# Patient Record
Sex: Male | Born: 1940 | Race: White | Hispanic: No | Marital: Married | Smoking: Former smoker
Health system: Southern US, Community
[De-identification: ages and names within clinical notes are randomized; demographics above are authoritative.]

## PROBLEM LIST (undated history)

## (undated) ENCOUNTER — Ambulatory Visit: Admission: EM | Disposition: A | Discharge status: Home / Self Care | Payer: Medicare Other

## (undated) DIAGNOSIS — R0789 Other chest pain: Secondary | ICD-10-CM

## (undated) DIAGNOSIS — I2510 Atherosclerotic heart disease of native coronary artery without angina pectoris: Secondary | ICD-10-CM

## (undated) DIAGNOSIS — Z974 Presence of external hearing-aid: Secondary | ICD-10-CM

## (undated) DIAGNOSIS — I10 Essential (primary) hypertension: Secondary | ICD-10-CM

## (undated) DIAGNOSIS — N529 Male erectile dysfunction, unspecified: Secondary | ICD-10-CM

## (undated) DIAGNOSIS — Z860100 Personal history of colon polyps, unspecified: Secondary | ICD-10-CM

## (undated) DIAGNOSIS — Z8371 Family history of colonic polyps: Secondary | ICD-10-CM

## (undated) DIAGNOSIS — Z951 Presence of aortocoronary bypass graft: Secondary | ICD-10-CM

## (undated) DIAGNOSIS — I679 Cerebrovascular disease, unspecified: Secondary | ICD-10-CM

## (undated) DIAGNOSIS — Z803 Family history of malignant neoplasm of breast: Secondary | ICD-10-CM

## (undated) DIAGNOSIS — Z800 Family history of malignant neoplasm of digestive organs: Secondary | ICD-10-CM

## (undated) DIAGNOSIS — I739 Peripheral vascular disease, unspecified: Secondary | ICD-10-CM

## (undated) DIAGNOSIS — I77810 Thoracic aortic ectasia: Secondary | ICD-10-CM

## (undated) DIAGNOSIS — K219 Gastro-esophageal reflux disease without esophagitis: Secondary | ICD-10-CM

## (undated) DIAGNOSIS — Z83719 Family history of colon polyps, unspecified: Secondary | ICD-10-CM

## (undated) DIAGNOSIS — I482 Chronic atrial fibrillation, unspecified: Secondary | ICD-10-CM

## (undated) DIAGNOSIS — E785 Hyperlipidemia, unspecified: Secondary | ICD-10-CM

## (undated) DIAGNOSIS — G4730 Sleep apnea, unspecified: Secondary | ICD-10-CM

## (undated) DIAGNOSIS — I491 Atrial premature depolarization: Secondary | ICD-10-CM

## (undated) DIAGNOSIS — R011 Cardiac murmur, unspecified: Secondary | ICD-10-CM

## (undated) DIAGNOSIS — E7800 Pure hypercholesterolemia, unspecified: Secondary | ICD-10-CM

## (undated) DIAGNOSIS — G629 Polyneuropathy, unspecified: Secondary | ICD-10-CM

## (undated) DIAGNOSIS — I779 Disorder of arteries and arterioles, unspecified: Secondary | ICD-10-CM

## (undated) DIAGNOSIS — Z972 Presence of dental prosthetic device (complete) (partial): Secondary | ICD-10-CM

## (undated) DIAGNOSIS — I6529 Occlusion and stenosis of unspecified carotid artery: Secondary | ICD-10-CM

## (undated) DIAGNOSIS — I509 Heart failure, unspecified: Secondary | ICD-10-CM

## (undated) HISTORY — PX: COLONOSCOPY W/ POLYPECTOMY: SHX1380

## (undated) HISTORY — DX: Presence of aortocoronary bypass graft: Z95.1

## (undated) HISTORY — DX: Chronic atrial fibrillation, unspecified: I48.20

## (undated) HISTORY — PX: ANGIOPLASTY: SHX39

## (undated) HISTORY — DX: Peripheral vascular disease, unspecified: I73.9

## (undated) HISTORY — DX: Personal history of colonic polyps: Z86.010

## (undated) HISTORY — PX: KNEE ARTHROSCOPY W/ PARTIAL MEDIAL MENISCECTOMY: SHX1882

## (undated) HISTORY — PX: MRI: SHX5353

## (undated) HISTORY — PX: HERPES SIMPLEX VIRUS DFA: LAB15028

## (undated) HISTORY — DX: Family history of colonic polyps: Z83.71

## (undated) HISTORY — DX: Cerebrovascular disease, unspecified: I67.9

## (undated) HISTORY — PX: UVULOPALATOPHARYNGOPLASTY: SHX827

## (undated) HISTORY — DX: Disorder of arteries and arterioles, unspecified: I77.9

## (undated) HISTORY — DX: Essential (primary) hypertension: I10

## (undated) HISTORY — DX: Personal history of colon polyps, unspecified: Z86.0100

## (undated) HISTORY — DX: Other chest pain: R07.89

## (undated) HISTORY — PX: OTHER SURGICAL HISTORY: SHX169

## (undated) HISTORY — DX: Hyperlipidemia, unspecified: E78.5

## (undated) HISTORY — DX: Pure hypercholesterolemia, unspecified: E78.00

## (undated) HISTORY — DX: Family history of malignant neoplasm of breast: Z80.3

## (undated) HISTORY — DX: Thoracic aortic ectasia: I77.810

## (undated) HISTORY — DX: Cardiac murmur, unspecified: R01.1

## (undated) HISTORY — DX: Family history of malignant neoplasm of digestive organs: Z80.0

## (undated) HISTORY — DX: Atherosclerotic heart disease of native coronary artery without angina pectoris: I25.10

## (undated) HISTORY — PX: APPENDECTOMY: SHX54

## (undated) HISTORY — DX: Family history of colon polyps, unspecified: Z83.719

## (undated) HISTORY — DX: Sleep apnea, unspecified: G47.30

## (undated) HISTORY — DX: Gastro-esophageal reflux disease without esophagitis: K21.9

## (undated) HISTORY — DX: Atrial premature depolarization: I49.1

## (undated) HISTORY — DX: Occlusion and stenosis of unspecified carotid artery: I65.29

## (undated) HISTORY — DX: Male erectile dysfunction, unspecified: N52.9

---

## 1996-12-02 HISTORY — PX: CORONARY ARTERY BYPASS GRAFT: SHX141

## 2000-12-02 HISTORY — PX: CARDIAC CATHETERIZATION: SHX172

## 2004-04-01 HISTORY — PX: ESOPHAGOGASTRODUODENOSCOPY: SHX1529

## 2004-10-11 ENCOUNTER — Encounter: Payer: Self-pay | Admitting: Internal Medicine

## 2004-12-28 ENCOUNTER — Ambulatory Visit: Payer: Self-pay | Admitting: Internal Medicine

## 2005-01-28 ENCOUNTER — Ambulatory Visit: Payer: Self-pay | Admitting: Internal Medicine

## 2005-03-26 ENCOUNTER — Ambulatory Visit: Payer: Self-pay | Admitting: Internal Medicine

## 2005-03-27 ENCOUNTER — Ambulatory Visit: Payer: Self-pay | Admitting: Internal Medicine

## 2005-06-27 ENCOUNTER — Ambulatory Visit: Payer: Self-pay | Admitting: Internal Medicine

## 2005-07-01 ENCOUNTER — Ambulatory Visit: Payer: Self-pay | Admitting: Internal Medicine

## 2005-07-11 ENCOUNTER — Ambulatory Visit: Payer: Self-pay | Admitting: Internal Medicine

## 2005-10-14 ENCOUNTER — Ambulatory Visit: Payer: Self-pay | Admitting: Family Medicine

## 2005-11-01 ENCOUNTER — Ambulatory Visit: Payer: Self-pay | Admitting: Internal Medicine

## 2005-11-12 ENCOUNTER — Ambulatory Visit: Payer: Self-pay | Admitting: Internal Medicine

## 2006-03-18 ENCOUNTER — Ambulatory Visit: Payer: Self-pay | Admitting: Internal Medicine

## 2006-04-01 ENCOUNTER — Ambulatory Visit: Payer: Self-pay

## 2006-05-01 ENCOUNTER — Ambulatory Visit: Payer: Self-pay | Admitting: Internal Medicine

## 2006-05-09 ENCOUNTER — Ambulatory Visit: Payer: Self-pay | Admitting: Internal Medicine

## 2006-06-24 ENCOUNTER — Ambulatory Visit: Payer: Self-pay | Admitting: Pulmonary Disease

## 2006-06-24 ENCOUNTER — Encounter: Payer: Self-pay | Admitting: Internal Medicine

## 2006-07-01 ENCOUNTER — Encounter: Payer: Self-pay | Admitting: Internal Medicine

## 2006-07-01 ENCOUNTER — Ambulatory Visit: Payer: Self-pay | Admitting: Gastroenterology

## 2006-07-30 ENCOUNTER — Ambulatory Visit: Payer: Self-pay | Admitting: Pulmonary Disease

## 2006-07-30 ENCOUNTER — Ambulatory Visit (HOSPITAL_BASED_OUTPATIENT_CLINIC_OR_DEPARTMENT_OTHER): Admission: RE | Admit: 2006-07-30 | Discharge: 2006-07-30 | Payer: Self-pay | Admitting: Pulmonary Disease

## 2006-08-20 ENCOUNTER — Ambulatory Visit: Payer: Self-pay | Admitting: Pulmonary Disease

## 2006-08-27 ENCOUNTER — Ambulatory Visit: Payer: Self-pay | Admitting: Internal Medicine

## 2006-11-07 ENCOUNTER — Ambulatory Visit: Payer: Self-pay | Admitting: Internal Medicine

## 2006-11-10 ENCOUNTER — Ambulatory Visit: Payer: Self-pay | Admitting: Internal Medicine

## 2006-11-11 ENCOUNTER — Ambulatory Visit: Payer: Self-pay | Admitting: Internal Medicine

## 2007-02-11 ENCOUNTER — Encounter: Payer: Medicare Other | Admitting: Internal Medicine

## 2007-03-03 ENCOUNTER — Encounter: Payer: Medicare Other | Admitting: Internal Medicine

## 2007-04-02 ENCOUNTER — Encounter: Payer: Medicare Other | Admitting: Internal Medicine

## 2007-05-03 ENCOUNTER — Encounter: Payer: Medicare Other | Admitting: Internal Medicine

## 2007-05-11 DIAGNOSIS — I10 Essential (primary) hypertension: Secondary | ICD-10-CM | POA: Insufficient documentation

## 2007-05-11 DIAGNOSIS — I2510 Atherosclerotic heart disease of native coronary artery without angina pectoris: Secondary | ICD-10-CM | POA: Insufficient documentation

## 2007-05-11 DIAGNOSIS — G4730 Sleep apnea, unspecified: Secondary | ICD-10-CM

## 2007-05-11 DIAGNOSIS — Z860100 Personal history of colon polyps, unspecified: Secondary | ICD-10-CM | POA: Insufficient documentation

## 2007-05-11 DIAGNOSIS — F528 Other sexual dysfunction not due to a substance or known physiological condition: Secondary | ICD-10-CM

## 2007-05-11 DIAGNOSIS — K219 Gastro-esophageal reflux disease without esophagitis: Secondary | ICD-10-CM | POA: Insufficient documentation

## 2007-05-11 DIAGNOSIS — E785 Hyperlipidemia, unspecified: Secondary | ICD-10-CM

## 2007-05-11 HISTORY — DX: Atherosclerotic heart disease of native coronary artery without angina pectoris: I25.10

## 2007-05-11 HISTORY — DX: Essential (primary) hypertension: I10

## 2007-05-13 ENCOUNTER — Ambulatory Visit: Payer: Self-pay | Admitting: Internal Medicine

## 2007-05-14 LAB — CONVERTED CEMR LAB
ALT: 21 units/L (ref 0–40)
Albumin: 3.5 g/dL (ref 3.5–5.2)
BUN: 8 mg/dL (ref 6–23)
Calcium: 9.3 mg/dL (ref 8.4–10.5)
GFR calc Af Amer: 109 mL/min
LDL Cholesterol: 72 mg/dL (ref 0–99)
Potassium: 4.6 meq/L (ref 3.5–5.1)
VLDL: 14 mg/dL (ref 0–40)

## 2007-06-02 ENCOUNTER — Encounter: Payer: Medicare Other | Admitting: Internal Medicine

## 2007-07-06 ENCOUNTER — Ambulatory Visit: Payer: Self-pay | Admitting: Internal Medicine

## 2007-08-28 ENCOUNTER — Telehealth (INDEPENDENT_AMBULATORY_CARE_PROVIDER_SITE_OTHER): Payer: Self-pay | Admitting: *Deleted

## 2007-08-31 ENCOUNTER — Telehealth: Payer: Self-pay | Admitting: Internal Medicine

## 2007-11-12 ENCOUNTER — Ambulatory Visit: Payer: Self-pay | Admitting: Internal Medicine

## 2007-11-12 DIAGNOSIS — B009 Herpesviral infection, unspecified: Secondary | ICD-10-CM | POA: Insufficient documentation

## 2007-11-13 ENCOUNTER — Ambulatory Visit: Payer: Self-pay | Admitting: Internal Medicine

## 2007-11-16 LAB — CONVERTED CEMR LAB
ALT: 23 units/L (ref 0–53)
Alkaline Phosphatase: 97 units/L (ref 39–117)
CO2: 31 meq/L (ref 19–32)
Chloride: 106 meq/L (ref 96–112)
Cholesterol: 102 mg/dL (ref 0–200)
GFR calc Af Amer: 96 mL/min
Glucose, Bld: 95 mg/dL (ref 70–99)
Sodium: 143 meq/L (ref 135–145)
Total Bilirubin: 1.9 mg/dL — ABNORMAL HIGH (ref 0.3–1.2)
Total Protein: 6.6 g/dL (ref 6.0–8.3)

## 2008-01-13 ENCOUNTER — Ambulatory Visit: Payer: Self-pay | Admitting: Internal Medicine

## 2008-01-22 ENCOUNTER — Ambulatory Visit: Payer: Self-pay

## 2008-01-22 ENCOUNTER — Encounter: Payer: Self-pay | Admitting: Internal Medicine

## 2008-02-03 ENCOUNTER — Telehealth: Payer: Self-pay | Admitting: Internal Medicine

## 2008-07-08 ENCOUNTER — Ambulatory Visit: Payer: Self-pay | Admitting: Internal Medicine

## 2008-07-27 ENCOUNTER — Ambulatory Visit: Payer: Self-pay

## 2008-07-27 ENCOUNTER — Encounter: Payer: Self-pay | Admitting: Internal Medicine

## 2008-07-27 ENCOUNTER — Ambulatory Visit: Payer: Self-pay | Admitting: Internal Medicine

## 2008-07-27 LAB — CONVERTED CEMR LAB
Albumin: 4.2 g/dL (ref 3.5–5.2)
Alkaline Phosphatase: 110 units/L (ref 39–117)
BUN: 13 mg/dL (ref 6–23)
CO2: 28 meq/L (ref 19–32)
Calcium: 9.4 mg/dL (ref 8.4–10.5)
Cholesterol: 115 mg/dL (ref 0–200)
Glucose, Bld: 95 mg/dL (ref 70–99)
HDL: 44 mg/dL (ref >39)
LDL Cholesterol: 48 mg/dL (ref 0–99)
Potassium: 4.5 meq/L (ref 3.5–5.3)
Triglycerides: 113 mg/dL (ref <150)

## 2008-11-08 DIAGNOSIS — E7800 Pure hypercholesterolemia, unspecified: Secondary | ICD-10-CM

## 2008-11-08 DIAGNOSIS — I2510 Atherosclerotic heart disease of native coronary artery without angina pectoris: Secondary | ICD-10-CM | POA: Insufficient documentation

## 2008-11-08 HISTORY — DX: Pure hypercholesterolemia, unspecified: E78.00

## 2008-11-08 HISTORY — DX: Atherosclerotic heart disease of native coronary artery without angina pectoris: I25.10

## 2008-11-17 DIAGNOSIS — I679 Cerebrovascular disease, unspecified: Secondary | ICD-10-CM

## 2008-11-17 HISTORY — DX: Cerebrovascular disease, unspecified: I67.9

## 2008-11-23 ENCOUNTER — Ambulatory Visit: Payer: Medicare Other | Admitting: Family Medicine

## 2008-12-05 ENCOUNTER — Ambulatory Visit: Payer: Medicare Other | Admitting: Family Medicine

## 2008-12-22 ENCOUNTER — Ambulatory Visit: Payer: Medicare Other | Admitting: Gastroenterology

## 2009-01-05 ENCOUNTER — Encounter: Admission: RE | Admit: 2009-01-05 | Discharge: 2009-01-05 | Payer: Self-pay | Admitting: Neurology

## 2009-02-08 ENCOUNTER — Encounter: Admission: RE | Admit: 2009-02-08 | Discharge: 2009-02-08 | Payer: Self-pay | Admitting: Neurology

## 2009-03-23 ENCOUNTER — Encounter: Payer: Self-pay | Admitting: Internal Medicine

## 2009-03-24 ENCOUNTER — Ambulatory Visit: Payer: Self-pay | Admitting: Internal Medicine

## 2009-03-24 DIAGNOSIS — M549 Dorsalgia, unspecified: Secondary | ICD-10-CM | POA: Insufficient documentation

## 2009-03-24 DIAGNOSIS — M545 Low back pain: Secondary | ICD-10-CM

## 2009-04-26 ENCOUNTER — Encounter (INDEPENDENT_AMBULATORY_CARE_PROVIDER_SITE_OTHER): Payer: Self-pay | Admitting: *Deleted

## 2009-07-24 ENCOUNTER — Telehealth: Payer: Self-pay | Admitting: Internal Medicine

## 2009-08-22 ENCOUNTER — Ambulatory Visit: Payer: Self-pay

## 2009-08-22 ENCOUNTER — Encounter: Payer: Self-pay | Admitting: Internal Medicine

## 2009-08-22 DIAGNOSIS — I6529 Occlusion and stenosis of unspecified carotid artery: Secondary | ICD-10-CM

## 2009-08-22 HISTORY — DX: Occlusion and stenosis of unspecified carotid artery: I65.29

## 2009-08-31 ENCOUNTER — Encounter: Payer: Self-pay | Admitting: Internal Medicine

## 2009-12-21 ENCOUNTER — Ambulatory Visit: Payer: Self-pay | Admitting: Internal Medicine

## 2009-12-21 DIAGNOSIS — R209 Unspecified disturbances of skin sensation: Secondary | ICD-10-CM | POA: Insufficient documentation

## 2009-12-21 DIAGNOSIS — N529 Male erectile dysfunction, unspecified: Secondary | ICD-10-CM | POA: Insufficient documentation

## 2009-12-25 ENCOUNTER — Ambulatory Visit: Payer: Self-pay

## 2009-12-25 ENCOUNTER — Encounter: Payer: Self-pay | Admitting: Internal Medicine

## 2010-01-02 DIAGNOSIS — Z951 Presence of aortocoronary bypass graft: Secondary | ICD-10-CM

## 2010-01-02 HISTORY — DX: Presence of aortocoronary bypass graft: Z95.1

## 2010-01-12 ENCOUNTER — Telehealth: Payer: Self-pay | Admitting: Gastroenterology

## 2010-05-06 ENCOUNTER — Emergency Department: Payer: Medicare Other | Admitting: Emergency Medicine

## 2010-08-22 ENCOUNTER — Encounter: Payer: Self-pay | Admitting: Internal Medicine

## 2010-08-23 ENCOUNTER — Ambulatory Visit: Payer: Self-pay

## 2010-08-23 ENCOUNTER — Encounter: Payer: Self-pay | Admitting: Internal Medicine

## 2010-08-31 ENCOUNTER — Encounter: Payer: Self-pay | Admitting: Internal Medicine

## 2010-09-19 ENCOUNTER — Ambulatory Visit: Payer: Self-pay | Admitting: Internal Medicine

## 2010-10-16 ENCOUNTER — Ambulatory Visit: Payer: Self-pay | Admitting: Internal Medicine

## 2010-10-24 ENCOUNTER — Telehealth: Payer: Self-pay | Admitting: Internal Medicine

## 2010-10-29 LAB — CONVERTED CEMR LAB
Albumin: 4.2 g/dL (ref 3.5–5.2)
Bilirubin, Direct: 0.4 mg/dL — ABNORMAL HIGH (ref 0.0–0.3)
HDL: 38 mg/dL — ABNORMAL LOW (ref >39)
LDL Cholesterol: 47 mg/dL (ref 0–99)
Total Bilirubin: 1.4 mg/dL — ABNORMAL HIGH (ref 0.3–1.2)
Total CHOL/HDL Ratio: 2.6
VLDL: 15 mg/dL (ref 0–40)

## 2011-01-01 NOTE — Miscellaneous (Signed)
Summary: Orders Update  Clinical Lists Changes  Orders: Added new Test order of Carotid Duplex (Carotid Duplex) - Signed 

## 2011-01-01 NOTE — Letter (Signed)
Summary: [place] [facility], agricultural at [address]   [place], [place] [address]   Phone: [phone]  Fax: [phone]      August 31, 2010 MRN: [id number]   [facility] [address] [place], [place]  [address]   Dear Mr. SILGUERO,  Your test ordered by Selena Batten has been reviewed by your physician (or physician assistant) and was found to be normal or stable. Your physician (or physician assistant) felt no changes were needed at this time.  ____ Echocardiogram  ____ Cardiac Stress Test  ____ Lab Work  __x__ Peripheral vascular study of arms, legs or neck  ____ CT scan or X-ray  ____ Lung or Breathing test  ____ Other:  You will need a carotid ultrasound in 1 year to follow up at letter will be sent to remind you.   Thank you.   Benedict Needy, RN    Arvilla Meres, MD, F.A.C.C

## 2011-01-01 NOTE — Progress Notes (Signed)
Summary: Change Practices.  Phone Note Outgoing Call Call back at [facility] Phone [phone]   Call placed by: Harlow Mares CMA Duncan Dull),  January 12, 2010 3:34 PM Call placed to: Patient Summary of Call: called patient to remind him about his colonoscopy he states that he has already had a colonoscopy in [place] about a year ago. I had Yesi but a note in [facility].  Initial call taken by: Harlow Mares CMA Duncan Dull),  January 12, 2010 3:34 PM

## 2011-01-01 NOTE — Assessment & Plan Note (Signed)
Summary: rov   Visit Type:  Follow-up Primary Provider:  Julieanne Manson, MD  CC:  no complaints.  History of Present Illness: Micheal Wright is a very pleasant 70 year old male with a history of coronary artery disease status post coronary artery bypass grafting in 1998.  Last catheterization was in January 2002 due to an abnormal stress test with 4 mm of ST elevation during exercise.  Cardiac catheterization showed patent grafts.  He has a history of hypertension, hyperlipidemia, and sleep apnea.  Returns for routine f/u.  In December 2009 slipped and fell off trailer as he was loading it. Stiill having problems with hands and feet tingling so not exercising much. No clear claudication Occasional mild CP and SOB. No real change.   Had lipids checked recently and "looked good." having problems with erectile dysfunction.    Current Medications (verified): 1)  Toprol Xl 25 Mg Tb24 (Metoprolol Succinate) .... Take 1 Tablet By Mouth Once A Day 2)  Diovan 80 Mg Tabs (Valsartan) .Marland Kitchen.. 1 Tablet By Mouth Once A Day 3)  Lipitor 80 Mg Tabs (Atorvastatin Calcium) .... Take One By Mouth Daily 4)  Fiber Laxative .... Daily 5)  Niaspan 1000 Mg Cr-Tabs (Niacin (Antihyperlipidemic)) .... 2 Tabs By Mouth At Bedtime 6)  Muse 1000 Mcg  Pllt (Alprostadil (Vasodilator)) .... As Needed 7)  Aciphex 20 Mg Tbec (Rabeprazole Sodium) .... Take 1 Tablet By Mouth Once Daily 8)  Nitroglycerin 0.4 Mg Subl (Nitroglycerin) .... One Tablet Under Tongue Every 5 Minutes As Needed For Chest Pain---May Repeat Times Three  Allergies (verified): No Known Drug Allergies  Past History:  Past Medical History: Colonic polyps, hx of Coronary artery disease s/p CABG 1998     --last cath 2002 in setting of marked ST elevation during stress testing. patent grafts     --ECHO 8/09 EF 55%. No significant valvualr disease Carotid artery disease    --u/s 9/10: R 40-59% L 0-39% GERD Hyperlipidemia Hypertension Erectile dysfunction Sleep  apnea  [facility] Dr Jones Broom  Review of Systems       As per HPI and past medical history; otherwise all systems negative.   Vital Signs:  Patient profile:   70 year old male Height:      70 inches Weight:      214 pounds BMI:     30.82 Pulse rate:   58 / minute Pulse rhythm:   regular BP sitting:   122 / 70  (left arm) Cuff size:   regular  Vitals Entered By: Mercer Pod (December 21, 2009 10:27 AM)  Physical Exam  General:  Gen: well appearing. no resp difficulty HEENT: normal Neck: supple. no JVD. Carotids 2+ bilat; no bruits.  Cor: PMI nondisplaced. Regular rate & rhythm. No rubs, gallops 2/6 systolic murmur RSB. s2 well preserved Lungs: clear Abdomen: soft, nontender, nondistended. Peri Jefferson bowel sounds. Extremities: no cyanosis, clubbing, rash, edema Neuro: alert & orientedx3, cranial nerves grossly DPs 1+ bilat PTs 2+ bilat moves all 4 extremities w/o difficulty. affect pleasant     Impression & Recommendations:  Problem # 1:  CORONARY ARTERY DISEASE (ICD-414.00) Stable. No evidence of ischemia. Continue current regimen.  Problem # 2:  HYPERTENSION (ICD-401.9) Blood pressure well controlled. Continue current regimen.  Problem # 3:  HYPERLIPIDEMIA (ICD-272.4) Followed by Dr. Sullivan Lone. Goal LDL < 70. Continue current regimen.  Problem # 4:  DISTURBANCE OF SKIN SENSATION (ICD-782.0) Suspect this is neurally mediated by peripheral pulses are dimished. will check exercise ABIs.  Problem # 5:  ERECTILE DYSFUNCTION, ORGANIC (ICD-607.84) Start viagra. Told to absolutely avoid NTG with viagra.  Other Orders: Arterial Duplex Lower Extremity (Arterial Duplex Low)  Patient Instructions: 1)  Your physician recommends that you schedule a follow-up appointment in: 6 months 2)  Your physician has recommended you make the following change in your medication: viagra 100 mg 1/2 tab as needed 3)  Your physician has requested that you have an ankle brachial index  (ABI). During this test an ultrasound and blood pressure cuff are used to evaluate the arteries that supply the arms and legs with blood. Allow thirty minutes for this exam. There are no restrictions or special instructions. Prescriptions: VIAGRA 100 MG TABS (SILDENAFIL CITRATE) take 1/2 tab by mouth as needed  #10 x 6   Entered by:   Charlena Cross, RN, BSN   Authorized by:   Dolores Patty, MD, [facility]   Signed by:   Charlena Cross, RN, BSN on 12/21/2009   Method used:   Electronically to        [facility]* (retail)       [address]       [place], [place]  [address]       Ph: [phone]       Fax: [phone]   RxID:   [id number]

## 2011-01-01 NOTE — Progress Notes (Signed)
Summary: Labs  Phone Note Call from Patient Call back at Home Phone [phone]   Caller: SELF Call For: BENSIMHON Summary of Call: Pt would like lab results from last week. Initial call taken by: Harlon Flor,  October 24, 2010 9:44 AM  Follow-up for Phone Call        Dr Gala Romney has not reviewed labs yet.  Once reviewed will call pt with results. Attempted TCB pt.  LMOM TCB. Cloyde Reams RN  October 24, 2010 10:25 AM   Spoke with pt's wife advised we will call pt with results after Dr Gala Romney has reviewed. Follow-up by: Cloyde Reams RN,  October 24, 2010 12:09 PM

## 2011-01-01 NOTE — Assessment & Plan Note (Signed)
Summary: 6 month   Visit Type:  Follow-up Primary Provider:  Julieanne Manson, MD  CC:  Denies chest pain or shortness of breath..  History of Present Illness: Micheal Wright is a very pleasant 70 year old male with a history of coronary artery disease status post coronary artery bypass grafting in 05-10-1997.  Last catheterization was in January 2002 due to an abnormal stress test with 4 mm of ST elevation during exercise.  Cardiac catheterization showed patent grafts.  He has a history of hypertension, hyperlipidemia, and sleep apnea. Myoview 2/09 EF 61% No sichemia.   Returns for routine f/u. Remains active cutting lawns and other chores. No CP or SOB. No neuro symptoms.    Had lipids checked earlier this year and "looked good."  Carotid u/s 9/11  R 40-59% L 0-39% (stable) ABI 1/11  R 1.0  L 1.1      Current Medications (verified): 1)  Toprol Xl 25 Mg Tb24 (Metoprolol Succinate) .... Take 1 Tablet By Mouth Once A Day 2)  Diovan 80 Mg Tabs (Valsartan) .Marland Kitchen.. 1 Tablet By Mouth Once A Day 3)  Lipitor 80 Mg Tabs (Atorvastatin Calcium) .... Take One By Mouth Daily 4)  Fiber Laxative .... Daily 5)  Niaspan 1000 Mg Cr-Tabs (Niacin (Antihyperlipidemic)) .... 2 Tabs By Mouth At Bedtime 6)  Muse 1000 Mcg  Pllt (Alprostadil (Vasodilator)) .... As Needed 7)  Aciphex 20 Mg Tbec (Rabeprazole Sodium) .... Take 1 Tablet By Mouth Once Daily 8)  Nitroglycerin 0.4 Mg Subl (Nitroglycerin) .... One Tablet Under Tongue Every 5 Minutes As Needed For Chest Pain---May Repeat Times Three 9)  Viagra 100 Mg Tabs (Sildenafil Citrate) .... Take 1/2 Tab By Mouth As Needed  Allergies (verified): No Known Drug Allergies  Past History:  Past Medical History: Last updated: 12/21/2009 Colonic polyps, hx of Coronary artery disease s/p CABG 05/10/97     --last cath May 10, 2001 in setting of marked ST elevation during stress testing. patent grafts     --ECHO 8/09 EF 55%. No significant valvualr disease Carotid artery disease    --u/s  9/10: R 40-59% L 0-39% GERD Hyperlipidemia Hypertension Erectile dysfunction Sleep apnea  [facility] Dr Jones Broom  Past Surgical History: Last updated: 2007/06/08 Appendectomy 1960's Coronary artery bypass graft 05/10/97 Cath 10-May-2001 Left knee- partial meniscectomy 1990's EGD-- inflam. at Z line (bx. negative) 05/05  Family History: Last updated: 06/08/2007 Father: Died at age 60, MI Mother:Died at age 91, CVA  Siblings: One brother died at age 39, rheumatic heart disease, one alive               One sister, deceased, heart surgery in May 11, 2023, breast cancer No HTN, DM No prostate or colon cancer  Social History: Last updated: June 08, 2007 Marital Status: Married Children: 3 Occupation: Retired, Airline pilot- [employer] Never Smoked Alcohol use-no  Risk Factors: Smoking Status: never (2007-06-08)  Review of Systems       As per HPI and past medical history; otherwise all systems negative.   Vital Signs:  Patient profile:   70 year old male Height:      70 inches Weight:      213 pounds BMI:     30.67 Pulse rate:   66 / minute BP sitting:   130 / 72  (left arm) Cuff size:   regular  Vitals Entered By: Bishop Dublin, CMA (September 19, 2010 10:52 AM)  Physical Exam  General:  Well appearing. no resp difficulty HEENT: normal Neck: supple. no JVD. Carotids 2+ bilat; no bruits.  Cor: PMI nondisplaced. Regular rate & rhythm. No rubs, gallops 2/6 systolic murmur RSB. s2 well preserved Lungs: clear Abdomen: soft, nontender, nondistended. Peri Jefferson bowel sounds. Extremities: no cyanosis, clubbing, rash, edema Neuro: alert & orientedx3, cranial nerves grossly DPs 1+ bilat PTs 2+ bilat moves all 4 extremities w/o difficulty. affect pleasant    Impression & Recommendations:  Problem # 1:  CORONARY ARTERY DISEASE (ICD-414.00) Stable. No evidence of ischemia. Continue current regimen.  Problem # 2:  HYPERTENSION (ICD-401.9) Blood pressure well controlled. Continue current  regimen.  Problem # 3:  CAROTID ARTERY DISEASE (ICD-433.10) Stable. Asymptomatic. Continue routine schedule surveillance.  Problem # 4:  HYPERLIPIDEMIA (ICD-272.4) Due for screening. Goal LDL < 70. Continue current regimen check labs tomorrow (fasting). will forward to Dr. Sullivan Lone as well.   Patient Instructions: 1)  Your physician recommends that you continue on your current medications as directed. Please refer to the Current Medication list given to you today. 2)  Your physician wants you to follow-up in:  6 months  You will receive a reminder letter in the mail two months in advance. If you don't receive a letter, please call our office to schedule the follow-up appointment.

## 2011-03-09 ENCOUNTER — Other Ambulatory Visit: Payer: Self-pay | Admitting: *Deleted

## 2011-03-09 MED ORDER — METOPROLOL SUCCINATE ER 25 MG PO TB24: 25.0000 mg | ORAL_TABLET | Freq: Every day | ORAL | Status: DC

## 2011-03-20 ENCOUNTER — Other Ambulatory Visit: Payer: Self-pay | Admitting: *Deleted

## 2011-03-20 MED ORDER — ATORVASTATIN CALCIUM 80 MG PO TABS: 80.0000 mg | ORAL_TABLET | Freq: Every day | ORAL | Status: DC

## 2011-04-16 NOTE — Assessment & Plan Note (Signed)
[facility] OFFICE NOTE   TREGAN, READ                          MRN:          [id number]  DATE:01/13/2008                            DOB:          06-Oct-1941    PRIMARY CARE PHYSICIAN:  Arta Silence, MD.   INTERVAL HISTORY:  Mr. Stillinger is a very pleasant 70 year old male with a  history of coronary artery disease status post coronary bypass grafting  in 1998.  Last cardiac catheterization was in January 2002, due to an  abnormal stress test with 4 mm of ST elevation during exercise  catheterization, showed a patent grafts.  He also has a history of  hypertension, hyperlipidemia and sleep apnea.  He returns today for  routine follow-up.   He is doing well.  Over the winter he was less active and now is getting  back to his walking program.  However, he has started very briskly and  has actually been trying to do some jogging on the treadmill.  He has  not had any chest pain with it but does note that he gets some dyspnea  with this and also walking up some stairs.  He is planning to train for  the [employer] in May.   Remainder of his medical history is notable for hypertension,  hyperlipidemia with a low HDL and sleep apnea but he is intolerant of  CPAP.   CURRENT MEDICATIONS:  1. Aciphex 20 mg a day.  2. Aspirin 81.  3. FiberCon.  4. Metoprolol 12.5 b.i.d.  5. Lipitor 80 a day.  6. Diovan 80 a day.  7. Niaspan 2000 mg a day.   PHYSICAL EXAMINATION:  GENERAL APPEARANCE:  He is well-appearing.  No  acute distress.  He ambulates around the clinic without any respiratory  difficulty.  VITAL SIGNS:  Blood pressure is initially 104/64, manual recheck 134/70,  heart rate 60, weight is 217 which is up 11 pounds.  HEENT:  Normal.  NECK:  Supple.  No JVD.  Carotid 2+ bilaterally without any bruits.  There is no lymphadenopathy or thyromegaly.  CARDIAC:  PMI is nondisplaced.  He has regular rate and  rhythm with a  soft systolic ejection murmur at the right sternal border and an S4.  LUNGS:  Clear.  ABDOMEN:  Soft, nontender, nondistended, no hepatosplenomegaly, no  bruits, no masses.  Good bowel sounds.  EXTREMITIES:  Warm with no cyanosis, clubbing or edema.  No rash.  NEUROLOGIC:  Alert and oriented x3.  Cranial nerves II-XII are intact.  Moves all four extremities without difficulty.  Affect is pleasant.   EKG shows normal sinus rhythm with occasional PVC.  No ST-T wave  changes.  Heart rate is 60.   ASSESSMENT/PLAN:  1. Coronary artery disease is stable without any evidence of ischemia.  2. Hypertension, well controlled.  3. Hyperlipidemia, HDL continues to be just a little bit low but is      getting better.  We discussed the possibility of fish oil but he      has been intolerant  of this in the past.  His LDL is under 70.  He      will continue to follow with Dr. Hetty Ely.  4. Dyspnea.  I suspect this is just deconditioning but given that fact      that his cardiac catheterization has been over 7 years ago and he      is planning to very much increase his exercise regimen, I have      suggested we get a treadmill Myoview to further evaluate him.   DISPOSITION:  Will see him back in several months.     Bevelyn Buckles. Bensimhon, MD  Electronically Signed    DRB/MedQ  DD: 01/13/2008  DT: 01/14/2008  Job #: [id number]   cc:   Arta Silence, MD

## 2011-04-16 NOTE — Assessment & Plan Note (Signed)
[facility] OFFICE NOTE   Micheal Wright, Micheal Wright                          MRN:          [id number]  DATE:07/08/2008                            DOB:          July 28, 1941    PRIMARY CARE PHYSICIAN:  Karie Schwalbe, MD, but he is now switching  to Dr. Julieanne Manson.   INTERVAL HISTORY:  Micheal Wright is a very pleasant 70 year old male with a  history of coronary artery disease status post coronary artery bypass  grafting in 1998.  Last catheterization was in January 2002 due to an  abnormal stress test with 4 mm of ST elevation during exercise.  Cardiac  catheterization showed patent grafts.  He has a history of hypertension,  hyperlipidemia, and sleep apnea.  He returns today for routine followup.   He is doing great.  He has been very active, he was training for the  [employer], but had some injuries.  He has now gone from walking every  day [facility]  He has no  symptoms with this.  He does note that he has a hard time getting his  heart rate over 100, but he has really pushed to do this.  He had his  exercise Myoview in February.  He had significant ST depression  inferolaterally, but the perfusion images are totally normal with an EF  of 61% consistent with false-positive EKG.   CURRENT MEDICATIONS:  1. Aciphex 20 a day.  2. Aspirin 81 a day.  3. FiberCon.  4. Lipitor 80 a day.  5. Diovan 80 a day.  6. Niaspan 2 g a day.  7. Metoprolol 25 a day.   PHYSICAL EXAMINATION:  GENERAL:  Well appearing, in no acute distress,  ambulatory around the clinic, without any respiratory difficulty.  VITAL SIGNS:  Blood pressure is 114/62, heart rate is 57, weight is 203,  which is down 14 pounds.  HEENT:  Normal.  NECK:  Supple.  No JVD.  Carotids are 2+ bilaterally.  Question of a  faint bruit on the right versus radiated off the aortic valve.  There is  no lymphadenopathy or  thyromegaly.  CARDIAC:  Regular rate and rhythm with an S4 and a soft 2/6 systolic  ejection murmur at the right sternal border.  S2 is well preserved.  PMI  is nondisplaced.  LUNGS:  Clear.  ABDOMEN:  Soft, nontender, and nondistended.  No hepatosplenomegaly.  No  bruits.  No masses.  Good bowel sounds.  EXTREMITIES:  Warm with no cyanosis, clubbing, or edema.  No rash.  NEURO:  Alert and oriented x3.  Cranial nerves II-XII are intact.  Moves  all 4 extremities without difficulty.  Affect is pleasant.   ASSESSMENT AND PLAN:  1. Coronary artery disease.  He is status post previous bypass.  This      is stable.  No evidence of ischemia.  Stress test is reassuring.      Continue current therapy.  2. Hyperlipidemia.  He is due for  repeat lipids.  We will check these.      Continue current therapy.  3. Aortic valve murmur.  I suspect, he has some mild aortic sclerosis.      We will check an echocardiogram from baseline.  4. Possible right carotid bruit.  Also, I will check carotid      ultrasound.   DISPOSITION:  I will see him back in the clinic in 4-6 months for a  routine followup.     Bevelyn Buckles. Bensimhon, MD  Electronically Signed    DRB/MedQ  DD: 07/08/2008  DT: 07/09/2008  Job #: [id number]

## 2011-04-16 NOTE — Assessment & Plan Note (Signed)
Micheal Wright, Micheal Wright                          MRN:          [id number]  DATE:07/06/2007                            DOB:          04/21/1941    INTERVAL HISTORY:  Micheal Wright is a delightful 70 year old male with a  history of coronary artery disease status post coronary artery bypass  grafting in 1998. Last catheterization in January of 2002 due to an  abnormal stress test with 4 mm of ST elevation during exercise.  Catheterization showed patent grafts. Also history of hypertension,  hyperlipidemia and sleep apnea. He returns today for routine followup.  He is doing well. He remains very active. He is mowing the grass and  doing yard work three times a week. He does some swimming in his  backyard. He previously was going to cardiac rehabilitation but stopped  this several months ago. He has not had any chest pain or shortness of  breath. He had lost about 10 pounds but put about five of those back. He  is not in interested in using CPAP mask at this time.   CURRENT MEDICATIONS:  Include:  1. Aciphex 20 a day.  2. Aspirin 81 a day.  3. Fibercon.  4. Toprol-XL 25 a day.  5. Lipitor 80 a day.  6. Diovan 80 a day.  7. Niaspan 1500 a day.   PHYSICAL EXAMINATION:  He is well appearing. He is vigorous and  ambulates around the clinic without any respiratory difficulty. Blood  pressure is 116/60, heart rate 54, weight 206.  HEENT:  Is normal.  NECK:  Is supple. There is no JVD. Carotids are 2+ bilaterally without  any bruits. There is no lymphadenopathy or thyromegaly.  CARDIAC:  PMI is nondisplaced. He is regular and bradycardic with a S4,  soft systolic ejection murmur at the right sternal border.  LUNGS:  Are clear.  ABDOMEN:  Soft, nontender, nondistended. No hepatosplenomegaly. No  bruits, no masses appreciated. Good bowel sounds.  EXTREMITIES:  Warm with no clubbing, cyanosis, or edema.  NEUROLOGICAL:  Alert and oriented x3. Cranial nerves II-XII are intact.  Moves all four extremities without difficulty. Affect is pleasant.   EKG shows sinus bradycardia at a rate of 54. A RSR prime. No significant  ST-T wave abnormalities.   Most recent lipids from June show a total cholesterol of 119, HDL of 33  which is up from 31, LDL of 72.   ASSESSMENT AND PLAN:  1. Coronary artery disease. This is stable without any evidence of      ischemia.  2. Hypertension. Well controlled.  3. Hyperlipidemia. Continue Lipitor. Increase Niaspan to 2000. We are      hopeful to get his HDL up to near 40 and keep his LDL under 70.  4. Risk factor modification. Continue to remind him of the need to      participate in a regular walking program of 40 to 50 minutes a day.  5. Disposition. Return to clinic in 6 months for routine followup.  Bevelyn Buckles. Bensimhon, MD  Electronically Signed    DRB/MedQ  DD: 07/06/2007  DT: 07/06/2007  Job #: [id number]

## 2011-04-19 NOTE — Assessment & Plan Note (Signed)
[facility]                               PULMONARY OFFICE NOTE   REAKWON, BARREN                          MRN:          [id number]  DATE:08/20/2006                            DOB:          [birth year]    I saw Mr. Domke in followup today after he had undergone his overnight  polysomnogram. This was actually a split-night study. During the diagnostic  portion of the study, he was found to have an apnea hypopnea index of 15  consistent with moderate obstructive sleep apnea and an oxygen saturation  nadir of 79%. During the therapeutic portion of the study, he was titrated  to a CPAP pressure setting of 90 cmH2O with a reduction in his apnea  hypopnea index to 1.7. I reviewed the results of the sleep study with him in  detail. I had discussed other treatment options besides CPAP therapy,  __________ compliance and surgical intervention. I had discussed with him  that given the severity of his sleep apnea as well as the severity of his  oxygen desaturation in light of his history of heart disease and  hypertension that CPAP therapy would be his best initial option for trying  to treat his sleep apnea. I discussed various techniques in regards to  acclimatizing him to the use of CPAP therapy. He is agreeable to undergo  CPAP therapy at this time. What I will do is initiate him on CPAP at 9 cmH2O  with heated humidification and then I plan on following up with him in  approximately 8 weeks to assess his tolerance and compliance with CPAP  therapy.                                   Coralyn Helling, MD   VS/MedQ  DD:  08/20/2006  DT:  08/22/2006  Job #:  [id number]   cc:   Karie Schwalbe, MD

## 2011-04-19 NOTE — Procedures (Signed)
NAME:  Micheal Wright, Micheal Wright                   ACCOUNT NO.:  [id number]   MEDICAL RECORD NO.:  [id number]          PATIENT TYPE:  OUT   LOCATION:  SLEEP CENTER                 FACILITY:  [facility]   PHYSICIAN:  Coralyn Helling, MD        DATE OF BIRTH:  July 11, 1941   DATE OF STUDY:  07/30/2006                              NOCTURNAL POLYSOMNOGRAM   REFERRING PHYSICIAN:  Dr. Coralyn Helling.   INDICATION FOR STUDY:  This is an individual who has daytime fatigue,  snoring and excessive daytime sleepiness as well as a history of  hypertension.  He has a history of hypertension and coronary artery disease.  He is referred to the sleep lab for evaluation of obstructive sleep apnea.   MEDICATIONS:  AcipHex, aspirin, Lipitor, Fibercon, Diovan, Niaspan and  Toprol XL.   EPWORTH SLEEPINESS SCORE:  17.   SLEEP ARCHITECTURE:  Total recording time was 400 minutes.  Total sleep time  was 329.5 minutes.  Sleep efficiency was 82%.  Sleep latency was 27.5  minutes which is prolonged.  REM latency is 101.5 minutes which is normal.  The patient was not observed in slow wave sleep but was observed in all  other stages of sleep.  He slept in both supine and unsupine position.   RESPIRATORY DATA:  The average respiratory rate was 12.  The patient  underwent a split night protocol.  During the diagnostic portion of the  study, the apnea/hypopnea index was 15.  The events were exclusively  obstructive in nature and moderate snoring was noted by the technician.  During the therapeutic portion of the study, the patient was titrated from a  CPAP pressure setting of 5 to 16 cm of water.  At a CPAP pressure setting of  9 cm of water, the apnea/hypopnea index was reduced to 1.7.  At this  pressure setting, the patient was observed in both supine sleep and REM  sleep.  At this pressure setting, oxygenation has stabilized and his sleep  architecture had stabilized.   OXYGEN DATA:  The baseline oxygenation was 98%.  The oxygen  saturation nadir  was 79%.  At CPAP pressure setting of 9 cm of water, the mean oxygenation  during non-REM sleep was 96.6% and during REM sleep was 97.5%.   CARDIAC DATA:  The rhythm strip showed normal sinus rhythm with an average  heart rate of 63 and episodes of sinus bradycardia.   MOVEMENT-PARASOMNIA:  The periodic limb movement index was 17.1.  The  patient had one bathroom trip.   IMPRESSION:  This is a split night study protocol.  During the diagnostic  portion of the study, it was found that the patient had moderate obstructive  sleep apnea as demonstrated by an apnea/hypopnea index of 15 and an oxygen  saturation nadir of 79%.  During the therapeutic portion of the study at a  CPAP pressure setting of 9 cm of water, the apnea/hypopnea index was reduced  to 1.7.  At this pressure setting, the patient was observed in both REM  sleep and supine sleep.  At this pressure setting, oxygenation stabilized  and sleep  architecture improved as well.  The patient was fitted with a  medium ResMed ultra Mirage full-face mask and used heated humidification  during this study as well.      Coralyn Helling, MD  Diplomat, American Board of Sleep Medicine  Electronically Signed     VS/MEDQ  D:  08/09/2006 10:14:00  T:  08/09/2006 22:23:30  Job:  [id number]   cc:   Karie Schwalbe, MD  [address] [place], [place] [address]

## 2011-04-19 NOTE — Assessment & Plan Note (Signed)
[facility]                            CARDIOLOGY OFFICE NOTE   Micheal Wright, CINQUEMANI                          MRN:          [id number]  DATE:11/10/2006                            DOB:          [birth year]    PATIENT IDENTIFICATION:  Mr. Micheal Wright is a 70 year old male who returns for  routine follow up.   PROBLEMS:  1. Coronary artery disease.      a.     Status post bypass grafting in 1988.      b.     Last cardiac catheterization January 2002 with over 4 mm of       ST elevation during exercise.  Showed patent grafts.  There as a       30-40% stenosis in the radial artery graft.  2. Hyperlipidemia.  LVL goal with low HDL.  3. High stress.  4. Gastroesophageal reflux disease.  5. Hypertension, controlled.  6. Moderate sleep apnea by recent sleep study.   CURRENT MEDICATIONS:  1. Aciphex 20 mg daily.  2. Aspirin 81 mg.  3. Toprol XL 25 mg.  4. Niaspan 1000 mg.  5. Diovan 80 mg daily.  6. Lipitor 80 mg daily.   INTERVAL HISTORY:  Mr. Micheal Wright returns to clinic for routine follow up.  He  denies any chest pain or shortness of breath.  His functional capacity  is well preserved.  He is very worried about his diagnosis of sleep  apnea but is unable to tolerate the mass.  We had a long discussion  about the pluses and minuses of treating sleep apnea.  He has otherwise  been very compliant with his medications.   PHYSICAL EXAMINATION:  GENERAL:  He is well-appearing, no acute  distress, ambulates around the clinic without any respiratory  difficulty.  VITAL SIGNS:  Blood pressure is 124/70, heart rate 63, weight 211.  HEENT:  Sclerae anicteric.  EOMI.  There is no xanthelasma.  Mucous  membranes moist.  NECK:  Supple.  There is no JVD.  Carotids are 2+ bilaterally without  bruits.  There is no lymphadenopathy or thyromegaly.  CARDIAC:  Regular rate and rhythm.  A soft, systolic ejection murmur at  the right sternal border as well as an S4.  No rub.  LUNGS:  Clear.  ABDOMEN:  Obese, nontender, nondistended.  There is no  hepatosplenomegaly, no bruits, no masses, good bowel sounds.  EXTREMITIES:  Warm with no cyanosis, clubbing or edema.  NEUROLOGICAL:  Alert and oriented x3.  Cranial nerves II-XII intact.  Moves all four extremities without difficulty.  Affect is appropriate.   STUDIES:  EKG shows sinus rhythm at a rate of 63.  No ST-T wave changes.   ASSESSMENT/PLAN:  1. Coronary artery disease.  This is quite stable without any evidence      of ischemia.  He is on a good medical regimen.  2. Hypertension, well controlled.  3. Hyperlipidemia.  His HDL remains low.  We will titrate his Niaspan      to 1500 mg a day with a goal of 2000.  4.  Obstructive sleep apnea.  I discussed the treatment options, none      of which he is terribly excited about.  I told him that if he goes      ahead and looses about 20 pounds he may see a significant      improvement in his sleep quality.  We have agreed to try this      approach, and if he does loose the weight, we can consider      repeating his sleep study in six months.   DISPOSITION:  Will recheck his lipids in three months.  Will see him  back in the clinic in six months.     Bevelyn Buckles. Bensimhon, MD  Electronically Signed    DRB/MedQ  DD: 11/10/2006  DT: 11/11/2006  Job #: [id number]

## 2011-04-19 NOTE — Assessment & Plan Note (Signed)
[facility]                               PULMONARY OFFICE NOTE   KATIE, MOCH                          MRN:          [id number]  DATE:06/24/2006                            DOB:          1941/08/22    REFERRING PHYSICIAN:  Karie Schwalbe, MD.   I had the pleasure of meeting Micheal Wright today for evaluation of his sleep  difficulties.  He says he has this difficulty for several years.  His wife  has told him that he snores and he says that if he lays flat on his back he  will often times wake up feeling like he is choking.  He also tends to  breathe more through his mouth at night.  His current sleep pattern is that  he will go to bed between 11 p.m. and midnight at night, although he will  often times fall asleep prior to this.  He has no difficulty falling asleep.  He says he wakes up 2-3 times during the course of the night, sometimes to  use the bathroom, but then has no difficulty falling back to sleep, and he  wakes up between 8 a.m. and 9 a.m. in the morning, although sometimes he  will sleep in later than that.  He says he is a fairly restless sleeper.  There is no history of sleep walking, sleep talking, nightmares, night  terrors, bruxism, restless leg syndrome, sleep hallucination, sleep  paralysis or cataplexy.  He is not currently using anything to help him fall  asleep at night.  He says he drinks 2L of iced tea throughout the day.  He  denies having any difficulty as far as falling sleep while driving.  He does  take frequent naps during the day.   PAST MEDICAL HISTORY:  Significant for:  1.  Coronary artery disease status post coronary artery bypass grafting and      he has had an appendectomy.  2.  Reflux.  3.  Elevated cholesterol.   MEDICATIONS CURRENTLY:  1.  Aciphex 20 mg daily.  2.  Aspirin 81 mg daily.  3.  FiberCon one b.i.d.  4.  Toprol-XL 25 mg daily.  5.  Niaspan 500 mg q.h.s.  6.  Lipitor 80 mg daily.  7.   Diovan 80 mg daily.  8.  Sublingual nitroglycerin 0.4 mg p.r.n.   HE HAS NO KNOWN DRUG ALLERGIES.   FAMILY HISTORY:  Significant for heart disease.  He is married.  There is no  significant history of alcohol or tobacco abuse.   REVIEW OF SYSTEMS:  Is essentially negative except for what is stated above.   PHYSICAL EXAM:  He is 5 feet 10 inches tall.  Neck size is 17-1/2 inches, he  is 210 pounds.  Temperature is 97.9, blood pressure is 110/60, heart rate is  54, oxygen saturation 97% on room air.  HEENT:  Pupils reactive.  He has a septal deviation to the left.  There is  no sinus tenderness.  He has a Mallampati 3 airway with an elongated and  boggy uvula.  There is no lymphadenopathies, no thyromegaly.  HEART:  S1, S2 regular rhythm.  CHEST:  Clear to auscultation.  ABDOMEN:  Soft.  EXTREMITIES:  No edema.  NEUROLOGIC EXAM:  He is alert and oriented x3 and 5/5 strength.   IMPRESSION:  He certainly has symptoms as well as physical findings which  will be suggestive of sleep disordered breathing.  Given the fact that he  does have underlying coronary artery disease, I felt that it would be  warranted for him to have further testing of this with an overnight  polysomnogram.  Therefore, we will make arrangements for him to be scheduled  for a split night study and then if in fact he does have sleep apnea we  would initiate him on appropriate therapy after review of his sleep study.  In the meantime, I discussed with him the importance of diet, exercise,  weight reduction as well as avoidance of alcohol and sedatives.  Intraoperative precautions were discussed with him as well.                                   Coralyn Helling, MD   VS/MedQ  DD:  06/24/2006  DT:  06/24/2006  Job #:  [id number]   cc:   Karie Schwalbe, MD

## 2011-04-19 NOTE — Assessment & Plan Note (Signed)
[facility]                           GASTROENTEROLOGY OFFICE NOTE   KITT, LEDET                          MRN:          [id number]  DATE:07/01/2006                            DOB:          [birth year]    REFERRING PHYSICIAN:  Karie Schwalbe, MD   NEW GASTROINTESTINAL CONSULTATION:   REASON FOR REFERRAL:  Dr. Alphonsus Sias asked me to evaluate Mr. Mayol in  consultation regarding history of colon polyps and GERD.   HISTORY OF PRESENT ILLNESS:  Mr. Hymas is a very pleasant 70 year old man  without any specific bowel symptoms, who, 3 years ago, had a colonoscopy and  upper endoscopy in [place], [place] by Dr. Alena Bills.  He was told he had 1  small polyp and 1 large polyp in his colon, and that he needed a 1-year  followup.  He had that 1-year followup approximately 2 years ago, and  definitely he was told that he had no repeat polyps, but was told that he  would probably need a colonoscopy every 2-3 years indefinitely.  He did not  remember being told anything about Barrett's esophagus or any strictures.  He was put on AcipHex after his endoscopy 3 years ago, and since this his  GERD symptoms have been completely relieved.  He has no dysphagia.   REVIEW OF SYSTEMS:  Notable for no GI bleeding and stable weight and is  otherwise essentially normal and is available on his nursing intake sheet.   PAST MEDICAL HISTORY:  1.  Acute MI in 1998.  2.  Status post CABG in 1998.  3.  Status post stenting in 1980.  4.  Status post appendectomy in 1958.  5.  Colon polyps as above.   CURRENT MEDICINES:  1.  AcipHex 20 mg once in the morning.  2.  Aspirin.  3.  FiberCon.  4.  Toprol.  5.  Niaspan.  6.  Lipitor.  7.  Diovan.   ALLERGIES:  No known drug allergies.   SOCIAL HISTORY:  Married with 3 sons.  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  Nonsmoker,  nondrinker.   FAMILY HISTORY:  Dad and brothers with heart disease.  Dad with alcoholism.  Sister with breast  cancer.  No colon cancer in family.   PHYSICAL EXAMINATION:  VITAL SIGNS:  Height 5 feet, 10 inches, weight 213  pounds.  Blood pressure 126/70, pulse 64.  CONSTITUTIONAL:  Generally well appearing.  NEUROLOGIC:  Alert and oriented x3.  HEENT:  Eyes - extraocular movements intact.  Mouth - oropharynx moist.  No  lesions.  NECK:  Supple.  No lymphadenopathy.  CARDIOVASCULAR:  Heart regular rate and rhythm.  LUNGS:  Clear to auscultation bilaterally.  ABDOMEN:  Soft, nontender, nondistended.  Normal bowel sounds.  EXTREMITIES:  No lower extremity edema.  SKIN:  No rash or lesions on visible extremities.   ASSESSMENT AND PLAN:  A 70 year old man with well-controlled  gastroesophageal reflux disease symptoms, history of colon polyps.   Current colorectal cancer screening guidelines for people who have a history  of colon polyps are that he would require  colonoscopy at least every 5  years.  It sounds like he did have a colonoscopy 2 years ago and this was  normal, so his next colonoscopy will be scheduled for 5 years from that  date, which would put it not until about 2010.  He may be unclear on his  dates, and also I wonder if there were some unusual findings in his polyps,  so we will contact his gastroenterologist in [place], Dr. Alena Bills and get  records sent from there for our review.  I will follow colorectal cancer  screening guidelines for my recommendation.  His GERD symptoms are very well  controlled.  He has no dysphagia.  He does not know if he every had any  Barrett's changes, so I will review his EGD report when they return, as  well.  If he did not have Barrett's, I see no reason to repeat EGD at this  point.  We will, therefore, await reports from his [place]  gastroenterologist and plan from there.                                   Rachael Fee, MD   DPJ/MedQ  DD:  07/01/2006  DT:  07/01/2006  Job #:  [id number]   cc:   Karie Schwalbe, MD

## 2011-05-21 ENCOUNTER — Encounter: Payer: Self-pay | Admitting: Cardiovascular Disease

## 2011-05-23 ENCOUNTER — Other Ambulatory Visit: Payer: Self-pay | Admitting: Emergency Medicine

## 2011-05-23 MED ORDER — SILDENAFIL CITRATE 100 MG PO TABS: 50.0000 mg | ORAL_TABLET | ORAL | Status: DC | PRN

## 2011-08-26 ENCOUNTER — Other Ambulatory Visit: Payer: Self-pay | Admitting: Cardiology

## 2011-08-26 DIAGNOSIS — I6529 Occlusion and stenosis of unspecified carotid artery: Secondary | ICD-10-CM

## 2011-08-27 ENCOUNTER — Encounter (INDEPENDENT_AMBULATORY_CARE_PROVIDER_SITE_OTHER): Payer: Medicare Other | Admitting: *Deleted

## 2011-08-27 DIAGNOSIS — I6529 Occlusion and stenosis of unspecified carotid artery: Secondary | ICD-10-CM

## 2011-08-30 ENCOUNTER — Other Ambulatory Visit: Payer: Self-pay | Admitting: *Deleted

## 2011-08-30 MED ORDER — NIACIN ER (ANTIHYPERLIPIDEMIC) 1000 MG PO TBCR: 2000.0000 mg | EXTENDED_RELEASE_TABLET | Freq: Every day | ORAL | Status: DC

## 2011-09-03 ENCOUNTER — Ambulatory Visit (INDEPENDENT_AMBULATORY_CARE_PROVIDER_SITE_OTHER): Payer: Medicare Other | Admitting: Internal Medicine

## 2011-09-03 ENCOUNTER — Encounter: Payer: Self-pay | Admitting: Internal Medicine

## 2011-09-03 VITALS — BP 122/64 | HR 55 | Ht 70.0 in | Wt 216.4 lb

## 2011-09-03 DIAGNOSIS — I6529 Occlusion and stenosis of unspecified carotid artery: Secondary | ICD-10-CM

## 2011-09-03 DIAGNOSIS — R011 Cardiac murmur, unspecified: Secondary | ICD-10-CM | POA: Insufficient documentation

## 2011-09-03 DIAGNOSIS — E785 Hyperlipidemia, unspecified: Secondary | ICD-10-CM

## 2011-09-03 DIAGNOSIS — I2510 Atherosclerotic heart disease of native coronary artery without angina pectoris: Secondary | ICD-10-CM

## 2011-09-03 HISTORY — DX: Cardiac murmur, unspecified: R01.1

## 2011-09-03 NOTE — Progress Notes (Signed)
HPI:  Micheal Wright is a very pleasant 70 year old male with a history of coronary artery disease status post coronary artery bypass grafting in 1998.  Last catheterization was in January 2002 due to an abnormal stress test with 4 mm of ST elevation during exercise.  Cardiac catheterization showed patent grafts.  He has a history of hypertension, hyperlipidemia, and sleep apnea. Myoview 2/09 EF 61% No sichemia.   Returns for routine f/u. Remains very active cutting lawns and other chores. No CP or SOB. No neuro symptoms. Can still do backflips off the edge of the pool.    Had lipids checked earlier this year.  TC 97 TG 85 HDL 39 LDL 41  Carotid u/s 9/11  R 40-59% L 0-39% (stable). Had carotid u/s last week stable disease with interval R vertebral occlusion. ABI 1/11  R 1.0  L 1.1   ROS: All systems negative except as listed in HPI, PMH and Problem List.  Past Medical History  Diagnosis Date  . History of colonic polyps   . CAD (coronary artery disease)     s/p CABG 1998; Last cath 2002 in setting of marked ST elevation during stress testing. patent grafts; Echo 8/09 EF 55%. No significant valvular disease.  . Carotid artery disease     u/s 9/10: R 40-59% L 0-39%  . GERD (gastroesophageal reflux disease)   . Hyperlipidemia   . Hypertension   . Erectile dysfunction   . Sleep apnea     Current Outpatient Prescriptions  Medication Sig Dispense Refill  . atorvastatin (LIPITOR) 80 MG tablet Take 1 tablet (80 mg total) by mouth daily.  30 tablet  6  . metoprolol succinate (TOPROL XL) 25 MG 24 hr tablet Take 1 tablet (25 mg total) by mouth daily.  30 tablet  11  . niacin (NIASPAN) 1000 MG CR tablet Take 2 tablets (2,000 mg total) by mouth at bedtime.  60 tablet  3  . nitroGLYCERIN (NITROSTAT) 0.4 MG SL tablet Place 0.4 mg under the tongue every 5 (five) minutes as needed. May repeat for up to 3 doses.       . RABEprazole (ACIPHEX) 20 MG tablet Take 20 mg by mouth daily.        . sildenafil (VIAGRA)  100 MG tablet Take 0.5 tablets (50 mg total) by mouth as needed.  10 tablet  6  . valsartan (DIOVAN) 80 MG tablet Take 80 mg by mouth daily.           PHYSICAL EXAM: Filed Vitals:   09/03/11 0910  BP: 122/64  Pulse: 55   General:  Well appearing. No resp difficulty HEENT: normal Neck: supple. JVP flat. Carotids 2+ bilaterally; no bruits. No lymphadenopathy or thryomegaly appreciated. Cor: PMI normal. Regular rate & rhythm. No rubs, gallops. 2/6 murmur at RSB. Crisp s2. Lungs: clear Abdomen: soft, nontender, nondistended. No hepatosplenomegaly. No bruits or masses. Good bowel sounds. Extremities: no cyanosis, clubbing, rash, edema Neuro: alert & orientedx3, cranial nerves grossly intact. Moves all 4 extremities w/o difficulty. Affect pleasant.    ECG: Sinus brady 55 with PVCs No ST-T wave abnormalities.     ASSESSMENT & PLAN:

## 2011-09-03 NOTE — Assessment & Plan Note (Signed)
Stable. Asx. Continue statin and ASA.

## 2011-09-03 NOTE — Assessment & Plan Note (Signed)
No evidence of ischemia. Continue current regimen.   

## 2011-09-03 NOTE — Assessment & Plan Note (Signed)
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 Will check echo.

## 2011-09-03 NOTE — Patient Instructions (Signed)

## 2011-09-03 NOTE — Assessment & Plan Note (Signed)
Lipids look great. Continue current therapy.

## 2011-09-04 ENCOUNTER — Encounter: Payer: Self-pay | Admitting: *Deleted

## 2011-09-12 ENCOUNTER — Ambulatory Visit (HOSPITAL_COMMUNITY): Payer: Medicare Other | Attending: Internal Medicine | Admitting: Radiology

## 2011-09-12 DIAGNOSIS — I10 Essential (primary) hypertension: Secondary | ICD-10-CM | POA: Insufficient documentation

## 2011-09-12 DIAGNOSIS — I2510 Atherosclerotic heart disease of native coronary artery without angina pectoris: Secondary | ICD-10-CM | POA: Insufficient documentation

## 2011-09-12 DIAGNOSIS — E785 Hyperlipidemia, unspecified: Secondary | ICD-10-CM | POA: Insufficient documentation

## 2011-09-12 DIAGNOSIS — R011 Cardiac murmur, unspecified: Secondary | ICD-10-CM

## 2011-09-12 DIAGNOSIS — I651 Occlusion and stenosis of basilar artery: Secondary | ICD-10-CM | POA: Insufficient documentation

## 2011-09-20 ENCOUNTER — Telehealth: Payer: Self-pay | Admitting: Internal Medicine

## 2011-09-20 NOTE — Telephone Encounter (Signed)
Pt calling wanting to know test results. Please return pt call to discuss further.

## 2011-09-20 NOTE — Telephone Encounter (Signed)
Pt was notified of echo results. 

## 2011-10-14 ENCOUNTER — Other Ambulatory Visit: Payer: Self-pay

## 2011-10-14 MED ORDER — ATORVASTATIN CALCIUM 80 MG PO TABS: 80.0000 mg | ORAL_TABLET | Freq: Every day | ORAL | Status: DC

## 2011-12-25 DIAGNOSIS — H2510 Age-related nuclear cataract, unspecified eye: Secondary | ICD-10-CM | POA: Diagnosis not present

## 2011-12-26 ENCOUNTER — Telehealth: Payer: Self-pay | Admitting: Internal Medicine

## 2011-12-26 NOTE — Telephone Encounter (Signed)
Pt came by [facility] office and wanted to make an appt with Bensimhon. He does not have any openings until June in the office. Can you call pt and let him know when and or if he can be seen.

## 2011-12-27 ENCOUNTER — Other Ambulatory Visit: Payer: Self-pay

## 2011-12-27 MED ORDER — NIACIN ER (ANTIHYPERLIPIDEMIC) 1000 MG PO TBCR: 2000.0000 mg | EXTENDED_RELEASE_TABLET | Freq: Every day | ORAL | Status: DC

## 2011-12-31 DIAGNOSIS — H903 Sensorineural hearing loss, bilateral: Secondary | ICD-10-CM | POA: Diagnosis not present

## 2011-12-31 DIAGNOSIS — H6120 Impacted cerumen, unspecified ear: Secondary | ICD-10-CM | POA: Diagnosis not present

## 2011-12-31 NOTE — Telephone Encounter (Signed)
Marlowe Kays he will due in April if Dan doesn't have anything for then he can see someone else, thanks

## 2012-01-03 NOTE — Telephone Encounter (Signed)
New problem:  Call patient from request of nurse Meredith Staggers, to set up an appointment with another provider. patient stated he will contact Dr. Gala Romney personally not set up an appt.

## 2012-01-09 NOTE — Telephone Encounter (Signed)
Per Dr Gala Romney pt needs appt in CHF clinic in March

## 2012-01-29 DIAGNOSIS — Z86010 Personal history of colonic polyps: Secondary | ICD-10-CM | POA: Diagnosis not present

## 2012-01-29 DIAGNOSIS — K219 Gastro-esophageal reflux disease without esophagitis: Secondary | ICD-10-CM | POA: Diagnosis not present

## 2012-01-31 ENCOUNTER — Telehealth: Payer: Self-pay | Admitting: Internal Medicine

## 2012-01-31 NOTE — Telephone Encounter (Signed)
LOV,Echo,12 lead faxed to [facility] @ [phone] 01/31/12/KM

## 2012-02-11 ENCOUNTER — Ambulatory Visit (HOSPITAL_COMMUNITY)
Admission: RE | Admit: 2012-02-11 | Discharge: 2012-02-11 | Disposition: A | Discharge status: Home / Self Care | Payer: Medicare Other | Source: Ambulatory Visit | Attending: Internal Medicine | Admitting: Internal Medicine

## 2012-02-11 VITALS — BP 110/70 | HR 71 | Ht 70.0 in | Wt 212.5 lb

## 2012-02-11 DIAGNOSIS — Z860100 Personal history of colon polyps, unspecified: Secondary | ICD-10-CM | POA: Insufficient documentation

## 2012-02-11 DIAGNOSIS — I2510 Atherosclerotic heart disease of native coronary artery without angina pectoris: Secondary | ICD-10-CM | POA: Insufficient documentation

## 2012-02-11 DIAGNOSIS — I10 Essential (primary) hypertension: Secondary | ICD-10-CM | POA: Diagnosis not present

## 2012-02-11 DIAGNOSIS — K219 Gastro-esophageal reflux disease without esophagitis: Secondary | ICD-10-CM | POA: Diagnosis not present

## 2012-02-11 DIAGNOSIS — Z7982 Long term (current) use of aspirin: Secondary | ICD-10-CM | POA: Insufficient documentation

## 2012-02-11 DIAGNOSIS — I6529 Occlusion and stenosis of unspecified carotid artery: Secondary | ICD-10-CM | POA: Diagnosis not present

## 2012-02-11 DIAGNOSIS — Z951 Presence of aortocoronary bypass graft: Secondary | ICD-10-CM | POA: Insufficient documentation

## 2012-02-11 DIAGNOSIS — E785 Hyperlipidemia, unspecified: Secondary | ICD-10-CM | POA: Insufficient documentation

## 2012-02-11 DIAGNOSIS — G4730 Sleep apnea, unspecified: Secondary | ICD-10-CM | POA: Diagnosis not present

## 2012-02-11 DIAGNOSIS — I779 Disorder of arteries and arterioles, unspecified: Secondary | ICD-10-CM | POA: Diagnosis not present

## 2012-02-11 DIAGNOSIS — N529 Male erectile dysfunction, unspecified: Secondary | ICD-10-CM | POA: Diagnosis not present

## 2012-02-11 NOTE — Assessment & Plan Note (Signed)
Very stable. Mild to moderate disease. Will switch scans to every other year.

## 2012-02-11 NOTE — Progress Notes (Signed)
HPI:  Micheal Wright is a very pleasant 71 year old male with a history of coronary artery disease status post coronary artery bypass grafting in 1998.  Last catheterization was in January 2002 due to an abnormal stress test with 4 mm of ST elevation during exercise.  Cardiac catheterization showed patent grafts.  He has a history of hypertension, hyperlipidemia, and sleep apnea. Myoview 2/09 EF 61% No sichemia.   Returns for routine f/u. Remains very active cutting lawns and other chores. No CP or SOB. BP well controlled. No focal neuro symptoms.    Had lipids checked last year:  TC 97 TG 85 HDL 39 LDL 41  Carotid u/s 9/12  R 40-59% L 0-39% (stable). interval R vertebral occlusion. ABI 1/11  R 1.0  L 1.1    ROS: All systems negative except as listed in HPI, PMH and Problem List.  Past Medical History  Diagnosis Date  . History of colonic polyps   . CAD (coronary artery disease)     s/p CABG 1998; Last cath 2002 in setting of marked ST elevation during stress testing. patent grafts; Echo 8/09 EF 55%. No significant valvular disease.  . Carotid artery disease     u/s 9/10: R 40-59% L 0-39%  . GERD (gastroesophageal reflux disease)   . Hyperlipidemia   . Hypertension   . Erectile dysfunction   . Sleep apnea     Current Outpatient Prescriptions  Medication Sig Dispense Refill  . aspirin 81 MG tablet Take 81 mg by mouth daily.      Marland Kitchen atorvastatin (LIPITOR) 80 MG tablet Take 1 tablet (80 mg total) by mouth daily.  30 tablet  4  . metoprolol succinate (TOPROL XL) 25 MG 24 hr tablet Take 1 tablet (25 mg total) by mouth daily.  30 tablet  11  . niacin (NIASPAN) 1000 MG CR tablet Take 2 tablets (2,000 mg total) by mouth at bedtime.  60 tablet  3  . nitroGLYCERIN (NITROSTAT) 0.4 MG SL tablet Place 0.4 mg under the tongue every 5 (five) minutes as needed. May repeat for up to 3 doses.       . RABEprazole (ACIPHEX) 20 MG tablet Take 20 mg by mouth daily.        . sildenafil (VIAGRA) 100 MG tablet Take  0.5 tablets (50 mg total) by mouth as needed.  10 tablet  6  . valsartan (DIOVAN) 80 MG tablet Take 80 mg by mouth daily.           PHYSICAL EXAM: Filed Vitals:   02/11/12 1233  BP: 110/70  Pulse: 71   General:  Well appearing. No resp difficulty HEENT: normal Neck: supple. JVP flat. Carotids 2+ bilaterally; no bruits. No lymphadenopathy or thryomegaly appreciated. Cor: PMI normal. Regular rate & rhythm. No rubs, gallops. 2/6 murmur at RSB. Crisp s2. Lungs: clear Abdomen: soft, nontender, nondistended. No hepatosplenomegaly. No bruits or masses. Good bowel sounds. Extremities: no cyanosis, clubbing, rash, edema Neuro: alert & orientedx3, cranial nerves grossly intact. Moves all 4 extremities w/o difficulty. Affect pleasant.   ASSESSMENT & PLAN:

## 2012-02-11 NOTE — Assessment & Plan Note (Signed)
Blood pressure well controlled. Continue current regimen.  

## 2012-02-11 NOTE — Assessment & Plan Note (Signed)
Lipids look good. Due for recheck. We discussed recent Niacin trials and lack of benefit from the therapy. We will stop. Check lipids now and in 6 months.

## 2012-02-11 NOTE — Assessment & Plan Note (Signed)
No evidence of ischemia. Continue current regimen.   

## 2012-02-12 ENCOUNTER — Ambulatory Visit (INDEPENDENT_AMBULATORY_CARE_PROVIDER_SITE_OTHER): Payer: Medicare Other

## 2012-02-12 DIAGNOSIS — I10 Essential (primary) hypertension: Secondary | ICD-10-CM

## 2012-02-12 DIAGNOSIS — E7800 Pure hypercholesterolemia, unspecified: Secondary | ICD-10-CM

## 2012-02-12 DIAGNOSIS — Z79899 Other long term (current) drug therapy: Secondary | ICD-10-CM

## 2012-02-12 DIAGNOSIS — I2510 Atherosclerotic heart disease of native coronary artery without angina pectoris: Secondary | ICD-10-CM | POA: Diagnosis not present

## 2012-02-13 ENCOUNTER — Encounter (HOSPITAL_COMMUNITY): Payer: Medicare Other

## 2012-02-13 LAB — CBC WITH DIFFERENTIAL/PLATELET
Basos: 0 % (ref 0–3)
Eos: 2 % (ref 0–7)
Eosinophils Absolute: 0.1 10*3/uL (ref 0.0–0.4)
Immature Grans (Abs): 0 10*3/uL (ref 0.0–0.1)
Immature Granulocytes: 0 % (ref 0–2)
Lymphs: 42 % (ref 14–46)
MCH: 32.5 pg (ref 26.6–33.0)
MCV: 92 fL (ref 79–97)
Monocytes Absolute: 0.6 10*3/uL (ref 0.1–1.0)
Neutrophils Relative %: 45 % (ref 40–74)
RBC: 4.21 x10E6/uL (ref 4.14–5.80)
RDW: 13.1 % (ref 12.3–15.4)
WBC: 5.7 10*3/uL (ref 4.0–10.5)

## 2012-02-13 LAB — LIPID PANEL
HDL: 39 mg/dL — ABNORMAL LOW (ref >39)
LDL Calculated: 37 mg/dL (ref 0–99)
VLDL Cholesterol Cal: 17 mg/dL (ref 5–40)

## 2012-02-13 LAB — COMPREHENSIVE METABOLIC PANEL
ALT: 19 IU/L (ref 0–44)
Albumin/Globulin Ratio: 1.4 (ref 1.1–2.5)
Albumin: 4 g/dL (ref 3.5–4.8)
BUN: 10 mg/dL (ref 8–27)
Calcium: 8.9 mg/dL (ref 8.6–10.2)
Creatinine, Ser: 0.86 mg/dL (ref 0.76–1.27)
GFR calc Af Amer: 102 mL/min/{1.73_m2} (ref >59)
GFR calc non Af Amer: 88 mL/min/{1.73_m2} (ref >59)
Glucose: 96 mg/dL (ref 65–99)
Total Bilirubin: 1.4 mg/dL — ABNORMAL HIGH (ref 0.0–1.2)
Total Protein: 6.9 g/dL (ref 6.0–8.5)

## 2012-02-13 LAB — HEMOGLOBIN A1C: Hgb A1c MFr Bld: 4.2 % — ABNORMAL LOW (ref 4.8–5.6)

## 2012-02-17 DIAGNOSIS — Z1331 Encounter for screening for depression: Secondary | ICD-10-CM | POA: Diagnosis not present

## 2012-02-17 DIAGNOSIS — M5380 Other specified dorsopathies, site unspecified: Secondary | ICD-10-CM | POA: Diagnosis not present

## 2012-02-17 DIAGNOSIS — IMO0001 Reserved for inherently not codable concepts without codable children: Secondary | ICD-10-CM | POA: Diagnosis not present

## 2012-02-17 DIAGNOSIS — Z1339 Encounter for screening examination for other mental health and behavioral disorders: Secondary | ICD-10-CM | POA: Diagnosis not present

## 2012-02-17 DIAGNOSIS — Z0000 Encounter for general adult medical examination without abnormal findings: Secondary | ICD-10-CM | POA: Diagnosis not present

## 2012-02-17 DIAGNOSIS — Z23 Encounter for immunization: Secondary | ICD-10-CM | POA: Diagnosis not present

## 2012-02-18 ENCOUNTER — Telehealth: Payer: Self-pay | Admitting: Cardiovascular Disease

## 2012-02-18 NOTE — Telephone Encounter (Signed)
Pt calling for lab results.

## 2012-02-18 NOTE — Telephone Encounter (Signed)
Pt notified of lab results

## 2012-03-02 ENCOUNTER — Other Ambulatory Visit: Payer: Self-pay

## 2012-03-02 MED ORDER — METOPROLOL SUCCINATE ER 25 MG PO TB24: 25.0000 mg | ORAL_TABLET | Freq: Every day | ORAL | Status: DC

## 2012-03-02 NOTE — Telephone Encounter (Signed)
..   Requested Prescriptions   Signed Prescriptions Disp Refills  . metoprolol succinate (TOPROL XL) 25 MG 24 hr tablet 30 tablet 11    Sig: Take 1 tablet (25 mg total) by mouth daily.    Authorizing Provider: Dolores Patty    Ordering User: Lacie Scotts

## 2012-03-04 ENCOUNTER — Encounter (HOSPITAL_COMMUNITY): Payer: Self-pay | Admitting: *Deleted

## 2012-03-09 ENCOUNTER — Ambulatory Visit: Payer: Self-pay | Admitting: Unknown Physician Specialty

## 2012-03-09 DIAGNOSIS — I252 Old myocardial infarction: Secondary | ICD-10-CM | POA: Diagnosis not present

## 2012-03-09 DIAGNOSIS — K319 Disease of stomach and duodenum, unspecified: Secondary | ICD-10-CM | POA: Diagnosis not present

## 2012-03-09 DIAGNOSIS — K279 Peptic ulcer, site unspecified, unspecified as acute or chronic, without hemorrhage or perforation: Secondary | ICD-10-CM | POA: Diagnosis not present

## 2012-03-09 DIAGNOSIS — Z951 Presence of aortocoronary bypass graft: Secondary | ICD-10-CM | POA: Diagnosis not present

## 2012-03-09 DIAGNOSIS — K2990 Gastroduodenitis, unspecified, without bleeding: Secondary | ICD-10-CM | POA: Diagnosis not present

## 2012-03-09 DIAGNOSIS — R1310 Dysphagia, unspecified: Secondary | ICD-10-CM | POA: Diagnosis not present

## 2012-03-09 DIAGNOSIS — Z7982 Long term (current) use of aspirin: Secondary | ICD-10-CM | POA: Diagnosis not present

## 2012-03-09 DIAGNOSIS — Z803 Family history of malignant neoplasm of breast: Secondary | ICD-10-CM | POA: Diagnosis not present

## 2012-03-09 DIAGNOSIS — K5900 Constipation, unspecified: Secondary | ICD-10-CM | POA: Diagnosis not present

## 2012-03-09 DIAGNOSIS — Z09 Encounter for follow-up examination after completed treatment for conditions other than malignant neoplasm: Secondary | ICD-10-CM | POA: Diagnosis not present

## 2012-03-09 DIAGNOSIS — K219 Gastro-esophageal reflux disease without esophagitis: Secondary | ICD-10-CM | POA: Diagnosis not present

## 2012-03-09 DIAGNOSIS — K648 Other hemorrhoids: Secondary | ICD-10-CM | POA: Diagnosis not present

## 2012-03-09 DIAGNOSIS — G4730 Sleep apnea, unspecified: Secondary | ICD-10-CM | POA: Diagnosis not present

## 2012-03-09 DIAGNOSIS — D126 Benign neoplasm of colon, unspecified: Secondary | ICD-10-CM | POA: Diagnosis not present

## 2012-03-09 DIAGNOSIS — Z86010 Personal history of colonic polyps: Secondary | ICD-10-CM | POA: Diagnosis not present

## 2012-03-09 DIAGNOSIS — Z79899 Other long term (current) drug therapy: Secondary | ICD-10-CM | POA: Diagnosis not present

## 2012-03-09 DIAGNOSIS — Z87891 Personal history of nicotine dependence: Secondary | ICD-10-CM | POA: Diagnosis not present

## 2012-03-09 DIAGNOSIS — K2970 Gastritis, unspecified, without bleeding: Secondary | ICD-10-CM | POA: Diagnosis not present

## 2012-03-10 ENCOUNTER — Other Ambulatory Visit: Payer: Self-pay | Admitting: *Deleted

## 2012-03-10 MED ORDER — ATORVASTATIN CALCIUM 80 MG PO TABS: 80.0000 mg | ORAL_TABLET | Freq: Every day | ORAL | Status: DC

## 2012-03-11 LAB — PATHOLOGY REPORT

## 2012-04-24 ENCOUNTER — Encounter: Payer: Self-pay | Admitting: Internal Medicine

## 2012-04-24 ENCOUNTER — Ambulatory Visit (INDEPENDENT_AMBULATORY_CARE_PROVIDER_SITE_OTHER): Payer: Medicare Other

## 2012-04-24 VITALS — BP 112/60 | HR 57 | Ht 70.0 in | Wt 215.0 lb

## 2012-04-24 DIAGNOSIS — R011 Cardiac murmur, unspecified: Secondary | ICD-10-CM | POA: Diagnosis not present

## 2012-04-28 ENCOUNTER — Telehealth: Payer: Self-pay

## 2012-04-28 NOTE — Progress Notes (Signed)
Pt came in per Dr. Kathi Ludwig request for EKG.  [place] palpitations [place] fire station and was told to come in.    EKG performed. Showed NSR.  We let him go and told him we would make Dr. Clarise Cruz aware.  Pt was asymptomatic.

## 2012-04-28 NOTE — Telephone Encounter (Signed)
Message copied by Marcelle Overlie on Tue Apr 28, 2012  9:37 AM ------      Message from: Dolores Patty      Created: Fri Apr 24, 2012  7:45 PM      Regarding: RE: EKG       Thanks!            ----- Message -----         From: Marcelle Overlie, RN         Sent: 04/24/2012   9:57 AM           To: Dolores Patty, MD      Subject: EKG                                                      Pt came in for EKG.      Today he was in NSR      Only compliant was fatigue      VS stable      Thanks!      EKG scanned in

## 2012-06-03 ENCOUNTER — Other Ambulatory Visit: Payer: Self-pay | Admitting: *Deleted

## 2012-06-03 MED ORDER — SILDENAFIL CITRATE 100 MG PO TABS: 50.0000 mg | ORAL_TABLET | ORAL | Status: DC | PRN

## 2012-06-18 DIAGNOSIS — D485 Neoplasm of uncertain behavior of skin: Secondary | ICD-10-CM | POA: Diagnosis not present

## 2012-06-18 DIAGNOSIS — D1801 Hemangioma of skin and subcutaneous tissue: Secondary | ICD-10-CM | POA: Diagnosis not present

## 2012-06-18 DIAGNOSIS — C44320 Squamous cell carcinoma of skin of unspecified parts of face: Secondary | ICD-10-CM | POA: Diagnosis not present

## 2012-06-18 DIAGNOSIS — D234 Other benign neoplasm of skin of scalp and neck: Secondary | ICD-10-CM | POA: Diagnosis not present

## 2012-06-18 DIAGNOSIS — L570 Actinic keratosis: Secondary | ICD-10-CM | POA: Diagnosis not present

## 2012-07-22 ENCOUNTER — Other Ambulatory Visit: Payer: Self-pay | Admitting: *Deleted

## 2012-07-22 MED ORDER — NITROGLYCERIN 0.4 MG SL SUBL: 0.4000 mg | SUBLINGUAL_TABLET | SUBLINGUAL | Status: DC | PRN

## 2012-08-07 ENCOUNTER — Other Ambulatory Visit: Payer: Self-pay

## 2012-08-07 MED ORDER — METOPROLOL SUCCINATE ER 25 MG PO TB24: 25.0000 mg | ORAL_TABLET | Freq: Every day | ORAL | Status: DC

## 2012-08-12 ENCOUNTER — Other Ambulatory Visit (HOSPITAL_COMMUNITY): Payer: Self-pay | Admitting: Cardiology

## 2012-08-12 DIAGNOSIS — C44320 Squamous cell carcinoma of skin of unspecified parts of face: Secondary | ICD-10-CM | POA: Diagnosis not present

## 2012-08-12 DIAGNOSIS — E785 Hyperlipidemia, unspecified: Secondary | ICD-10-CM

## 2012-08-12 DIAGNOSIS — C4441 Basal cell carcinoma of skin of scalp and neck: Secondary | ICD-10-CM | POA: Diagnosis not present

## 2012-08-12 MED ORDER — ATORVASTATIN CALCIUM 80 MG PO TABS: 80.0000 mg | ORAL_TABLET | Freq: Every day | ORAL | Status: DC

## 2012-08-12 NOTE — Telephone Encounter (Signed)
Pt called to request a refill o fmeds. done

## 2012-08-17 DIAGNOSIS — M549 Dorsalgia, unspecified: Secondary | ICD-10-CM | POA: Diagnosis not present

## 2012-08-17 DIAGNOSIS — Z23 Encounter for immunization: Secondary | ICD-10-CM | POA: Diagnosis not present

## 2012-08-17 DIAGNOSIS — Z79899 Other long term (current) drug therapy: Secondary | ICD-10-CM | POA: Diagnosis not present

## 2012-08-17 DIAGNOSIS — E7800 Pure hypercholesterolemia, unspecified: Secondary | ICD-10-CM | POA: Diagnosis not present

## 2012-08-17 DIAGNOSIS — I10 Essential (primary) hypertension: Secondary | ICD-10-CM | POA: Diagnosis not present

## 2012-08-18 DIAGNOSIS — I10 Essential (primary) hypertension: Secondary | ICD-10-CM | POA: Diagnosis not present

## 2012-08-18 DIAGNOSIS — E7800 Pure hypercholesterolemia, unspecified: Secondary | ICD-10-CM | POA: Diagnosis not present

## 2012-08-25 ENCOUNTER — Other Ambulatory Visit (HOSPITAL_COMMUNITY): Payer: Self-pay | Admitting: *Deleted

## 2012-08-25 MED ORDER — LOSARTAN POTASSIUM 100 MG PO TABS: 100.0000 mg | ORAL_TABLET | Freq: Every day | ORAL | Status: DC

## 2012-08-25 NOTE — Telephone Encounter (Signed)
Pt has been on losartan for a while not diovan, med changed in system and refill sent

## 2012-08-26 DIAGNOSIS — C44320 Squamous cell carcinoma of skin of unspecified parts of face: Secondary | ICD-10-CM | POA: Diagnosis not present

## 2012-08-26 DIAGNOSIS — D0439 Carcinoma in situ of skin of other parts of face: Secondary | ICD-10-CM | POA: Diagnosis not present

## 2012-08-31 DIAGNOSIS — R361 Hematospermia: Secondary | ICD-10-CM | POA: Insufficient documentation

## 2012-08-31 DIAGNOSIS — N529 Male erectile dysfunction, unspecified: Secondary | ICD-10-CM | POA: Diagnosis not present

## 2012-09-14 ENCOUNTER — Ambulatory Visit (HOSPITAL_COMMUNITY)
Admission: RE | Admit: 2012-09-14 | Discharge: 2012-09-14 | Disposition: A | Discharge status: Home / Self Care | Payer: Medicare Other | Source: Ambulatory Visit | Attending: Internal Medicine | Admitting: Internal Medicine

## 2012-09-14 VITALS — BP 140/80 | HR 67 | Wt 211.8 lb

## 2012-09-14 DIAGNOSIS — I779 Disorder of arteries and arterioles, unspecified: Secondary | ICD-10-CM | POA: Diagnosis not present

## 2012-09-14 DIAGNOSIS — I2510 Atherosclerotic heart disease of native coronary artery without angina pectoris: Secondary | ICD-10-CM

## 2012-09-14 DIAGNOSIS — I6529 Occlusion and stenosis of unspecified carotid artery: Secondary | ICD-10-CM | POA: Diagnosis not present

## 2012-09-14 DIAGNOSIS — E785 Hyperlipidemia, unspecified: Secondary | ICD-10-CM | POA: Diagnosis not present

## 2012-09-14 DIAGNOSIS — I10 Essential (primary) hypertension: Secondary | ICD-10-CM

## 2012-09-14 NOTE — Patient Instructions (Addendum)
Your physician recommends that you schedule a follow-up appointment in: ONE YEAR (OCTOBER 2014)  Your physician has requested that you have a carotid duplex. This test is an ultrasound of the carotid arteries in your neck. It looks at blood flow through these arteries that supply the brain with blood. Allow one hour for this exam. There are no restrictions or special instructions. (09/29/12 @ 10:00aM)

## 2012-09-14 NOTE — Addendum Note (Signed)
Encounter addended by: Theresia Bough, CMA on: 09/14/2012  4:39 PM<BR>     Documentation filed: Patient Instructions Section, Orders

## 2012-09-14 NOTE — Assessment & Plan Note (Signed)
Asymptomatic. Due for repeat u/s.  

## 2012-09-14 NOTE — Assessment & Plan Note (Signed)
BP up slightly here but when he checks [place] [employer] usually 120/65. Will continue current regimen.

## 2012-09-14 NOTE — Progress Notes (Signed)
Patient ID: Micheal Wright, male   DOB: May 28, 1941, 71 y.o.   MRN: [id number] PCP: Dr Sullivan Lone HPI:  Micheal Wright is a very pleasant 71 year old male with a history of coronary artery disease status post coronary artery bypass grafting in 1998.  Last catheterization was in January 2002 due to an abnormal stress test with 4 mm of ST elevation during exercise.  Cardiac catheterization showed patent grafts.  He has a history of hypertension, hyperlipidemia, and sleep apnea. Myoview 2/09 EF 61% No sichemia.   Carotid u/s 9/12  R 40-59% L 0-39% (stable). Interval R vertebral occlusion. ABI 1/11  R 1.0  L 1.1  He returns for follow up. Remains very active - mowing grass and doing yardwork. Denies SOB/PND/Orthopnea/CP. Compliant with medications.   Had lipids checked recently with Dr. Sullivan Lone - looked great. Continues with some muscle soreness he relates to statin. Stopped Niacin several months ago.     ROS: All systems negative except as listed in HPI, PMH and Problem List.  Past Medical History  Diagnosis Date  . History of colonic polyps   . CAD (coronary artery disease)     s/p CABG 1998; Last cath 2002 in setting of marked ST elevation during stress testing. patent grafts; Echo 8/09 EF 55%. No significant valvular disease.  . Carotid artery disease     u/s 9/10: R 40-59% L 0-39%  . GERD (gastroesophageal reflux disease)   . Hyperlipidemia   . Hypertension   . Erectile dysfunction   . Sleep apnea     Current Outpatient Prescriptions  Medication Sig Dispense Refill  . aspirin 81 MG tablet Take 81 mg by mouth daily.      Marland Kitchen atorvastatin (LIPITOR) 80 MG tablet Take 1 tablet (80 mg total) by mouth daily.  30 tablet  4  . losartan (COZAAR) 100 MG tablet Take 1 tablet (100 mg total) by mouth daily.  30 tablet  12  . metoprolol succinate (TOPROL XL) 25 MG 24 hr tablet Take 1 tablet (25 mg total) by mouth daily.  30 tablet  5  . niacin (NIASPAN) 1000 MG CR tablet Take 2 tablets (2,000 mg total) by mouth  at bedtime.  60 tablet  3  . RABEprazole (ACIPHEX) 20 MG tablet Take 20 mg by mouth daily.        . nitroGLYCERIN (NITROSTAT) 0.4 MG SL tablet Place 1 tablet (0.4 mg total) under the tongue every 5 (five) minutes as needed. May repeat for up to 3 doses.  25 tablet  3  . sildenafil (VIAGRA) 100 MG tablet Take 0.5 tablets (50 mg total) by mouth as needed.  10 tablet  6     PHYSICAL EXAM: Filed Vitals:   09/14/12 1522  BP: 140/80  Pulse: 67   General:  Well appearing. No resp difficulty HEENT: normal Neck: supple. JVP flat. Carotids 2+ bilaterally; no bruits. No lymphadenopathy or thryomegaly appreciated. Cor: PMI normal. Regular rate & rhythm. No rubs, gallops. 2/6 murmur at RSB. Crisp s2. Lungs: clear Abdomen: soft, nontender, nondistended. No hepatosplenomegaly. No bruits or masses. Good bowel sounds. Extremities: no cyanosis, clubbing, rash, edema Neuro: alert & orientedx3, cranial nerves grossly intact. Moves all 4 extremities w/o difficulty. Affect pleasant.  ECG: Sbrady 57 No ST-T wave abnormalities.    ASSESSMENT & PLAN:

## 2012-09-14 NOTE — Assessment & Plan Note (Signed)
No evidence of ischemia. Continue current regimen.   

## 2012-09-14 NOTE — Assessment & Plan Note (Signed)
Lipids look good. Continue current regimen.  

## 2012-09-23 ENCOUNTER — Ambulatory Visit: Payer: Self-pay | Admitting: Unknown Physician Specialty

## 2012-09-23 DIAGNOSIS — I10 Essential (primary) hypertension: Secondary | ICD-10-CM | POA: Diagnosis not present

## 2012-09-23 DIAGNOSIS — Z79899 Other long term (current) drug therapy: Secondary | ICD-10-CM | POA: Diagnosis not present

## 2012-09-23 DIAGNOSIS — Z860100 Personal history of colon polyps, unspecified: Secondary | ICD-10-CM | POA: Diagnosis not present

## 2012-09-23 DIAGNOSIS — Z9089 Acquired absence of other organs: Secondary | ICD-10-CM | POA: Diagnosis not present

## 2012-09-23 DIAGNOSIS — G4730 Sleep apnea, unspecified: Secondary | ICD-10-CM | POA: Diagnosis not present

## 2012-09-23 DIAGNOSIS — Z09 Encounter for follow-up examination after completed treatment for conditions other than malignant neoplasm: Secondary | ICD-10-CM | POA: Diagnosis not present

## 2012-09-23 DIAGNOSIS — I252 Old myocardial infarction: Secondary | ICD-10-CM | POA: Diagnosis not present

## 2012-09-23 DIAGNOSIS — Z87891 Personal history of nicotine dependence: Secondary | ICD-10-CM | POA: Diagnosis not present

## 2012-09-23 DIAGNOSIS — Z951 Presence of aortocoronary bypass graft: Secondary | ICD-10-CM | POA: Diagnosis not present

## 2012-09-23 DIAGNOSIS — H9190 Unspecified hearing loss, unspecified ear: Secondary | ICD-10-CM | POA: Diagnosis not present

## 2012-09-23 DIAGNOSIS — Z7982 Long term (current) use of aspirin: Secondary | ICD-10-CM | POA: Diagnosis not present

## 2012-09-23 DIAGNOSIS — I2510 Atherosclerotic heart disease of native coronary artery without angina pectoris: Secondary | ICD-10-CM | POA: Diagnosis not present

## 2012-09-23 DIAGNOSIS — D371 Neoplasm of uncertain behavior of stomach: Secondary | ICD-10-CM | POA: Diagnosis not present

## 2012-09-23 DIAGNOSIS — D126 Benign neoplasm of colon, unspecified: Secondary | ICD-10-CM | POA: Diagnosis not present

## 2012-09-29 ENCOUNTER — Ambulatory Visit (HOSPITAL_COMMUNITY)
Admission: RE | Admit: 2012-09-29 | Discharge: 2012-09-29 | Disposition: A | Discharge status: Home / Self Care | Payer: Medicare Other | Source: Ambulatory Visit | Attending: Internal Medicine | Admitting: Internal Medicine

## 2012-09-29 DIAGNOSIS — I10 Essential (primary) hypertension: Secondary | ICD-10-CM | POA: Insufficient documentation

## 2012-09-29 DIAGNOSIS — R0989 Other specified symptoms and signs involving the circulatory and respiratory systems: Secondary | ICD-10-CM

## 2012-09-29 DIAGNOSIS — I6529 Occlusion and stenosis of unspecified carotid artery: Secondary | ICD-10-CM

## 2012-09-29 NOTE — Progress Notes (Signed)
VASCULAR LAB PRELIMINARY  PRELIMINARY  PRELIMINARY  PRELIMINARY  Carotid duplex completed.    Preliminary report:  Right - No evidence of significant ICA stenosis. No color flow or Doppler signal noted in the right vertebral artery consistent with an occlusion.  Left - There is a 40% to 59% ICA stenosis noted in the bulb lowest end of range. Vertebral artery flow is antegrade.  Micheal Wright, RVS 09/29/2012, 1:04 PM

## 2012-10-23 DIAGNOSIS — D2330 Other benign neoplasm of skin of unspecified part of face: Secondary | ICD-10-CM | POA: Diagnosis not present

## 2012-10-23 DIAGNOSIS — C44320 Squamous cell carcinoma of skin of unspecified parts of face: Secondary | ICD-10-CM | POA: Diagnosis not present

## 2012-10-23 DIAGNOSIS — D485 Neoplasm of uncertain behavior of skin: Secondary | ICD-10-CM | POA: Diagnosis not present

## 2012-11-12 DIAGNOSIS — D0470 Carcinoma in situ of skin of unspecified lower limb, including hip: Secondary | ICD-10-CM | POA: Diagnosis not present

## 2012-12-23 DIAGNOSIS — L570 Actinic keratosis: Secondary | ICD-10-CM | POA: Diagnosis not present

## 2012-12-23 DIAGNOSIS — L97909 Non-pressure chronic ulcer of unspecified part of unspecified lower leg with unspecified severity: Secondary | ICD-10-CM | POA: Diagnosis not present

## 2012-12-24 DIAGNOSIS — H2510 Age-related nuclear cataract, unspecified eye: Secondary | ICD-10-CM | POA: Diagnosis not present

## 2012-12-29 DIAGNOSIS — L570 Actinic keratosis: Secondary | ICD-10-CM | POA: Diagnosis not present

## 2012-12-29 DIAGNOSIS — D0439 Carcinoma in situ of skin of other parts of face: Secondary | ICD-10-CM | POA: Diagnosis not present

## 2013-01-11 ENCOUNTER — Other Ambulatory Visit (HOSPITAL_COMMUNITY): Payer: Self-pay | Admitting: *Deleted

## 2013-01-11 DIAGNOSIS — E785 Hyperlipidemia, unspecified: Secondary | ICD-10-CM

## 2013-01-11 MED ORDER — ATORVASTATIN CALCIUM 80 MG PO TABS: 80.0000 mg | ORAL_TABLET | Freq: Every day | ORAL | Status: DC

## 2013-02-18 DIAGNOSIS — Z1339 Encounter for screening examination for other mental health and behavioral disorders: Secondary | ICD-10-CM | POA: Diagnosis not present

## 2013-02-18 DIAGNOSIS — Z0000 Encounter for general adult medical examination without abnormal findings: Secondary | ICD-10-CM | POA: Diagnosis not present

## 2013-06-09 ENCOUNTER — Other Ambulatory Visit (HOSPITAL_COMMUNITY): Payer: Self-pay | Admitting: *Deleted

## 2013-06-09 MED ORDER — SILDENAFIL CITRATE 100 MG PO TABS: 50.0000 mg | ORAL_TABLET | ORAL | Status: DC | PRN

## 2013-08-07 DIAGNOSIS — Z23 Encounter for immunization: Secondary | ICD-10-CM | POA: Diagnosis not present

## 2013-08-07 DIAGNOSIS — Z1339 Encounter for screening examination for other mental health and behavioral disorders: Secondary | ICD-10-CM | POA: Diagnosis not present

## 2013-08-07 DIAGNOSIS — Z125 Encounter for screening for malignant neoplasm of prostate: Secondary | ICD-10-CM | POA: Diagnosis not present

## 2013-08-07 DIAGNOSIS — Z1331 Encounter for screening for depression: Secondary | ICD-10-CM | POA: Diagnosis not present

## 2013-08-10 ENCOUNTER — Other Ambulatory Visit: Payer: Self-pay

## 2013-08-10 DIAGNOSIS — E785 Hyperlipidemia, unspecified: Secondary | ICD-10-CM

## 2013-08-10 DIAGNOSIS — H10219 Acute toxic conjunctivitis, unspecified eye: Secondary | ICD-10-CM | POA: Diagnosis not present

## 2013-08-10 MED ORDER — ATORVASTATIN CALCIUM 80 MG PO TABS: 80.0000 mg | ORAL_TABLET | Freq: Every day | ORAL | Status: DC

## 2013-08-13 DIAGNOSIS — Z86010 Personal history of colonic polyps: Secondary | ICD-10-CM | POA: Diagnosis not present

## 2013-08-17 ENCOUNTER — Telehealth: Payer: Self-pay | Admitting: *Deleted

## 2013-08-18 DIAGNOSIS — Z79899 Other long term (current) drug therapy: Secondary | ICD-10-CM | POA: Diagnosis not present

## 2013-08-18 DIAGNOSIS — R5381 Other malaise: Secondary | ICD-10-CM | POA: Diagnosis not present

## 2013-08-18 DIAGNOSIS — E7800 Pure hypercholesterolemia, unspecified: Secondary | ICD-10-CM | POA: Diagnosis not present

## 2013-08-18 DIAGNOSIS — I10 Essential (primary) hypertension: Secondary | ICD-10-CM | POA: Diagnosis not present

## 2013-08-18 MED ORDER — METOPROLOL SUCCINATE ER 25 MG PO TB24: 25.0000 mg | ORAL_TABLET | Freq: Every day | ORAL | Status: DC

## 2013-08-18 NOTE — Telephone Encounter (Signed)
Pt seen 09/13/13 by Dr Gala Romney and due for yearly f/u in Oct medication refilled

## 2013-08-19 DIAGNOSIS — I10 Essential (primary) hypertension: Secondary | ICD-10-CM | POA: Diagnosis not present

## 2013-08-19 DIAGNOSIS — R5381 Other malaise: Secondary | ICD-10-CM | POA: Diagnosis not present

## 2013-08-19 DIAGNOSIS — E7800 Pure hypercholesterolemia, unspecified: Secondary | ICD-10-CM | POA: Diagnosis not present

## 2013-09-13 ENCOUNTER — Encounter (HOSPITAL_COMMUNITY): Payer: Medicare Other

## 2013-09-21 ENCOUNTER — Ambulatory Visit (HOSPITAL_COMMUNITY)
Admission: RE | Admit: 2013-09-21 | Discharge: 2013-09-21 | Disposition: A | Discharge status: Home / Self Care | Payer: Medicare Other | Source: Ambulatory Visit | Attending: Internal Medicine | Admitting: Internal Medicine

## 2013-09-21 VITALS — BP 150/62 | HR 47 | Wt 200.5 lb

## 2013-09-21 DIAGNOSIS — K219 Gastro-esophageal reflux disease without esophagitis: Secondary | ICD-10-CM | POA: Diagnosis not present

## 2013-09-21 DIAGNOSIS — G4730 Sleep apnea, unspecified: Secondary | ICD-10-CM | POA: Insufficient documentation

## 2013-09-21 DIAGNOSIS — I6529 Occlusion and stenosis of unspecified carotid artery: Secondary | ICD-10-CM | POA: Diagnosis not present

## 2013-09-21 DIAGNOSIS — Z7982 Long term (current) use of aspirin: Secondary | ICD-10-CM | POA: Insufficient documentation

## 2013-09-21 DIAGNOSIS — I2510 Atherosclerotic heart disease of native coronary artery without angina pectoris: Secondary | ICD-10-CM | POA: Diagnosis not present

## 2013-09-21 DIAGNOSIS — E785 Hyperlipidemia, unspecified: Secondary | ICD-10-CM | POA: Diagnosis not present

## 2013-09-21 DIAGNOSIS — I498 Other specified cardiac arrhythmias: Secondary | ICD-10-CM | POA: Insufficient documentation

## 2013-09-21 DIAGNOSIS — Z951 Presence of aortocoronary bypass graft: Secondary | ICD-10-CM | POA: Insufficient documentation

## 2013-09-21 DIAGNOSIS — R5381 Other malaise: Secondary | ICD-10-CM | POA: Diagnosis not present

## 2013-09-21 DIAGNOSIS — I10 Essential (primary) hypertension: Secondary | ICD-10-CM | POA: Diagnosis not present

## 2013-09-21 MED ORDER — SILDENAFIL CITRATE 100 MG PO TABS: 100.0000 mg | ORAL_TABLET | ORAL | Status: DC | PRN

## 2013-09-21 NOTE — Progress Notes (Signed)
Patient ID: Micheal Wright, male   DOB: [birth year], 72 y.o.   MRN: [id number] PCP: Dr Sullivan Lone HPI:  Micheal Wright is a very pleasant 72 year old male with a history of coronary artery disease status post coronary artery bypass grafting in 1998.  Last catheterization was in January 2002 due to an abnormal stress test with 4 mm of ST elevation during exercise.  Cardiac catheterization showed patent grafts.  He has a history of hypertension, hyperlipidemia, and sleep apnea. Myoview 2/09 EF 61% No sichemia.   Carotid u/s 12/13  R okL 40-59% (stable).  ABI 1/11  R 1.0  L 1.1  Follow up: Doing well. Feels fatigued and sluggish, however reports if he gets up and goes then he is fine. Still active in yard. Not exercising. Has lost about 20 pounds but cutting out cookies and ice cream. Checks BP at [employer] SBP 120-140 range. HR usually 62-66.  Denies SOB/PND/Orthopnea/CP. Occasional blurry vision. No dizziness.  Compliant with medications.   Lipids checked with Dr. Sullivan Lone  08/2013 Total Cholesterol: 121 TG 82 HDL 44 LDL 61   ROS: All systems negative except as listed in HPI, PMH and Problem List.  Past Medical History  Diagnosis Date  . History of colonic polyps   . CAD (coronary artery disease)     s/p CABG 1998; Last cath 2002 in setting of marked ST elevation during stress testing. patent grafts; Echo 8/09 EF 55%. No significant valvular disease.  . Carotid artery disease     u/s 9/10: R 40-59% L 0-39%  . GERD (gastroesophageal reflux disease)   . Hyperlipidemia   . Hypertension   . Erectile dysfunction   . Sleep apnea     Current Outpatient Prescriptions  Medication Sig Dispense Refill  . aspirin 81 MG tablet Take 81 mg by mouth daily.      Marland Kitchen atorvastatin (LIPITOR) 80 MG tablet Take 1 tablet (80 mg total) by mouth daily.  30 tablet  6  . losartan (COZAAR) 100 MG tablet Take 50 mg by mouth daily.      . metoprolol succinate (TOPROL XL) 25 MG 24 hr tablet Take 1 tablet (25 mg total) by mouth  daily.  30 tablet  5  . nitroGLYCERIN (NITROSTAT) 0.4 MG SL tablet Place 1 tablet (0.4 mg total) under the tongue every 5 (five) minutes as needed. May repeat for up to 3 doses.  25 tablet  3  . RABEprazole (ACIPHEX) 20 MG tablet Take 20 mg by mouth daily.        . sildenafil (VIAGRA) 100 MG tablet Take 0.5 tablets (50 mg total) by mouth as needed.  10 tablet  6   No current facility-administered medications for this encounter.   Filed Vitals:   09/21/13 0859  BP: 102/58  Pulse: 47  Weight: 200 lb 8 oz (90.946 kg)  SpO2: 96%    PHYSICAL EXAM: General:  Well appearing. No resp difficulty HEENT: normal Neck: supple. JVP flat. Carotids 2+ bilaterally; no bruits. No lymphadenopathy or thryomegaly appreciated. Cor: PMI normal. Irregular rate & rhythm. No rubs, gallops. 2/6 murmur at RSB. Crisp s2. Lungs: clear Abdomen: soft, nontender, nondistended. No hepatosplenomegaly. No bruits or masses. Good bowel sounds. Extremities: no cyanosis, clubbing, rash, edema Neuro: alert & orientedx3, cranial nerves grossly intact. Moves all 4 extremities w/o difficulty. Affect pleasant.  ECG:  SB 47 BPM No ST-T wave abnormalities. QRS 102 ms   ASSESSMENT & PLAN:  1) CAD, s/p CABG 1998 - no s/s of  ischemia. Continue statin and ASA.  2) HTN - controlled on Toprol and Cozaar. Continue current regimen - SBP on the low side today but overall well controlled.  3) HLD - managed by Dr. Sullivan Lone. HDL improved. 4) Bradycardia - Will stop Toprol. May need PPM down the road.  5) Carotid stenosis, mild -F/u every 2 years. Due Dec 2015.  Zen Felling,MD 9:41 AM

## 2013-09-21 NOTE — Patient Instructions (Signed)
Stop Metoprolol  We will contact you in 1 year to schedule your next appointment.  

## 2013-09-21 NOTE — Addendum Note (Signed)
Encounter addended by: Noralee Space, RN on: 09/21/2013  9:51 AM<BR>     Documentation filed: Medications, Patient Instructions Section, Orders

## 2013-09-22 NOTE — Addendum Note (Signed)
Encounter addended by: Simon Rhein, CCT on: 09/22/2013  2:02 PM<BR>     Documentation filed: Charges VN

## 2013-10-08 ENCOUNTER — Ambulatory Visit: Payer: Self-pay | Admitting: Unknown Physician Specialty

## 2013-10-08 DIAGNOSIS — Z7982 Long term (current) use of aspirin: Secondary | ICD-10-CM | POA: Diagnosis not present

## 2013-10-08 DIAGNOSIS — Z860100 Personal history of colon polyps, unspecified: Secondary | ICD-10-CM | POA: Diagnosis not present

## 2013-10-08 DIAGNOSIS — D126 Benign neoplasm of colon, unspecified: Secondary | ICD-10-CM | POA: Diagnosis not present

## 2013-10-08 DIAGNOSIS — K648 Other hemorrhoids: Secondary | ICD-10-CM | POA: Diagnosis not present

## 2013-10-08 DIAGNOSIS — E669 Obesity, unspecified: Secondary | ICD-10-CM | POA: Diagnosis not present

## 2013-10-08 DIAGNOSIS — Z87891 Personal history of nicotine dependence: Secondary | ICD-10-CM | POA: Diagnosis not present

## 2013-10-08 DIAGNOSIS — Z6828 Body mass index (BMI) 28.0-28.9, adult: Secondary | ICD-10-CM | POA: Diagnosis not present

## 2013-10-08 DIAGNOSIS — Z09 Encounter for follow-up examination after completed treatment for conditions other than malignant neoplasm: Secondary | ICD-10-CM | POA: Diagnosis not present

## 2013-10-08 DIAGNOSIS — I2510 Atherosclerotic heart disease of native coronary artery without angina pectoris: Secondary | ICD-10-CM | POA: Diagnosis not present

## 2013-10-08 DIAGNOSIS — K5900 Constipation, unspecified: Secondary | ICD-10-CM | POA: Diagnosis not present

## 2013-10-08 DIAGNOSIS — I252 Old myocardial infarction: Secondary | ICD-10-CM | POA: Diagnosis not present

## 2013-10-08 DIAGNOSIS — Z79899 Other long term (current) drug therapy: Secondary | ICD-10-CM | POA: Diagnosis not present

## 2013-10-08 DIAGNOSIS — K219 Gastro-esophageal reflux disease without esophagitis: Secondary | ICD-10-CM | POA: Diagnosis not present

## 2013-10-08 DIAGNOSIS — Z951 Presence of aortocoronary bypass graft: Secondary | ICD-10-CM | POA: Diagnosis not present

## 2013-10-08 DIAGNOSIS — Z803 Family history of malignant neoplasm of breast: Secondary | ICD-10-CM | POA: Diagnosis not present

## 2013-10-12 LAB — PATHOLOGY REPORT

## 2013-10-14 ENCOUNTER — Other Ambulatory Visit (HOSPITAL_COMMUNITY): Payer: Self-pay | Admitting: *Deleted

## 2013-10-14 MED ORDER — LOSARTAN POTASSIUM 100 MG PO TABS: 50.0000 mg | ORAL_TABLET | Freq: Every day | ORAL | Status: DC

## 2013-12-15 DIAGNOSIS — L570 Actinic keratosis: Secondary | ICD-10-CM | POA: Diagnosis not present

## 2013-12-15 DIAGNOSIS — D1801 Hemangioma of skin and subcutaneous tissue: Secondary | ICD-10-CM | POA: Diagnosis not present

## 2013-12-15 DIAGNOSIS — D485 Neoplasm of uncertain behavior of skin: Secondary | ICD-10-CM | POA: Diagnosis not present

## 2013-12-22 DIAGNOSIS — C4442 Squamous cell carcinoma of skin of scalp and neck: Secondary | ICD-10-CM | POA: Diagnosis not present

## 2014-01-17 DIAGNOSIS — L570 Actinic keratosis: Secondary | ICD-10-CM | POA: Diagnosis not present

## 2014-02-11 DIAGNOSIS — H9209 Otalgia, unspecified ear: Secondary | ICD-10-CM | POA: Diagnosis not present

## 2014-02-11 DIAGNOSIS — H6120 Impacted cerumen, unspecified ear: Secondary | ICD-10-CM | POA: Diagnosis not present

## 2014-02-11 DIAGNOSIS — H903 Sensorineural hearing loss, bilateral: Secondary | ICD-10-CM | POA: Diagnosis not present

## 2014-02-11 DIAGNOSIS — H60509 Unspecified acute noninfective otitis externa, unspecified ear: Secondary | ICD-10-CM | POA: Diagnosis not present

## 2014-02-14 DIAGNOSIS — L570 Actinic keratosis: Secondary | ICD-10-CM | POA: Diagnosis not present

## 2014-02-18 DIAGNOSIS — R209 Unspecified disturbances of skin sensation: Secondary | ICD-10-CM | POA: Diagnosis not present

## 2014-02-24 ENCOUNTER — Ambulatory Visit: Payer: Self-pay | Admitting: Otolaryngology

## 2014-02-24 DIAGNOSIS — J3489 Other specified disorders of nose and nasal sinuses: Secondary | ICD-10-CM | POA: Diagnosis not present

## 2014-02-24 DIAGNOSIS — Z85828 Personal history of other malignant neoplasm of skin: Secondary | ICD-10-CM | POA: Diagnosis not present

## 2014-02-24 DIAGNOSIS — R209 Unspecified disturbances of skin sensation: Secondary | ICD-10-CM | POA: Diagnosis not present

## 2014-02-24 DIAGNOSIS — Z135 Encounter for screening for eye and ear disorders: Secondary | ICD-10-CM | POA: Diagnosis not present

## 2014-02-25 DIAGNOSIS — R209 Unspecified disturbances of skin sensation: Secondary | ICD-10-CM | POA: Diagnosis not present

## 2014-02-25 DIAGNOSIS — H903 Sensorineural hearing loss, bilateral: Secondary | ICD-10-CM | POA: Diagnosis not present

## 2014-03-10 ENCOUNTER — Other Ambulatory Visit: Payer: Self-pay

## 2014-03-10 DIAGNOSIS — E785 Hyperlipidemia, unspecified: Secondary | ICD-10-CM

## 2014-03-10 MED ORDER — ATORVASTATIN CALCIUM 80 MG PO TABS: 80.0000 mg | ORAL_TABLET | Freq: Every day | ORAL | Status: DC

## 2014-03-16 DIAGNOSIS — I2510 Atherosclerotic heart disease of native coronary artery without angina pectoris: Secondary | ICD-10-CM | POA: Diagnosis not present

## 2014-03-16 DIAGNOSIS — I10 Essential (primary) hypertension: Secondary | ICD-10-CM | POA: Diagnosis not present

## 2014-03-16 DIAGNOSIS — Z0000 Encounter for general adult medical examination without abnormal findings: Secondary | ICD-10-CM | POA: Diagnosis not present

## 2014-03-16 DIAGNOSIS — E7800 Pure hypercholesterolemia, unspecified: Secondary | ICD-10-CM | POA: Diagnosis not present

## 2014-03-23 DIAGNOSIS — L570 Actinic keratosis: Secondary | ICD-10-CM | POA: Diagnosis not present

## 2014-03-23 DIAGNOSIS — Z85828 Personal history of other malignant neoplasm of skin: Secondary | ICD-10-CM | POA: Diagnosis not present

## 2014-03-23 DIAGNOSIS — L259 Unspecified contact dermatitis, unspecified cause: Secondary | ICD-10-CM | POA: Diagnosis not present

## 2014-04-14 DIAGNOSIS — R5381 Other malaise: Secondary | ICD-10-CM | POA: Diagnosis not present

## 2014-04-14 DIAGNOSIS — G589 Mononeuropathy, unspecified: Secondary | ICD-10-CM | POA: Diagnosis not present

## 2014-04-14 DIAGNOSIS — E7800 Pure hypercholesterolemia, unspecified: Secondary | ICD-10-CM | POA: Diagnosis not present

## 2014-04-14 DIAGNOSIS — Z125 Encounter for screening for malignant neoplasm of prostate: Secondary | ICD-10-CM | POA: Diagnosis not present

## 2014-04-25 DIAGNOSIS — R404 Transient alteration of awareness: Secondary | ICD-10-CM | POA: Diagnosis not present

## 2014-04-25 DIAGNOSIS — R5381 Other malaise: Secondary | ICD-10-CM | POA: Diagnosis not present

## 2014-04-25 DIAGNOSIS — R5383 Other fatigue: Secondary | ICD-10-CM | POA: Diagnosis not present

## 2014-04-28 DIAGNOSIS — C44319 Basal cell carcinoma of skin of other parts of face: Secondary | ICD-10-CM | POA: Diagnosis not present

## 2014-04-28 DIAGNOSIS — D485 Neoplasm of uncertain behavior of skin: Secondary | ICD-10-CM | POA: Diagnosis not present

## 2014-05-04 ENCOUNTER — Ambulatory Visit (HOSPITAL_COMMUNITY)
Admission: RE | Admit: 2014-05-04 | Discharge: 2014-05-04 | Disposition: A | Discharge status: Home / Self Care | Payer: Medicare Other | Source: Ambulatory Visit | Attending: Internal Medicine | Admitting: Internal Medicine

## 2014-05-04 ENCOUNTER — Encounter (HOSPITAL_COMMUNITY): Payer: Self-pay

## 2014-05-04 VITALS — BP 124/56 | HR 62 | Wt 199.8 lb

## 2014-05-04 DIAGNOSIS — I491 Atrial premature depolarization: Secondary | ICD-10-CM | POA: Insufficient documentation

## 2014-05-04 DIAGNOSIS — E785 Hyperlipidemia, unspecified: Secondary | ICD-10-CM | POA: Insufficient documentation

## 2014-05-04 DIAGNOSIS — I779 Disorder of arteries and arterioles, unspecified: Secondary | ICD-10-CM | POA: Diagnosis not present

## 2014-05-04 DIAGNOSIS — I2510 Atherosclerotic heart disease of native coronary artery without angina pectoris: Secondary | ICD-10-CM | POA: Diagnosis not present

## 2014-05-04 DIAGNOSIS — I10 Essential (primary) hypertension: Secondary | ICD-10-CM | POA: Diagnosis not present

## 2014-05-04 DIAGNOSIS — Z951 Presence of aortocoronary bypass graft: Secondary | ICD-10-CM | POA: Diagnosis not present

## 2014-05-04 DIAGNOSIS — I498 Other specified cardiac arrhythmias: Secondary | ICD-10-CM | POA: Diagnosis not present

## 2014-05-04 HISTORY — DX: Atrial premature depolarization: I49.1

## 2014-05-04 NOTE — Patient Instructions (Signed)
We will contact you in 6 months to schedule your next appointment.  

## 2014-05-04 NOTE — Addendum Note (Signed)
Encounter addended by: Scarlette Calico, RN on: 05/04/2014 12:25 PM<BR>     Documentation filed: Patient Instructions Section

## 2014-05-04 NOTE — Progress Notes (Signed)
Patient ID: Micheal Wright, male   DOB: 10-Apr-1941, 73 y.o.   MRN: [id number] PCP: Dr Micheal Wright HPI:  Micheal Wright is a very pleasant 73 year old male with a history of coronary artery disease status post coronary artery bypass grafting in 1998.  Last catheterization was in January 2002 due to an abnormal stress test with 4 mm of ST elevation during exercise.  Cardiac catheterization showed patent grafts.  He has a history of hypertension, hyperlipidemia, and sleep apnea. Myoview 2/09 EF 61% No sichemia.   He returns for follow up. 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 Had EKG on Monday at the fire station. Showed HR 94 with frequent PACs and PVCs. Was concerned and came in today for visit. Feels great. No CP.   Carotid u/s 12/13  R okL 40-59% (stable).  ABI 1/11  R 1.0  L 1.1  Lipids checked with Dr. [name]  08/2013 Total Cholesterol: 121 TG 82 HDL 44 LDL 61   ROS: All systems negative except as listed in HPI, PMH and Problem List.  Past Medical History  Diagnosis Date  . History of colonic polyps   . CAD (coronary artery disease)     s/p CABG 1998; Last cath 2002 in setting of marked ST elevation during stress testing. patent grafts; Echo 8/09 EF 55%. No significant valvular disease.  . Carotid artery disease     u/s 9/10: R 40-59% L 0-39%  . GERD (gastroesophageal reflux disease)   . Hyperlipidemia   . Hypertension   . Erectile dysfunction   . Sleep apnea     Current Outpatient Prescriptions  Medication Sig Dispense Refill  . aspirin 81 MG tablet Take 81 mg by mouth daily.      Marland Kitchen atorvastatin (LIPITOR) 80 MG tablet Take 1 tablet (80 mg total) by mouth daily.  30 tablet  6  . losartan (COZAAR) 100 MG tablet Take 0.5 tablets (50 mg total) by mouth daily.  15 tablet  12  . nitroGLYCERIN (NITROSTAT) 0.4 MG SL tablet Place 1 tablet (0.4 mg total)  under the tongue every 5 (five) minutes as needed. May repeat for up to 3 doses.  25 tablet  3  . omeprazole (PRILOSEC) 40 MG capsule Take 40 mg by mouth daily.      . sildenafil (VIAGRA) 100 MG tablet Take 1 tablet (100 mg total) by mouth as needed.  10 tablet  12   No current facility-administered medications for this encounter.   Filed Vitals:   05/04/14 1139  BP: 124/56  Pulse: 62  Weight: 199 lb 12.8 oz (90.629 kg)  SpO2: 98%    PHYSICAL EXAM: General:  Well appearing. No resp difficulty HEENT: normal Neck: supple. JVP flat. Carotids 2+ bilaterally; no bruits. No lymphadenopathy or thryomegaly appreciated. Cor: PMI normal. Irregular rate & rhythm. No rubs, gallops. 2/6 murmur at RSB. Crisp s2. Lungs: clear Abdomen: soft, nontender, nondistended. No hepatosplenomegaly. No bruits or masses. Good bowel sounds. Extremities: no cyanosis, clubbing, rash, edema Neuro: alert & orientedx3, cranial nerves grossly intact. Moves all 4 extremities w/o difficulty. Affect pleasant.  ECG:  NSR 62 No ST-T wave abnormalities.    ASSESSMENT & PLAN:  1) CAD, s/p CABG 1998 - no s/s of ischemia. Continue statin and ASA.  2) HTN - controlled on Toprol and  Cozaar. Continue current regimen - SBP on the low side today but overall well controlled.  3) HLD - managed by Dr. [name]. HDL improved. 4) Bradycardia - Will stop Toprol. May need PPM down the road.  5) Carotid stenosis, mild -F/u every 2 years. Due Dec 2015.  Micheal D Clegg,NP-C  11:44 AM  Patient seen and examined with Micheal Grinder, NP. We discussed all aspects of the encounter. I agree with the assessment and plan as stated above. ECGs look fine. No signs/sx of ischemia. Continue current therapy.   Micheal Pascal Radhika Dershem,MD 12:20 PM

## 2014-05-05 NOTE — Addendum Note (Signed)
Encounter addended by: Asencion Gowda, CCT on: 05/05/2014  8:49 AM<BR>     Documentation filed: Charges VN

## 2014-05-20 DIAGNOSIS — C44319 Basal cell carcinoma of skin of other parts of face: Secondary | ICD-10-CM | POA: Diagnosis not present

## 2014-07-22 ENCOUNTER — Other Ambulatory Visit (HOSPITAL_COMMUNITY): Payer: Self-pay

## 2014-07-22 MED ORDER — SILDENAFIL CITRATE 100 MG PO TABS: 100.0000 mg | ORAL_TABLET | ORAL | Status: DC | PRN

## 2014-07-27 ENCOUNTER — Telehealth (HOSPITAL_COMMUNITY): Payer: Self-pay | Admitting: Cardiology

## 2014-07-27 DIAGNOSIS — N529 Male erectile dysfunction, unspecified: Secondary | ICD-10-CM

## 2014-07-27 MED ORDER — SILDENAFIL CITRATE 50 MG PO TABS: 100.0000 mg | ORAL_TABLET | ORAL | Status: DC | PRN

## 2014-07-27 NOTE — Telephone Encounter (Signed)
Pharmacy has a program/ offer for sildenafil 20 mg #50 for $50 Pharmacy would like to know if this is ok to fill for pt vs sildenafil 100 mg #10

## 2014-07-27 NOTE — Addendum Note (Signed)
Addended by: Kerry Dory on: 07/27/2014 04:05 PM   Modules accepted: Orders

## 2014-07-27 NOTE — Telephone Encounter (Signed)
ok 

## 2014-07-27 NOTE — Telephone Encounter (Signed)
New rx sent to pharmacy

## 2014-08-04 ENCOUNTER — Other Ambulatory Visit (HOSPITAL_COMMUNITY): Payer: Self-pay | Admitting: *Deleted

## 2014-08-04 DIAGNOSIS — N529 Male erectile dysfunction, unspecified: Secondary | ICD-10-CM

## 2014-08-04 MED ORDER — SILDENAFIL CITRATE 20 MG PO TABS: 40.0000 mg | ORAL_TABLET | ORAL | Status: DC | PRN

## 2014-08-15 ENCOUNTER — Other Ambulatory Visit (HOSPITAL_COMMUNITY): Payer: Self-pay | Admitting: Cardiology

## 2014-08-15 DIAGNOSIS — I2510 Atherosclerotic heart disease of native coronary artery without angina pectoris: Secondary | ICD-10-CM

## 2014-08-15 MED ORDER — NITROGLYCERIN 0.4 MG SL SUBL: 0.4000 mg | SUBLINGUAL_TABLET | SUBLINGUAL | Status: DC | PRN

## 2014-08-19 DIAGNOSIS — T1500XA Foreign body in cornea, unspecified eye, initial encounter: Secondary | ICD-10-CM | POA: Diagnosis not present

## 2014-08-19 DIAGNOSIS — H5710 Ocular pain, unspecified eye: Secondary | ICD-10-CM | POA: Diagnosis not present

## 2014-09-21 DIAGNOSIS — C44519 Basal cell carcinoma of skin of other part of trunk: Secondary | ICD-10-CM | POA: Diagnosis not present

## 2014-09-21 DIAGNOSIS — L570 Actinic keratosis: Secondary | ICD-10-CM | POA: Diagnosis not present

## 2014-09-21 DIAGNOSIS — Z85828 Personal history of other malignant neoplasm of skin: Secondary | ICD-10-CM | POA: Diagnosis not present

## 2014-09-21 DIAGNOSIS — X32XXXA Exposure to sunlight, initial encounter: Secondary | ICD-10-CM | POA: Diagnosis not present

## 2014-09-21 DIAGNOSIS — L821 Other seborrheic keratosis: Secondary | ICD-10-CM | POA: Diagnosis not present

## 2014-09-21 DIAGNOSIS — D485 Neoplasm of uncertain behavior of skin: Secondary | ICD-10-CM | POA: Diagnosis not present

## 2014-09-27 DIAGNOSIS — Z23 Encounter for immunization: Secondary | ICD-10-CM | POA: Diagnosis not present

## 2014-09-30 DIAGNOSIS — C4441 Basal cell carcinoma of skin of scalp and neck: Secondary | ICD-10-CM | POA: Diagnosis not present

## 2014-09-30 DIAGNOSIS — C44519 Basal cell carcinoma of skin of other part of trunk: Secondary | ICD-10-CM | POA: Diagnosis not present

## 2014-10-17 ENCOUNTER — Other Ambulatory Visit (HOSPITAL_COMMUNITY): Payer: Self-pay | Admitting: Cardiology

## 2014-10-17 ENCOUNTER — Other Ambulatory Visit (HOSPITAL_COMMUNITY): Payer: Self-pay | Admitting: *Deleted

## 2014-10-17 DIAGNOSIS — I2510 Atherosclerotic heart disease of native coronary artery without angina pectoris: Secondary | ICD-10-CM

## 2014-10-17 MED ORDER — ATORVASTATIN CALCIUM 80 MG PO TABS: 80.0000 mg | ORAL_TABLET | Freq: Every day | ORAL | Status: DC

## 2014-11-04 ENCOUNTER — Other Ambulatory Visit (HOSPITAL_COMMUNITY): Payer: Self-pay | Admitting: Cardiology

## 2014-11-04 DIAGNOSIS — I5022 Chronic systolic (congestive) heart failure: Secondary | ICD-10-CM

## 2014-11-04 MED ORDER — LOSARTAN POTASSIUM 100 MG PO TABS: 50.0000 mg | ORAL_TABLET | Freq: Every day | ORAL | Status: DC

## 2014-12-05 DIAGNOSIS — M65342 Trigger finger, left ring finger: Secondary | ICD-10-CM | POA: Diagnosis not present

## 2015-01-10 DIAGNOSIS — N477 Other inflammatory diseases of prepuce: Secondary | ICD-10-CM | POA: Diagnosis not present

## 2015-01-20 ENCOUNTER — Ambulatory Visit (HOSPITAL_COMMUNITY)
Admission: RE | Admit: 2015-01-20 | Discharge: 2015-01-20 | Disposition: A | Discharge status: Home / Self Care | Payer: Medicare Other | Source: Ambulatory Visit | Attending: Internal Medicine | Admitting: Internal Medicine

## 2015-01-20 ENCOUNTER — Encounter (HOSPITAL_COMMUNITY): Payer: Self-pay

## 2015-01-20 VITALS — BP 120/66 | HR 64 | Wt 207.4 lb

## 2015-01-20 DIAGNOSIS — I2510 Atherosclerotic heart disease of native coronary artery without angina pectoris: Secondary | ICD-10-CM | POA: Diagnosis not present

## 2015-01-20 DIAGNOSIS — R011 Cardiac murmur, unspecified: Secondary | ICD-10-CM | POA: Diagnosis not present

## 2015-01-20 DIAGNOSIS — R0789 Other chest pain: Secondary | ICD-10-CM

## 2015-01-20 DIAGNOSIS — I2583 Coronary atherosclerosis due to lipid rich plaque: Secondary | ICD-10-CM

## 2015-01-20 DIAGNOSIS — I6523 Occlusion and stenosis of bilateral carotid arteries: Secondary | ICD-10-CM

## 2015-01-20 HISTORY — DX: Other chest pain: R07.89

## 2015-01-20 NOTE — Progress Notes (Signed)
Patient ID: Micheal Wright, male   DOB: 1941-08-02, 74 y.o.   MRN: [id number] PCP: Dr Rosanna Randy HPI:  Micheal Wright is a very pleasant 74 year old male with a history of coronary artery disease status post coronary artery bypass grafting in 1998.  Last catheterization was in January 2002 due to an abnormal stress test with 4 mm of ST elevation during exercise.  Cardiac catheterization showed patent grafts.  He has a history of hypertension, hyperlipidemia, and sleep apnea. Myoview 2/09 EF 61% No ischemia.   He returns for follow up. 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  Occasionally notices a little exertional chest tightness. No focal neuro sx. No dyspnea, edema, PND.    Carotid u/s 12/13  R okL 40-59% (stable).  ABI 1/11  R 1.0  L 1.1  Lipids checked with Dr. Rosanna Randy  08/2013 Total Cholesterol: 121 TG 82 HDL 44 LDL 61   ROS: All systems negative except as listed in HPI, PMH and Problem List.  Past Medical History  Diagnosis Date  . History of colonic polyps   . CAD (coronary artery disease)     s/p CABG 1998; Last cath 2002 in setting of marked ST elevation during stress testing. patent grafts; Echo 8/09 EF 55%. No significant valvular disease.  . Carotid artery disease     u/s 9/10: R 40-59% L 0-39%  . GERD (gastroesophageal reflux disease)   . Hyperlipidemia   . Hypertension   . Erectile dysfunction   . Sleep apnea     Current Outpatient Prescriptions  Medication Sig Dispense Refill  . aspirin 81 MG tablet Take 81 mg by mouth daily.    Marland Kitchen atorvastatin (LIPITOR) 80 MG tablet Take 1 tablet (80 mg total) by mouth daily. 30 tablet 0  . losartan (COZAAR) 100 MG tablet Take 0.5 tablets (50 mg total) by mouth daily. 15 tablet 12  . nitroGLYCERIN (NITROSTAT) 0.4 MG SL tablet Place 1 tablet (0.4 mg total) under the tongue every 5 (five) minutes as needed. May repeat for up to 3 doses. 25 tablet 3  . omeprazole (PRILOSEC) 40 MG capsule Take 40 mg by mouth  daily.    . sildenafil (REVATIO) 20 MG tablet Take 2-5 tablets (40-100 mg total) by mouth as needed. 50 tablet 3   No current facility-administered medications for this encounter.   Filed Vitals:   01/20/15 0836  BP: 120/66  Pulse: 64  Weight: 207 lb 6.4 oz (94.076 kg)  SpO2: 99%    PHYSICAL EXAM: General:  Well appearing. No resp difficulty HEENT: normal Neck: supple. JVP flat. Carotids 2+ bilaterally; no bruits. No lymphadenopathy or thryomegaly appreciated. Cor: PMI normal. Irregular rate & rhythm. No rubs, gallops. 2/6 murmur at RSB. Crisp s2. Lungs: clear Abdomen: soft, nontender, nondistended. No hepatosplenomegaly. No bruits or masses. Good bowel sounds. Extremities: no cyanosis, clubbing, rash, edema Neuro: alert & orientedx3, cranial nerves grossly intact. Moves all 4 extremities w/o difficulty. Affect pleasant.  ECG:  NSR 62 No ST-T wave abnormalities.    ASSESSMENT & PLAN:  1) CAD, s/p CABG 1998 - Occasional exertional chest tightness. He is 18 years out from CABG. Unable to walk fast due to neuropathy. Will get Myoview.  Continue statin and ASA.  2) HTN - Well controlled. Continue current regimen 3) HLD - managed by Dr. Rosanna Randy. HDL improved. 4) Bradycardia - Stopped Toprol. Doing well. 5) Carotid stenosis, mild -F/u every 2 years. Due now 6) Aortic valve murmur -  Check echo  [name]  8:46 AM

## 2015-01-20 NOTE — Addendum Note (Signed)
Encounter addended by: Kerry Dory, CMA on: 01/20/2015  9:07 AM<BR>     Documentation filed: Dx Association, Patient Instructions Section, Orders

## 2015-01-20 NOTE — Patient Instructions (Signed)
Your physician has requested that you have a carotid duplex. This test is an ultrasound of the carotid arteries in your neck. It looks at blood flow through these arteries that supply the brain with blood. Allow one hour for this exam. There are no restrictions or special instructions.  Your physician has requested that you have an echocardiogram. Echocardiography is a painless test that uses sound waves to create images of your heart. It provides your doctor with information about the size and shape of your heart and how well your heart's chambers and valves are working. This procedure takes approximately one hour. There are no restrictions for this procedure.  Your physician has requested that you have a lexiscan myoview. For further information please visit [email or web address]. Please follow instruction sheet, as given.  Your physician recommends that you schedule a follow-up appointment in: 1 year

## 2015-01-22 NOTE — Addendum Note (Signed)
Encounter addended by: Yehuda Mao on: 01/22/2015 12:18 PM<BR>     Documentation filed: Charges VN

## 2015-01-31 ENCOUNTER — Ambulatory Visit (HOSPITAL_COMMUNITY): Payer: Medicare Other | Attending: Cardiology | Admitting: Radiology

## 2015-01-31 DIAGNOSIS — I2510 Atherosclerotic heart disease of native coronary artery without angina pectoris: Secondary | ICD-10-CM | POA: Diagnosis not present

## 2015-01-31 DIAGNOSIS — R0789 Other chest pain: Secondary | ICD-10-CM | POA: Insufficient documentation

## 2015-01-31 MED ORDER — TECHNETIUM TC 99M SESTAMIBI GENERIC - CARDIOLITE
11.0000 | Freq: Once | INTRAVENOUS | Status: AC | PRN
  Administered 2015-01-31: 11 via INTRAVENOUS

## 2015-01-31 MED ORDER — TECHNETIUM TC 99M SESTAMIBI GENERIC - CARDIOLITE
33.0000 | Freq: Once | INTRAVENOUS | Status: AC | PRN
  Administered 2015-01-31: 33 via INTRAVENOUS

## 2015-01-31 MED ORDER — REGADENOSON 0.4 MG/5ML IV SOLN
0.4000 mg | Freq: Once | INTRAVENOUS | Status: AC
  Administered 2015-01-31: 0.4 mg via INTRAVENOUS

## 2015-01-31 NOTE — Progress Notes (Signed)
[facility] SITE 3 NUCLEAR MED [place],  [address] [phone]    Cardiology Nuclear Med Study  Micheal Wright is a 74 y.o. male     MRN : [id number]     DOB: [birth year]  Procedure Date: 01/31/2015  Nuclear Med Background Indication for Stress Test:  Evaluation for Ischemia and Graft Patency History:  CAD-CABG, 01/2008 MPI: EF: 61% NL  Cardiac Risk Factors: Carotid Disease, Hypertension and Lipids  Symptoms:  Chest Tightness and SOB   Nuclear Pre-Procedure Caffeine/Decaff Intake:  None> 12 hrs NPO After: 7:00pm   Lungs:  clear O2 Sat: 96% on room air. IV 0.9% NS with Angio Cath:  22g  IV Site: R Wrist x 1, tolerated well IV Started by:  Irven [place], RN  Chest Size (in):  46 Cup Size: n/a  Height: 5\' 10"  (1.778 m)  Weight:  204 lb (92.534 kg)  BMI:  Body mass index is 29.27 kg/(m^2). Tech Comments:  Patient took Losartan this am. Irven [place], RN.    Nuclear Med Study 1 or 2 day study: 1 day  Stress Test Type:  Lexiscan  Reading MD: N/A  Order Authorizing Provider:  [name], MD  Resting Radionuclide: Technetium 19m Sestamibi  Resting Radionuclide Dose: 11.0 mCi   Stress Radionuclide:  Technetium 79m Sestamibi  Stress Radionuclide Dose: 33.0 mCi           Stress Protocol Rest HR: 58 Stress HR: 85  Rest BP: 121/59 Stress BP: 137/59  Exercise Time (min): n/a METS: n/a   Predicted Max HR: 147 bpm % Max HR: 57.82 bpm Rate Pressure Product: 56387   Dose of Adenosine (mg):  n/a Dose of Lexiscan: 0.4 mg  Dose of Atropine (mg): n/a Dose of Dobutamine: n/a mcg/kg/min (at max HR)  Stress Test Technologist: Perrin Maltese, EMT-P  Nuclear Technologist:  Corneilus Many, CNMT     Rest Procedure:  Myocardial perfusion imaging was performed at rest 45 minutes following the intravenous administration of Technetium 68m Sestamibi. Rest ECG: Sinus bradycardia with PAC's  Stress Procedure:  The patient received IV Lexiscan 0.4 mg over  15-seconds.  Technetium 22m Sestamibi injected at 30-seconds. This patient had sob, abdominal tightness, chest discomfort, and was lt. Headed with the Lexiscan injection. Quantitative spect images were obtained after a 45 minute delay. Stress ECG: No significant change from baseline ECG  QPS Raw Data Images:  Normal; no motion artifact; normal heart/lung ratio. Stress Images:  There is decreased uptake in the lateral wall. Rest Images:  There is decreased uptake in the lateral wall. Subtraction (SDS):  There is a fixed defect that is most consistent with a previous infarction. Transient Ischemic Dilatation (Normal <1.22):  1.04 Lung/Heart Ratio (Normal <0.45):  0.24  Quantitative Gated Spect Images QGS EDV:  104 ml QGS ESV:  47 ml  Impression Exercise Capacity:  Lexiscan with no exercise. BP Response:  Normal blood pressure response. Clinical Symptoms:  dyspnea, abdominal tightness, chest heaviness ECG Impression:  No significant ECG changes with Lexiscan. Comparison with Prior Nuclear Study: No previous nuclear study performed  Overall Impression:  Low risk stress nuclear study with moderate-sized and intensity, fixed basal to mid inferolateral perfusion defect which likely represents prior scar.  LV Ejection Fraction: 55%.  LV Wall Motion:  Hyperdynamic left ventricle with inferolateral hypokinesis to akinesis   Pixie Casino, MD, [facility] Board Certified in Nuclear Cardiology Attending Cardiologist [place]

## 2015-02-06 ENCOUNTER — Other Ambulatory Visit (HOSPITAL_COMMUNITY): Payer: Self-pay | Admitting: Internal Medicine

## 2015-02-06 ENCOUNTER — Ambulatory Visit (HOSPITAL_COMMUNITY)
Admission: RE | Admit: 2015-02-06 | Discharge: 2015-02-06 | Disposition: A | Discharge status: Home / Self Care | Payer: Medicare Other | Source: Ambulatory Visit | Attending: Internal Medicine | Admitting: Internal Medicine

## 2015-02-06 DIAGNOSIS — R011 Cardiac murmur, unspecified: Secondary | ICD-10-CM

## 2015-02-06 DIAGNOSIS — I081 Rheumatic disorders of both mitral and tricuspid valves: Secondary | ICD-10-CM | POA: Diagnosis not present

## 2015-02-06 DIAGNOSIS — I2510 Atherosclerotic heart disease of native coronary artery without angina pectoris: Secondary | ICD-10-CM | POA: Diagnosis not present

## 2015-02-06 DIAGNOSIS — E785 Hyperlipidemia, unspecified: Secondary | ICD-10-CM | POA: Diagnosis not present

## 2015-02-06 DIAGNOSIS — I10 Essential (primary) hypertension: Secondary | ICD-10-CM | POA: Insufficient documentation

## 2015-02-06 DIAGNOSIS — I2583 Coronary atherosclerosis due to lipid rich plaque: Secondary | ICD-10-CM

## 2015-02-06 DIAGNOSIS — I6523 Occlusion and stenosis of bilateral carotid arteries: Secondary | ICD-10-CM | POA: Diagnosis not present

## 2015-02-06 NOTE — Progress Notes (Signed)
VASCULAR LAB PRELIMINARY  PRELIMINARY  PRELIMINARY  PRELIMINARY  Carotid Dopplers completed.    Preliminary report:  1-39% ICA stenosis.  Vertebral artery flow is antegrade.   Coralee Edberg, RVT 02/06/2015, 9:44 AM

## 2015-02-06 NOTE — Progress Notes (Signed)
  Echocardiogram 2D Echocardiogram has been performed.  Micheal Wright, Micheal Wright 02/06/2015, 10:25 AM

## 2015-02-15 ENCOUNTER — Telehealth (HOSPITAL_COMMUNITY): Payer: Self-pay | Admitting: Vascular Surgery

## 2015-02-15 NOTE — Telephone Encounter (Signed)
PT called for results Echo / carotid doppler.. Please advise

## 2015-02-16 NOTE — Telephone Encounter (Signed)
Reviewed echo and carotid results with pt

## 2015-03-21 DIAGNOSIS — Z0000 Encounter for general adult medical examination without abnormal findings: Secondary | ICD-10-CM | POA: Diagnosis not present

## 2015-03-21 DIAGNOSIS — Z125 Encounter for screening for malignant neoplasm of prostate: Secondary | ICD-10-CM | POA: Diagnosis not present

## 2015-03-21 DIAGNOSIS — Z1389 Encounter for screening for other disorder: Secondary | ICD-10-CM | POA: Diagnosis not present

## 2015-03-22 DIAGNOSIS — D485 Neoplasm of uncertain behavior of skin: Secondary | ICD-10-CM | POA: Diagnosis not present

## 2015-03-22 DIAGNOSIS — S30860A Insect bite (nonvenomous) of lower back and pelvis, initial encounter: Secondary | ICD-10-CM | POA: Diagnosis not present

## 2015-03-22 DIAGNOSIS — L821 Other seborrheic keratosis: Secondary | ICD-10-CM | POA: Diagnosis not present

## 2015-03-22 DIAGNOSIS — C44519 Basal cell carcinoma of skin of other part of trunk: Secondary | ICD-10-CM | POA: Diagnosis not present

## 2015-03-22 DIAGNOSIS — L570 Actinic keratosis: Secondary | ICD-10-CM | POA: Diagnosis not present

## 2015-03-22 DIAGNOSIS — W891XXA Exposure to tanning bed, initial encounter: Secondary | ICD-10-CM | POA: Diagnosis not present

## 2015-03-22 DIAGNOSIS — Z85828 Personal history of other malignant neoplasm of skin: Secondary | ICD-10-CM | POA: Diagnosis not present

## 2015-04-06 DIAGNOSIS — D485 Neoplasm of uncertain behavior of skin: Secondary | ICD-10-CM | POA: Diagnosis not present

## 2015-04-06 DIAGNOSIS — C44519 Basal cell carcinoma of skin of other part of trunk: Secondary | ICD-10-CM | POA: Diagnosis not present

## 2015-04-07 DIAGNOSIS — L989 Disorder of the skin and subcutaneous tissue, unspecified: Secondary | ICD-10-CM | POA: Diagnosis not present

## 2015-04-12 DIAGNOSIS — M65342 Trigger finger, left ring finger: Secondary | ICD-10-CM | POA: Diagnosis not present

## 2015-04-17 DIAGNOSIS — Z23 Encounter for immunization: Secondary | ICD-10-CM | POA: Diagnosis not present

## 2015-04-17 DIAGNOSIS — I10 Essential (primary) hypertension: Secondary | ICD-10-CM | POA: Diagnosis not present

## 2015-04-17 DIAGNOSIS — K219 Gastro-esophageal reflux disease without esophagitis: Secondary | ICD-10-CM | POA: Diagnosis not present

## 2015-04-17 DIAGNOSIS — I2510 Atherosclerotic heart disease of native coronary artery without angina pectoris: Secondary | ICD-10-CM | POA: Diagnosis not present

## 2015-04-17 DIAGNOSIS — E780 Pure hypercholesterolemia: Secondary | ICD-10-CM | POA: Diagnosis not present

## 2015-04-17 LAB — TSH: TSH: 1.36 u[IU]/mL (ref 0.41–5.90)

## 2015-04-17 LAB — BASIC METABOLIC PANEL
Creatinine: 1 mg/dL (ref 0.6–1.3)
Glucose: 107 mg/dL

## 2015-04-17 LAB — LIPID PANEL
CHOLESTEROL: 135 mg/dL (ref 0–200)
HDL: 44 mg/dL (ref 35–70)
LDL Cholesterol: 67 mg/dL
LDl/HDL Ratio: 1.5

## 2015-04-17 LAB — HEPATIC FUNCTION PANEL: BILIRUBIN, TOTAL: 1 mg/dL

## 2015-06-07 ENCOUNTER — Other Ambulatory Visit (HOSPITAL_COMMUNITY): Payer: Self-pay | Admitting: *Deleted

## 2015-06-07 DIAGNOSIS — I2510 Atherosclerotic heart disease of native coronary artery without angina pectoris: Secondary | ICD-10-CM

## 2015-06-07 MED ORDER — ATORVASTATIN CALCIUM 80 MG PO TABS: 80.0000 mg | ORAL_TABLET | Freq: Every day | ORAL | Status: DC

## 2015-07-10 ENCOUNTER — Other Ambulatory Visit (HOSPITAL_COMMUNITY): Payer: Self-pay | Admitting: *Deleted

## 2015-07-10 DIAGNOSIS — I2510 Atherosclerotic heart disease of native coronary artery without angina pectoris: Secondary | ICD-10-CM

## 2015-07-10 MED ORDER — ATORVASTATIN CALCIUM 80 MG PO TABS: 80.0000 mg | ORAL_TABLET | Freq: Every day | ORAL | Status: DC

## 2015-08-14 ENCOUNTER — Other Ambulatory Visit (HOSPITAL_COMMUNITY): Payer: Self-pay | Admitting: *Deleted

## 2015-08-14 DIAGNOSIS — I2510 Atherosclerotic heart disease of native coronary artery without angina pectoris: Secondary | ICD-10-CM

## 2015-08-14 MED ORDER — ATORVASTATIN CALCIUM 80 MG PO TABS: 80.0000 mg | ORAL_TABLET | Freq: Every day | ORAL | Status: DC

## 2015-08-15 ENCOUNTER — Other Ambulatory Visit: Payer: Self-pay

## 2015-08-16 ENCOUNTER — Other Ambulatory Visit (HOSPITAL_COMMUNITY): Payer: Self-pay | Admitting: *Deleted

## 2015-08-16 DIAGNOSIS — I2510 Atherosclerotic heart disease of native coronary artery without angina pectoris: Secondary | ICD-10-CM

## 2015-08-16 MED ORDER — ATORVASTATIN CALCIUM 80 MG PO TABS: 80.0000 mg | ORAL_TABLET | Freq: Every day | ORAL | Status: DC

## 2015-09-04 DIAGNOSIS — S60922A Unspecified superficial injury of left hand, initial encounter: Secondary | ICD-10-CM | POA: Diagnosis not present

## 2015-09-04 DIAGNOSIS — L239 Allergic contact dermatitis, unspecified cause: Secondary | ICD-10-CM | POA: Diagnosis not present

## 2015-09-13 ENCOUNTER — Ambulatory Visit (INDEPENDENT_AMBULATORY_CARE_PROVIDER_SITE_OTHER): Payer: Medicare Other

## 2015-09-13 DIAGNOSIS — Z23 Encounter for immunization: Secondary | ICD-10-CM

## 2015-09-20 DIAGNOSIS — X32XXXA Exposure to sunlight, initial encounter: Secondary | ICD-10-CM | POA: Diagnosis not present

## 2015-09-20 DIAGNOSIS — D0439 Carcinoma in situ of skin of other parts of face: Secondary | ICD-10-CM | POA: Diagnosis not present

## 2015-09-20 DIAGNOSIS — L570 Actinic keratosis: Secondary | ICD-10-CM | POA: Diagnosis not present

## 2015-09-20 DIAGNOSIS — Z85828 Personal history of other malignant neoplasm of skin: Secondary | ICD-10-CM | POA: Diagnosis not present

## 2015-09-20 DIAGNOSIS — D485 Neoplasm of uncertain behavior of skin: Secondary | ICD-10-CM | POA: Diagnosis not present

## 2015-09-20 DIAGNOSIS — L821 Other seborrheic keratosis: Secondary | ICD-10-CM | POA: Diagnosis not present

## 2015-09-29 DIAGNOSIS — L905 Scar conditions and fibrosis of skin: Secondary | ICD-10-CM | POA: Diagnosis not present

## 2015-09-29 DIAGNOSIS — D0439 Carcinoma in situ of skin of other parts of face: Secondary | ICD-10-CM | POA: Diagnosis not present

## 2015-10-02 DIAGNOSIS — H2513 Age-related nuclear cataract, bilateral: Secondary | ICD-10-CM | POA: Diagnosis not present

## 2015-10-06 DIAGNOSIS — Z86010 Personal history of colonic polyps: Secondary | ICD-10-CM | POA: Diagnosis not present

## 2015-10-16 ENCOUNTER — Encounter: Payer: Self-pay | Admitting: Family Medicine

## 2015-10-16 ENCOUNTER — Ambulatory Visit (INDEPENDENT_AMBULATORY_CARE_PROVIDER_SITE_OTHER): Payer: Medicare Other | Admitting: Family Medicine

## 2015-10-16 VITALS — BP 144/84 | HR 72 | Temp 97.7°F | Resp 16 | Wt 207.0 lb

## 2015-10-16 DIAGNOSIS — K219 Gastro-esophageal reflux disease without esophagitis: Secondary | ICD-10-CM

## 2015-10-16 DIAGNOSIS — G629 Polyneuropathy, unspecified: Secondary | ICD-10-CM | POA: Insufficient documentation

## 2015-10-16 DIAGNOSIS — N138 Other obstructive and reflux uropathy: Secondary | ICD-10-CM | POA: Insufficient documentation

## 2015-10-16 DIAGNOSIS — N401 Enlarged prostate with lower urinary tract symptoms: Secondary | ICD-10-CM

## 2015-10-16 DIAGNOSIS — I10 Essential (primary) hypertension: Secondary | ICD-10-CM | POA: Diagnosis not present

## 2015-10-16 DIAGNOSIS — E7800 Pure hypercholesterolemia, unspecified: Secondary | ICD-10-CM

## 2015-10-16 DIAGNOSIS — C4491 Basal cell carcinoma of skin, unspecified: Secondary | ICD-10-CM | POA: Insufficient documentation

## 2015-10-16 DIAGNOSIS — I6523 Occlusion and stenosis of bilateral carotid arteries: Secondary | ICD-10-CM | POA: Diagnosis not present

## 2015-10-16 DIAGNOSIS — M545 Low back pain: Secondary | ICD-10-CM

## 2015-10-16 NOTE — Progress Notes (Signed)
Patient ID: Micheal Wright, male   DOB: [birth year], 74 y.o.   MRN: [id number]    Subjective:  HPI  Hypertension, follow-up:  BP Readings from Last 3 Encounters:  01/31/15 121/59  01/20/15 120/66  05/04/14 124/56    He was last seen for hypertension 6 months ago.  BP at that visit was 108/62. Management since that visit includes none. He reports good compliance with treatment. He is not having side effects.  He is not exercising. Outside blood pressures are not being checked, only sometimes at the fire department. Patient denies chest pain, chest pressure/discomfort, dyspnea, exertional chest pressure/discomfort, fatigue, irregular heart beat and palpitations.   Cardiovascular risk factors include advanced age (older than 29 for men, 35 for women), dyslipidemia, hypertension and male gender.   Wt Readings from Last 3 Encounters:  01/31/15 204 lb (92.534 kg)  01/20/15 207 lb 6.4 oz (94.076 kg)  05/04/14 199 lb 12.8 oz (90.629 kg)    ------------------------------------------------------------------------   GERD, Follow up:  The patient was last seen for GERD 6 months ago. Changes made since that visit include none.  He reports good compliance with treatment. He is not having side effects.  Controlled on Omeprazole. ------------------------------------------------------------------------    Lipid/Cholesterol, Follow-up:   Last seen for this 6 months ago.  Management changes since that visit include none. . Last Lipid Panel:    Component Value Date/Time   CHOL 93* 02/12/2012 0835   CHOL 100 10/16/2010 2245   TRIG 85 02/12/2012 0835   HDL 39* 02/12/2012 0835   HDL 38* 10/16/2010 2245   CHOLHDL 2.4 02/12/2012 0835   CHOLHDL 2.6 Ratio 10/16/2010 2245   VLDL 15 10/16/2010 2245   LDLCALC 37 02/12/2012 0835   LDLCALC 47 10/16/2010 2245    He reports good compliance with treatment. He is not having side effects. Current symptoms include none. Current exercise:  none  Wt Readings from Last 3 Encounters:  01/31/15 204 lb (92.534 kg)  01/20/15 207 lb 6.4 oz (94.076 kg)  05/04/14 199 lb 12.8 oz (90.629 kg)    -------------------------------------------------------------------   Myalgias in his back and in his leg. He is taking Atorvastatin. Pt reports that he told his cardiologist about this before and he was taken off his Atorvastatin and it did not help, he still had the muscle pain. So he went back on his medication. He reports that this was about a year ago. Takes Ibuprofen about once a week.     Prior to Admission medications   Medication Sig Start Date End Date Taking? Authorizing Provider  aspirin 81 MG tablet Take 81 mg by mouth daily.    Historical Provider, MD  atorvastatin (LIPITOR) 80 MG tablet Take 1 tablet (80 mg total) by mouth daily. 08/16/15 08/15/16  Jolaine Artist, MD  losartan (COZAAR) 100 MG tablet Take 0.5 tablets (50 mg total) by mouth daily. 11/04/14   Jolaine Artist, MD  nitroGLYCERIN (NITROSTAT) 0.4 MG SL tablet Place 1 tablet (0.4 mg total) under the tongue every 5 (five) minutes as needed. May repeat for up to 3 doses. 08/15/14   Jolaine Artist, MD  omeprazole (PRILOSEC) 40 MG capsule Take 40 mg by mouth daily.    Historical Provider, MD  sildenafil (REVATIO) 20 MG tablet Take 2-5 tablets (40-100 mg total) by mouth as needed. 08/04/14   Jolaine Artist, MD    Patient Active Problem List   Diagnosis Date Noted  . Basal cell carcinoma of skin 10/16/2015  .  Benign prostatic hyperplasia with urinary obstruction 10/16/2015  . Neuropathy ([place]) 10/16/2015  . Chest tightness 01/20/2015  . Posthitis 01/10/2015  . PAC (premature atrial contraction) 05/04/2014  . Atrial complex, premature 05/04/2014  . APC (atrial premature contractions) 05/04/2014  . Blood in semen 08/31/2012  . Murmur, cardiac 09/03/2011  . Cardiac murmur 09/03/2011  . 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 Postsurgical aortocoronary bypass status  01/02/2010  . 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 DISTURBANCE OF SKIN SENSATION 12/21/2009  . Carotid stenosis 08/22/2009  . Carotid artery obstruction 08/22/2009  . BACK PAIN 03/24/2009  . Cerebrovascular disease 11/17/2008  . Atherosclerosis of coronary artery 11/08/2008  . Hypercholesteremia 11/08/2008  . COLD SORE 11/12/2007  . Herpes 11/12/2007  . HYPERLIPIDEMIA 05/11/2007  . ERECTILE DYSFUNCTION 05/11/2007  . Essential hypertension 05/11/2007  . Coronary artery disease 05/11/2007  . GERD 05/11/2007  . SLEEP APNEA 05/11/2007  . COLONIC POLYPS, HX OF 05/11/2007  . Gastro-esophageal reflux disease without esophagitis 05/11/2007  . History of colon polyps 05/11/2007    Past Medical History  Diagnosis Date  . History of colonic polyps   . CAD (coronary artery disease)     s/p CABG 1998; Last cath 2002 in setting of marked ST elevation during stress testing. patent grafts; Echo 8/09 EF 55%. No significant valvular disease.  . Carotid artery disease ([place])     u/s 9/10: R 40-59% L 0-39%  . GERD (gastroesophageal reflux disease)   . Hyperlipidemia   . Hypertension   . Erectile dysfunction   . Sleep apnea     Social History   Social History  . Marital Status: Married    Spouse Name: N/A  . Number of Children: N/A  . Years of Education: N/A   Occupational History  . 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 Smoking status: Never Smoker   . Smokeless tobacco: Not on file  . Alcohol Use: No  . Drug Use: No  . Sexual Activity: Not on file   Other Topics Concern  . Not on file   Social History Narrative   Married with 3 children    No Known Allergies  Review of Systems  Constitutional: Negative.   HENT: Negative.   Eyes: Negative.   Respiratory: Negative.   Cardiovascular: Negative.   Gastrointestinal: Negative.   Genitourinary: Negative.   Musculoskeletal: Positive for myalgias and back pain.  Skin: Negative.   Neurological:  Negative.   Endo/Heme/Allergies: Negative.   Psychiatric/Behavioral: Negative.     Immunization History  Administered Date(s) Administered  . Influenza Whole 10/02/2005  . Influenza, High Dose Seasonal PF 09/13/2015  . Td 10/02/2002   Objective:  There were no vitals taken for this visit.  Physical Exam  Constitutional: He is oriented to person, place, and time and well-developed, well-nourished, and in no distress.  Eyes: Conjunctivae and EOM are normal. Pupils are equal, round, and reactive to light.  Neck: Normal range of motion. Neck supple.  Cardiovascular: Normal rate, regular rhythm, normal heart sounds and intact distal pulses.   Pulmonary/Chest: Effort normal and breath sounds normal.  Abdominal: Soft. Bowel sounds are normal.  Musculoskeletal: Normal range of motion.  Neurological: He is alert and oriented to person, place, and time. He has normal reflexes. Gait normal. GCS score is 15.  Grossly nonfocal.  Skin: Skin is warm and dry.  Psychiatric: Mood, memory, affect and judgment normal.    Lab Results  Component Value Date   WBC 5.7  02/12/2012   HGB 13.7 02/12/2012   HCT 38.9 02/12/2012   GLUCOSE 96 02/12/2012   CHOL 93* 02/12/2012   TRIG 85 02/12/2012   HDL 39* 02/12/2012   LDLCALC 37 02/12/2012   PSA 1.49 11/13/2007   HGBA1C 4.2* 02/12/2012    CMP     Component Value Date/Time   NA 138 02/12/2012 0835   NA 142 07/27/2008 2249   K 4.1 02/12/2012 0835   CL 104 02/12/2012 0835   CO2 20 02/12/2012 0835   GLUCOSE 96 02/12/2012 0835   GLUCOSE 95 07/27/2008 2249   BUN 10 02/12/2012 0835   BUN 13 07/27/2008 2249   CREATININE 0.86 02/12/2012 0835   CALCIUM 8.9 02/12/2012 0835   PROT 6.9 02/12/2012 0835   PROT 7.2 10/16/2010 2245   ALBUMIN 4.0 02/12/2012 0835   ALBUMIN 4.2 10/16/2010 2245   AST 24 02/12/2012 0835   ALT 19 02/12/2012 0835   ALKPHOS 138 02/12/2012 0835   BILITOT 1.4* 02/12/2012 0835   GFRNONAA 88 02/12/2012 0835   GFRAA 102  02/12/2012 0835    Assessment and Plan :  1. Essential hypertension Stable. Continue current medications.  - CBC with Differential/Platelet - TSH - Comprehensive metabolic panel  2. Gastro-esophageal reflux disease without esophagitis Controlled on Omeprazole daily. Continue.  - Comprehensive metabolic panel  3. Hypercholesteremia  - Lipid Panel With LDL/HDL Ratio - Comprehensive metabolic panel  4. Low back pain, unspecified back pain laterality, with sciatica presence unspecified Pt takes Ibuprofen about once weekly for symptoms. MSK pain most likely,advised to limit use of NSAID and alternate with tylenol.  Patient was seen and examined by Dr. Miguel Aschoff, and noted scribed by Webb Laws, [place] MD [facility] 10/16/2015 8:31 AM

## 2015-10-30 ENCOUNTER — Other Ambulatory Visit (HOSPITAL_COMMUNITY): Payer: Self-pay | Admitting: *Deleted

## 2015-10-30 DIAGNOSIS — E7800 Pure hypercholesterolemia, unspecified: Secondary | ICD-10-CM | POA: Diagnosis not present

## 2015-10-30 DIAGNOSIS — K219 Gastro-esophageal reflux disease without esophagitis: Secondary | ICD-10-CM | POA: Diagnosis not present

## 2015-10-30 DIAGNOSIS — I10 Essential (primary) hypertension: Secondary | ICD-10-CM | POA: Diagnosis not present

## 2015-10-31 LAB — CBC WITH DIFFERENTIAL/PLATELET
BASOS: 0 %
Basophils Absolute: 0 10*3/uL (ref 0.0–0.2)
EOS (ABSOLUTE): 0.1 10*3/uL (ref 0.0–0.4)
Eos: 3 %
HEMATOCRIT: 41.2 % (ref 37.5–51.0)
Hemoglobin: 13.3 g/dL (ref 12.6–17.7)
IMMATURE GRANULOCYTES: 0 %
Immature Grans (Abs): 0 10*3/uL (ref 0.0–0.1)
LYMPHS ABS: 1.9 10*3/uL (ref 0.7–3.1)
Lymphs: 39 %
MCH: 30.2 pg (ref 26.6–33.0)
MCHC: 32.3 g/dL (ref 31.5–35.7)
MCV: 94 fL (ref 79–97)
MONOS ABS: 0.5 10*3/uL (ref 0.1–0.9)
Monocytes: 9 %
NEUTROS ABS: 2.4 10*3/uL (ref 1.4–7.0)
NEUTROS PCT: 49 %
Platelets: 252 10*3/uL (ref 150–379)
RBC: 4.4 x10E6/uL (ref 4.14–5.80)
RDW: 13.6 % (ref 12.3–15.4)
WBC: 5 10*3/uL (ref 3.4–10.8)

## 2015-10-31 LAB — COMPREHENSIVE METABOLIC PANEL
A/G RATIO: 1.5 (ref 1.1–2.5)
ALK PHOS: 106 IU/L (ref 39–117)
ALT: 13 IU/L (ref 0–44)
AST: 13 IU/L (ref 0–40)
Albumin: 4 g/dL (ref 3.5–4.8)
BILIRUBIN TOTAL: 1 mg/dL (ref 0.0–1.2)
BUN/Creatinine Ratio: 11 (ref 10–22)
BUN: 12 mg/dL (ref 8–27)
CHLORIDE: 105 mmol/L (ref 97–106)
CO2: 26 mmol/L (ref 18–29)
Calcium: 9.3 mg/dL (ref 8.6–10.2)
Creatinine, Ser: 1.06 mg/dL (ref 0.76–1.27)
GFR calc Af Amer: 80 mL/min/{1.73_m2} (ref >59)
GFR calc non Af Amer: 69 mL/min/{1.73_m2} (ref >59)
GLOBULIN, TOTAL: 2.7 g/dL (ref 1.5–4.5)
Glucose: 94 mg/dL (ref 65–99)
POTASSIUM: 5.3 mmol/L — AB (ref 3.5–5.2)
SODIUM: 143 mmol/L (ref 136–144)
Total Protein: 6.7 g/dL (ref 6.0–8.5)

## 2015-10-31 LAB — LIPID PANEL WITH LDL/HDL RATIO
Cholesterol, Total: 110 mg/dL (ref 100–199)
HDL: 38 mg/dL — AB (ref >39)
LDL CALC: 56 mg/dL (ref 0–99)
LDL/HDL RATIO: 1.5 ratio (ref 0.0–3.6)
TRIGLYCERIDES: 79 mg/dL (ref 0–149)
VLDL Cholesterol Cal: 16 mg/dL (ref 5–40)

## 2015-10-31 LAB — TSH: TSH: 1.26 u[IU]/mL (ref 0.450–4.500)

## 2015-11-01 ENCOUNTER — Telehealth: Payer: Self-pay

## 2015-11-01 NOTE — Telephone Encounter (Signed)
LMTCB 11/01/2015  Thanks,   -Noe Pittsley  

## 2015-11-01 NOTE — Telephone Encounter (Signed)
Pt advised as directed below.   Thanks,   -Kara Mierzejewski  

## 2015-11-01 NOTE — Telephone Encounter (Signed)
-----   Message from Jerrol Banana., MD sent at 11/01/2015 10:18 AM EST ----- Labs ok.

## 2015-11-02 ENCOUNTER — Other Ambulatory Visit (HOSPITAL_COMMUNITY): Payer: Self-pay | Admitting: Internal Medicine

## 2015-11-10 ENCOUNTER — Encounter: Payer: Self-pay | Admitting: *Deleted

## 2015-11-13 ENCOUNTER — Ambulatory Visit: Payer: Medicare Other | Admitting: Anesthesiology

## 2015-11-13 ENCOUNTER — Encounter
Admission: RE | Disposition: A | Discharge status: Home / Self Care | Payer: Self-pay | Source: Ambulatory Visit | Attending: Unknown Physician Specialty

## 2015-11-13 ENCOUNTER — Telehealth (HOSPITAL_COMMUNITY): Payer: Self-pay | Admitting: Cardiology

## 2015-11-13 ENCOUNTER — Ambulatory Visit
Admission: RE | Admit: 2015-11-13 | Discharge: 2015-11-13 | Disposition: A | Discharge status: Home / Self Care | Payer: Medicare Other | Source: Ambulatory Visit | Attending: Unknown Physician Specialty | Admitting: Unknown Physician Specialty

## 2015-11-13 ENCOUNTER — Encounter: Payer: Self-pay | Admitting: *Deleted

## 2015-11-13 DIAGNOSIS — G4730 Sleep apnea, unspecified: Secondary | ICD-10-CM | POA: Insufficient documentation

## 2015-11-13 DIAGNOSIS — Z803 Family history of malignant neoplasm of breast: Secondary | ICD-10-CM | POA: Insufficient documentation

## 2015-11-13 DIAGNOSIS — K635 Polyp of colon: Secondary | ICD-10-CM | POA: Diagnosis not present

## 2015-11-13 DIAGNOSIS — K640 First degree hemorrhoids: Secondary | ICD-10-CM | POA: Diagnosis not present

## 2015-11-13 DIAGNOSIS — I2510 Atherosclerotic heart disease of native coronary artery without angina pectoris: Secondary | ICD-10-CM | POA: Diagnosis not present

## 2015-11-13 DIAGNOSIS — Z86010 Personal history of colonic polyps: Secondary | ICD-10-CM | POA: Insufficient documentation

## 2015-11-13 DIAGNOSIS — Z7982 Long term (current) use of aspirin: Secondary | ICD-10-CM | POA: Diagnosis not present

## 2015-11-13 DIAGNOSIS — K219 Gastro-esophageal reflux disease without esophagitis: Secondary | ICD-10-CM | POA: Diagnosis not present

## 2015-11-13 DIAGNOSIS — I4891 Unspecified atrial fibrillation: Secondary | ICD-10-CM | POA: Insufficient documentation

## 2015-11-13 DIAGNOSIS — K648 Other hemorrhoids: Secondary | ICD-10-CM | POA: Diagnosis not present

## 2015-11-13 DIAGNOSIS — E785 Hyperlipidemia, unspecified: Secondary | ICD-10-CM | POA: Insufficient documentation

## 2015-11-13 DIAGNOSIS — Z951 Presence of aortocoronary bypass graft: Secondary | ICD-10-CM | POA: Diagnosis not present

## 2015-11-13 DIAGNOSIS — I25810 Atherosclerosis of coronary artery bypass graft(s) without angina pectoris: Secondary | ICD-10-CM | POA: Diagnosis not present

## 2015-11-13 DIAGNOSIS — Z8249 Family history of ischemic heart disease and other diseases of the circulatory system: Secondary | ICD-10-CM | POA: Diagnosis not present

## 2015-11-13 DIAGNOSIS — Z823 Family history of stroke: Secondary | ICD-10-CM | POA: Diagnosis not present

## 2015-11-13 DIAGNOSIS — Z79899 Other long term (current) drug therapy: Secondary | ICD-10-CM | POA: Insufficient documentation

## 2015-11-13 DIAGNOSIS — I10 Essential (primary) hypertension: Secondary | ICD-10-CM | POA: Diagnosis not present

## 2015-11-13 HISTORY — PX: COLONOSCOPY WITH PROPOFOL: SHX5780

## 2015-11-13 SURGERY — COLONOSCOPY WITH PROPOFOL
Anesthesia: General

## 2015-11-13 MED ORDER — EPHEDRINE SULFATE 50 MG/ML IJ SOLN
INTRAMUSCULAR | Status: DC | PRN
  Administered 2015-11-13: 5 mg via INTRAVENOUS
  Administered 2015-11-13: 10 mg via INTRAVENOUS

## 2015-11-13 MED ORDER — LIDOCAINE HCL (PF) 2 % IJ SOLN
INTRAMUSCULAR | Status: DC | PRN
  Administered 2015-11-13: 50 mg

## 2015-11-13 MED ORDER — FENTANYL CITRATE (PF) 100 MCG/2ML IJ SOLN
INTRAMUSCULAR | Status: DC | PRN
  Administered 2015-11-13: 50 ug via INTRAVENOUS

## 2015-11-13 MED ORDER — SODIUM CHLORIDE 0.9 % IV SOLN
INTRAVENOUS | Status: DC
  Administered 2015-11-13: 15:00:00 via INTRAVENOUS

## 2015-11-13 MED ORDER — MIDAZOLAM HCL 5 MG/5ML IJ SOLN
INTRAMUSCULAR | Status: DC | PRN
  Administered 2015-11-13: 1 mg via INTRAVENOUS

## 2015-11-13 MED ORDER — PROPOFOL 500 MG/50ML IV EMUL
INTRAVENOUS | Status: DC | PRN
  Administered 2015-11-13: 100 ug/kg/min via INTRAVENOUS

## 2015-11-13 MED ORDER — SODIUM CHLORIDE 0.9 % IV SOLN: INTRAVENOUS | Status: DC

## 2015-11-13 MED ORDER — PROPOFOL 10 MG/ML IV BOLUS
INTRAVENOUS | Status: DC | PRN
  Administered 2015-11-13: 30 mg via INTRAVENOUS

## 2015-11-13 NOTE — Anesthesia Postprocedure Evaluation (Signed)
Anesthesia Post Note  Patient: Micheal Wright  Procedure(s) Performed: Procedure(s) (LRB): COLONOSCOPY WITH PROPOFOL (N/A)  Patient location during evaluation: Endoscopy Anesthesia Type: General Level of consciousness: awake and alert Pain management: pain level controlled Vital Signs Assessment: post-procedure vital signs reviewed and stable Respiratory status: spontaneous breathing, nonlabored ventilation, respiratory function stable and patient connected to nasal cannula oxygen Cardiovascular status: blood pressure returned to baseline and stable Postop Assessment: no signs of nausea or vomiting Anesthetic complications: no    Last Vitals:  Filed Vitals:   11/13/15 1617 11/13/15 1627  BP: 117/87 132/71  Pulse: 65 75  Temp:    Resp: 17 23    Last Pain: There were no vitals filed for this visit.               Precious Haws Janashia Parco

## 2015-11-13 NOTE — Transfer of Care (Signed)
Immediate Anesthesia Transfer of Care Note  Patient: Micheal Wright  Procedure(s) Performed: Procedure(s): COLONOSCOPY WITH PROPOFOL (N/A)  Patient Location: PACU  Anesthesia Type:General  Level of Consciousness: sedated  Airway & Oxygen Therapy: Patient Spontanous Breathing and Patient connected to nasal cannula oxygen  Post-op Assessment: Report given to RN and Post -op Vital signs reviewed and stable  Post vital signs: Reviewed and stable  Last Vitals:  Filed Vitals:   11/13/15 1419  BP: 125/73  Pulse: 72  Temp: 37 C  Resp: 20    Complications: No apparent anesthesia complications

## 2015-11-13 NOTE — Telephone Encounter (Signed)
Pt went into afib during colonoscopy on 11/13/15 Per vo Dr. Haroldine Laws Add to clinic schedule for 11/14/15 for further evaluation, if patient still in a fib will need DCCV   Pt added to schedule a@ 10 am and pt aware

## 2015-11-13 NOTE — Anesthesia Preprocedure Evaluation (Signed)
Anesthesia Evaluation  Patient identified by MRN, date of birth, ID band Patient awake    Reviewed: Allergy & Precautions, H&P , NPO status , Patient's Chart, lab work & pertinent test results  History of Anesthesia Complications Negative for: history of anesthetic complications  Airway Mallampati: III  TM Distance: >3 FB Neck ROM: full    Dental  (+) Poor Dentition, Missing, Upper Dentures   Pulmonary neg shortness of breath, sleep apnea ,    Pulmonary exam normal breath sounds clear to auscultation       Cardiovascular Exercise Tolerance: Good hypertension, + CAD, + Past MI, + CABG and + Peripheral Vascular Disease  Normal cardiovascular exam+ Valvular Problems/Murmurs  Rhythm:regular Rate:Normal     Neuro/Psych  Neuromuscular disease negative psych ROS   GI/Hepatic Neg liver ROS, GERD  Controlled,  Endo/Other  negative endocrine ROS  Renal/GU negative Renal ROS  negative genitourinary   Musculoskeletal   Abdominal   Peds  Hematology negative hematology ROS (+)   Anesthesia Other Findings Past Medical History:   History of colonic polyps                                    CAD (coronary artery disease)                                  Comment:s/p CABG 1998; Last cath 2002 in setting of               marked ST elevation during stress testing.               patent grafts; Echo 8/09 EF 55%. No significant              valvular disease.   Carotid artery disease ([place])                                   Comment:u/s 9/10: R 40-59% L 0-39%   GERD (gastroesophageal reflux disease)                       Hyperlipidemia                                               Hypertension                                                 Erectile dysfunction                                         Sleep apnea                                                 Past Surgical History:   APPENDECTOMY  [place]         CARDIAC CATHETERIZATION                          2002         KNEE ARTHROSCOPY W/ PARTIAL MEDIAL MENISCECTOMY  1990s          Comment:Left knee   ESOPHAGOGASTRODUODENOSCOPY                       04/2004        Comment:Inflam. at Z line (biopsy neg)   COLONOSCOPY W/ POLYPECTOMY                                   BMI    Body Mass Index   28.69 kg/m 2      Reproductive/Obstetrics negative OB ROS                             Anesthesia Physical Anesthesia Plan  ASA: III  Anesthesia Plan: General   Post-op Pain Management:    Induction:   Airway Management Planned:   Additional Equipment:   Intra-op Plan:   Post-operative Plan:   Informed Consent: I have reviewed the patients History and Physical, chart, labs and discussed the procedure including the risks, benefits and alternatives for the proposed anesthesia with the patient or authorized representative who has indicated his/her understanding and acceptance.   Dental Advisory Given  Plan Discussed with: Anesthesiologist, CRNA and Surgeon  Anesthesia Plan Comments:         Anesthesia Quick Evaluation

## 2015-11-13 NOTE — H&P (Signed)
Primary Care Physician:  [name], MD Primary Gastroenterologist:  Dr. Vira Agar  Pre-Procedure History & Physical: HPI:  Micheal Wright is a 74 y.o. male is here for an colonoscopy.   Past Medical History  Diagnosis Date  . History of colonic polyps   . CAD (coronary artery disease)     s/p CABG 1998; Last cath 2002 in setting of marked ST elevation during stress testing. patent grafts; Echo 8/09 EF 55%. No significant valvular disease.  . Carotid artery disease ([place])     u/s 9/10: R 40-59% L 0-39%  . GERD (gastroesophageal reflux disease)   . Hyperlipidemia   . Hypertension   . Erectile dysfunction   . Sleep apnea     Past Surgical History  Procedure Laterality Date  . Appendectomy  1960's  . Coronary artery bypass graft  1998  . Cardiac catheterization  2002  . Knee arthroscopy w/ partial medial meniscectomy  1990s    Left knee  . Esophagogastroduodenoscopy  04/2004    Inflam. at Z line (biopsy neg)  . Colonoscopy w/ polypectomy      Prior to Admission medications   Medication Sig Start Date End Date Taking? Authorizing Provider  aspirin 81 MG tablet Take 81 mg by mouth daily.   Yes Historical Provider, MD  atorvastatin (LIPITOR) 80 MG tablet Take 1 tablet (80 mg total) by mouth daily. 08/16/15 08/15/16 Yes Shaune Pascal Bensimhon, MD  losartan (COZAAR) 100 MG tablet Take 0.5 tablets (50 mg total) by mouth daily. Patient taking differently: Take 100 mg by mouth daily.  11/04/14  Yes Jolaine Artist, MD  nitroGLYCERIN (NITROSTAT) 0.4 MG SL tablet Place 1 tablet (0.4 mg total) under the tongue every 5 (five) minutes as needed. May repeat for up to 3 doses. 08/15/14  Yes Jolaine Artist, MD  omeprazole (PRILOSEC) 40 MG capsule Take 40 mg by mouth daily.   Yes Historical Provider, MD  sildenafil (REVATIO) 20 MG tablet TAKE 2 TO 5 TABLETS BY MOUTH DAILY AS NEEDED 11/02/15  Yes Jolaine Artist, MD  sildenafil (VIAGRA) 100 MG tablet Take by mouth. 12/31/10  Yes Historical  Provider, MD    Allergies as of 10/23/2015  . (No Known Allergies)    Family History  Problem Relation Age of Onset  . Stroke Mother   . Heart attack Father   . Breast cancer Sister   . Rheumatologic disease Brother     Heart  . Heart disease Brother   . Hypertension Neg Hx   . Diabetes Neg Hx   . Prostate cancer Neg Hx   . Colon cancer Neg Hx     Social History   Social History  . Marital Status: Married    Spouse Name: N/A  . Number of Children: N/A  . Years of Education: N/A   Occupational History  . 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 Smoking status: Never Smoker   . Smokeless tobacco: Not on file  . Alcohol Use: No  . Drug Use: No  . Sexual Activity: Not on file   Other Topics Concern  . Not on file   Social History Narrative   Married with 3 children    Review of Systems: See HPI, otherwise negative ROS  Physical Exam: BP 125/73 mmHg  Pulse 72  Temp(Src) 98.6 F (37 C)  Resp 20  Ht 5\' 10"  (1.778 m)  Wt 90.719 kg (200 lb)  BMI 28.70  kg/m2  SpO2 98% General:   Alert,  pleasant and cooperative in NAD Head:  Normocephalic and atraumatic. Neck:  Supple; no masses or thyromegaly. Lungs:  Clear throughout to auscultation.    Heart:  Regular rate and rhythm. Abdomen:  Soft, nontender and nondistended. Normal bowel sounds, without guarding, and without rebound.   Neurologic:  Alert and  oriented x4;  grossly normal neurologically.  Impression/Plan: Micheal Wright is here for an colonoscopy to be performed for [facility] colon polyps  Risks, benefits, limitations, and alternatives regarding  colonoscopy have been reviewed with the patient.  Questions have been answered.  All parties agreeable.   Gaylyn Cheers, MD  11/13/2015, 3:09 PM

## 2015-11-13 NOTE — Op Note (Signed)
[facility] Gastroenterology Patient Name: Micheal Wright Procedure Date: 11/13/2015 3:16 PM MRN: [id number] Account #: [id number] Date of Birth: [birth year] Admit Type: Outpatient Age: 74 Room: [facility] ENDO ROOM 1 Gender: Male Note Status: Finalized Procedure:         Colonoscopy Indications:       High risk colon cancer surveillance: Personal history of                     colonic polyps Providers:         Manya Silvas, MD Referring MD:      Janine Ores. Rosanna Randy, MD (Referring MD) Medicines:         Propofol per Anesthesia Complications:     No immediate complications. Procedure:         Pre-Anesthesia Assessment:                    - After reviewing the risks and benefits, the patient was                     deemed in satisfactory condition to undergo the procedure.                    After obtaining informed consent, the colonoscope was                     passed under direct vision. Throughout the procedure, the                     patient's blood pressure, pulse, and oxygen saturations                     were monitored continuously. The Colonoscope was                     introduced through the anus and advanced to the the cecum,                     identified by appendiceal orifice and ileocecal valve. The                     colonoscopy was somewhat difficult due to significant                     looping. Successful completion of the procedure was aided                     by applying abdominal pressure. Findings:      Internal hemorrhoids were found during endoscopy. The hemorrhoids were       small and Grade I (internal hemorrhoids that do not prolapse).      The exam was otherwise without abnormality. Prep was fair at best       requiring 3 liters of lavage to examine the colon lining.      Previous tattoo was seen and no polyp tissue nearby. Impression:        - Internal hemorrhoids.                    - The examination was otherwise normal.          - No specimens collected. Recommendation:    - The findings and recommendations were discussed with the  patient's family. The patient went into atrial fib during                     the procedure and I will get an EKG and call his                     cardiologist,. Manya Silvas, MD 11/13/2015 3:58:07 PM This report has been signed electronically. Number of Addenda: 0 Note Initiated On: 11/13/2015 3:16 PM Scope Withdrawal Time: 0 hours 13 minutes 53 seconds  Total Procedure Duration: 0 hours 30 minutes 48 seconds       [facility]

## 2015-11-14 ENCOUNTER — Encounter (HOSPITAL_COMMUNITY)
Admission: RE | Disposition: A | Discharge status: Home / Self Care | Payer: Self-pay | Source: Ambulatory Visit | Attending: Internal Medicine

## 2015-11-14 ENCOUNTER — Ambulatory Visit (HOSPITAL_COMMUNITY)
Admission: RE | Admit: 2015-11-14 | Discharge: 2015-11-14 | Disposition: A | Discharge status: Home / Self Care | Payer: Medicare Other | Source: Ambulatory Visit | Attending: Internal Medicine | Admitting: Internal Medicine

## 2015-11-14 ENCOUNTER — Ambulatory Visit (HOSPITAL_COMMUNITY): Payer: Medicare Other | Admitting: Anesthesiology

## 2015-11-14 ENCOUNTER — Encounter (HOSPITAL_COMMUNITY): Payer: Self-pay

## 2015-11-14 ENCOUNTER — Ambulatory Visit (HOSPITAL_BASED_OUTPATIENT_CLINIC_OR_DEPARTMENT_OTHER)
Admit: 2015-11-14 | Discharge: 2015-11-14 | Disposition: A | Discharge status: Home / Self Care | Payer: Medicare Other | Attending: Internal Medicine | Admitting: Internal Medicine

## 2015-11-14 ENCOUNTER — Other Ambulatory Visit: Payer: Self-pay

## 2015-11-14 ENCOUNTER — Encounter (HOSPITAL_COMMUNITY): Payer: Self-pay | Admitting: Internal Medicine

## 2015-11-14 VITALS — BP 118/72 | HR 87 | Wt 200.5 lb

## 2015-11-14 DIAGNOSIS — I2510 Atherosclerotic heart disease of native coronary artery without angina pectoris: Secondary | ICD-10-CM | POA: Diagnosis not present

## 2015-11-14 DIAGNOSIS — Z79899 Other long term (current) drug therapy: Secondary | ICD-10-CM | POA: Insufficient documentation

## 2015-11-14 DIAGNOSIS — E785 Hyperlipidemia, unspecified: Secondary | ICD-10-CM | POA: Insufficient documentation

## 2015-11-14 DIAGNOSIS — I6529 Occlusion and stenosis of unspecified carotid artery: Secondary | ICD-10-CM | POA: Diagnosis not present

## 2015-11-14 DIAGNOSIS — K219 Gastro-esophageal reflux disease without esophagitis: Secondary | ICD-10-CM | POA: Insufficient documentation

## 2015-11-14 DIAGNOSIS — Z951 Presence of aortocoronary bypass graft: Secondary | ICD-10-CM | POA: Insufficient documentation

## 2015-11-14 DIAGNOSIS — I252 Old myocardial infarction: Secondary | ICD-10-CM | POA: Insufficient documentation

## 2015-11-14 DIAGNOSIS — I480 Paroxysmal atrial fibrillation: Secondary | ICD-10-CM

## 2015-11-14 DIAGNOSIS — I4891 Unspecified atrial fibrillation: Secondary | ICD-10-CM | POA: Diagnosis not present

## 2015-11-14 DIAGNOSIS — I739 Peripheral vascular disease, unspecified: Secondary | ICD-10-CM | POA: Insufficient documentation

## 2015-11-14 DIAGNOSIS — I10 Essential (primary) hypertension: Secondary | ICD-10-CM | POA: Diagnosis not present

## 2015-11-14 DIAGNOSIS — Z7901 Long term (current) use of anticoagulants: Secondary | ICD-10-CM | POA: Insufficient documentation

## 2015-11-14 DIAGNOSIS — G4730 Sleep apnea, unspecified: Secondary | ICD-10-CM | POA: Diagnosis not present

## 2015-11-14 DIAGNOSIS — Z7982 Long term (current) use of aspirin: Secondary | ICD-10-CM | POA: Diagnosis not present

## 2015-11-14 DIAGNOSIS — G629 Polyneuropathy, unspecified: Secondary | ICD-10-CM | POA: Insufficient documentation

## 2015-11-14 DIAGNOSIS — I6523 Occlusion and stenosis of bilateral carotid arteries: Secondary | ICD-10-CM | POA: Diagnosis not present

## 2015-11-14 HISTORY — PX: CARDIOVERSION: SHX1299

## 2015-11-14 LAB — CBC
HCT: 41.4 % (ref 39.0–52.0)
HEMOGLOBIN: 13.6 g/dL (ref 13.0–17.0)
MCH: 30.9 pg (ref 26.0–34.0)
MCHC: 32.9 g/dL (ref 30.0–36.0)
MCV: 94.1 fL (ref 78.0–100.0)
PLATELETS: 225 10*3/uL (ref 150–400)
RBC: 4.4 MIL/uL (ref 4.22–5.81)
RDW: 13 % (ref 11.5–15.5)
WBC: 4.9 10*3/uL (ref 4.0–10.5)

## 2015-11-14 LAB — BASIC METABOLIC PANEL
ANION GAP: 6 (ref 5–15)
BUN: 7 mg/dL (ref 6–20)
CALCIUM: 8.9 mg/dL (ref 8.9–10.3)
CO2: 25 mmol/L (ref 22–32)
Chloride: 109 mmol/L (ref 101–111)
Creatinine, Ser: 1 mg/dL (ref 0.61–1.24)
Glucose, Bld: 105 mg/dL — ABNORMAL HIGH (ref 65–99)
Potassium: 4.8 mmol/L (ref 3.5–5.1)
SODIUM: 140 mmol/L (ref 135–145)

## 2015-11-14 LAB — PROTIME-INR
INR: 1.31 (ref 0.00–1.49)
PROTHROMBIN TIME: 16.4 s — AB (ref 11.6–15.2)

## 2015-11-14 SURGERY — CARDIOVERSION
Anesthesia: General

## 2015-11-14 MED ORDER — LIDOCAINE HCL (CARDIAC) 20 MG/ML IV SOLN
INTRAVENOUS | Status: DC | PRN
  Administered 2015-11-14: 60 mg via INTRAVENOUS

## 2015-11-14 MED ORDER — SODIUM CHLORIDE 0.9 % IV SOLN
INTRAVENOUS | Status: DC | PRN
  Administered 2015-11-14: 13:00:00 via INTRAVENOUS

## 2015-11-14 MED ORDER — PROPOFOL 10 MG/ML IV BOLUS
INTRAVENOUS | Status: DC | PRN
  Administered 2015-11-14: 70 mg via INTRAVENOUS
  Administered 2015-11-14: 20 mg via INTRAVENOUS

## 2015-11-14 NOTE — Addendum Note (Signed)
Encounter addended by: Scarlette Calico, RN on: 11/14/2015 11:27 AM<BR>     Documentation filed: Dx [employer], Patient Instructions Section, Orders

## 2015-11-14 NOTE — Interval H&P Note (Signed)
History and Physical Interval Note:  11/14/2015 1:56 PM  Micheal Wright  has presented today for surgery, with the diagnosis of afib  The various methods of treatment have been discussed with the patient and family. After consideration of risks, benefits and other options for treatment, the patient has consented to  Procedure(s): CARDIOVERSION (N/A) as a surgical intervention .  The patient's history has been reviewed, patient examined, no change in status, stable for surgery.  I have reviewed the patient's chart and labs.  Questions were answered to the patient's satisfaction.     Enrica Corliss, Quillian Quince

## 2015-11-14 NOTE — Patient Instructions (Signed)
Cardioversion scheduled for today, please go to [facility] inside Main Entrance A  We will contact you in 3 months to schedule your next appointment.

## 2015-11-14 NOTE — Discharge Instructions (Signed)
Electrical Cardioversion, Care After °Refer to this sheet in the next few weeks. These instructions provide you with information on caring for yourself after your procedure. Your health care provider may also give you more specific instructions. Your treatment has been planned according to current medical practices, but problems sometimes occur. Call your health care provider if you have any problems or questions after your procedure. °WHAT TO EXPECT AFTER THE PROCEDURE °After your procedure, it is typical to have the following sensations: °· Some redness on the skin where the shocks were delivered. If this is tender, a sunburn lotion or hydrocortisone cream may help. °· Possible return of an abnormal heart rhythm within hours or days after the procedure. °HOME CARE INSTRUCTIONS °· Take medicines only as directed by your health care provider. Be sure you understand how and when to take your medicine. °· Learn how to feel your pulse and check it often. °· Limit your activity for 48 hours after the procedure or as directed by your health care provider. °· Avoid or minimize caffeine and other stimulants as directed by your health care provider. °SEEK MEDICAL CARE IF: °· You feel like your heart is beating too fast or your pulse is not regular. °· You have any questions about your medicines. °· You have bleeding that will not stop. °SEEK IMMEDIATE MEDICAL CARE IF: °· You are dizzy or feel faint. °· It is hard to breathe or you feel short of breath. °· There is a change in discomfort in your chest. °· Your speech is slurred or you have trouble moving an arm or leg on one side of your body. °· You get a serious muscle cramp that does not go away. °· Your fingers or toes turn cold or blue. °  °This information is not intended to replace advice given to you by your health care provider. Make sure you discuss any questions you have with your health care provider. °  °Document Released: 09/08/2013 Document Revised: 12/09/2014  Document Reviewed: 09/08/2013 °Elsevier Interactive Patient Education ©2016 [employer] ° °

## 2015-11-14 NOTE — Anesthesia Postprocedure Evaluation (Signed)
Anesthesia Post Note  Patient: Micheal Wright  Procedure(s) Performed: Procedure(s) (LRB): CARDIOVERSION (N/A)  Patient location during evaluation: PACU Anesthesia Type: General Level of consciousness: sedated and patient cooperative Pain management: pain level controlled Vital Signs Assessment: post-procedure vital signs reviewed and stable Respiratory status: spontaneous breathing Cardiovascular status: stable Anesthetic complications: no    Last Vitals:  Filed Vitals:   11/14/15 1411 11/14/15 1415  BP: 136/67 133/87  Pulse:  67  Resp:  19    Last Pain: There were no vitals filed for this visit.               Nolon Nations

## 2015-11-14 NOTE — Anesthesia Preprocedure Evaluation (Signed)
Anesthesia Evaluation  Patient identified by MRN, date of birth, ID band Patient awake    Reviewed: Allergy & Precautions, H&P , NPO status , Patient's Chart, lab work & pertinent test results  History of Anesthesia Complications Negative for: history of anesthetic complications  Airway Mallampati: III  TM Distance: >3 FB Neck ROM: full    Dental  (+) Poor Dentition, Missing, Upper Dentures   Pulmonary neg shortness of breath, sleep apnea ,    Pulmonary exam normal breath sounds clear to auscultation       Cardiovascular Exercise Tolerance: Good hypertension, Pt. on medications + CAD, + Past MI, + CABG and + Peripheral Vascular Disease  Normal cardiovascular exam+ Valvular Problems/Murmurs  Rhythm:regular Rate:Normal     Neuro/Psych  Neuromuscular disease negative psych ROS   GI/Hepatic Neg liver ROS, GERD  Controlled,  Endo/Other  negative endocrine ROS  Renal/GU negative Renal ROS     Musculoskeletal   Abdominal   Peds  Hematology negative hematology ROS (+)   Anesthesia Other Findings   Reproductive/Obstetrics negative OB ROS                             Anesthesia Physical  Anesthesia Plan  ASA: III  Anesthesia Plan: General   Post-op Pain Management:    Induction:   Airway Management Planned:   Additional Equipment:   Intra-op Plan:   Post-operative Plan:   Informed Consent: I have reviewed the patients History and Physical, chart, labs and discussed the procedure including the risks, benefits and alternatives for the proposed anesthesia with the patient or authorized representative who has indicated his/her understanding and acceptance.   Dental Advisory Given  Plan Discussed with: CRNA  Anesthesia Plan Comments:         Anesthesia Quick Evaluation

## 2015-11-14 NOTE — Transfer of Care (Signed)
Immediate Anesthesia Transfer of Care Note  Patient: Micheal Wright  Procedure(s) Performed: Procedure(s): CARDIOVERSION (N/A)  Patient Location: PACU  Anesthesia Type:MAC  Level of Consciousness: awake, alert , oriented and patient cooperative  Airway & Oxygen Therapy: Patient Spontanous Breathing and Patient connected to nasal cannula oxygen  Post-op Assessment: Report given to RN, Post -op Vital signs reviewed and stable and Patient moving all extremities  Post vital signs: Reviewed and stable  Last Vitals:  Filed Vitals:   11/14/15 1407 11/14/15 1411  BP: 168/78 136/67  Pulse:    Resp:      Complications: No apparent anesthesia complications

## 2015-11-14 NOTE — H&P (View-Only) (Signed)
Cardiology Clinic Note   PCP: Micheal Wright HPI:  Micheal Wright is a very pleasant 74 year old male with a history of coronary artery disease status post coronary artery bypass grafting in 1998.  Last catheterization was in January 2002 due to an abnormal stress test with 4 mm of ST elevation during exercise.  Cardiac catheterization showed patent grafts.  He has a history of hypertension, hyperlipidemia, and sleep apnea. Myoview 2/09 EF 61% No ischemia.   He returns today for add on visit. Went into afib yesterday during a colonoscopy. Completely asymptomatic. Remains active doing yard work. No CP or SOB. 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    Echo 01/2015 EF 60-65%, no AR, Mild MR/TR, PA peak pressure 33 mmHg, GLPSS -21% LA size 3.8cm Carotid US 01/2015 with minimal plaque bilaterally. Repeat 2 years.  Carotid u/s 11/2012  R okL 40-59% (stable).  ABI 12/2009  R 1.0  L 1.1  Lipids checked with Micheal. Micheal Wright  08/2013 Total Cholesterol: 121 TG 82 HDL 44 LDL 61   ROS: All systems negative except as listed in HPI, PMH and Problem List.  Past Medical History  Diagnosis Date  . History of colonic polyps   . CAD (coronary artery disease)     s/p CABG 1998; Last cath 2002 in setting of marked ST elevation during stress testing. patent grafts; Echo 8/09 EF 55%. No significant valvular disease.  . Carotid artery disease ([place])     u/s 9/10: R 40-59% L 0-39%  . GERD (gastroesophageal reflux disease)   . Hyperlipidemia   . Hypertension   . Erectile dysfunction   . Sleep apnea     Current Outpatient Prescriptions  Medication Sig Dispense Refill  . apixaban (ELIQUIS) 5 MG TABS tablet Take 5 mg by mouth 2 (two) times daily.    Marland Kitchen aspirin 81 MG tablet Take 81 mg by mouth daily.    Marland Kitchen atorvastatin (LIPITOR) 80 MG tablet Take 1 tablet (80 mg total) by mouth daily. 30 tablet 11  . omeprazole (PRILOSEC) 40 MG capsule Take 40 mg by mouth daily.    . sildenafil (REVATIO) 20 MG tablet TAKE 2 TO 5 TABLETS  BY MOUTH DAILY AS NEEDED 50 tablet 0  . nitroGLYCERIN (NITROSTAT) 0.4 MG SL tablet Place 1 tablet (0.4 mg total) under the tongue every 5 (five) minutes as needed. May repeat for up to 3 doses. (Patient not taking: Reported on 11/14/2015) 25 tablet 3   No current facility-administered medications for this encounter.   Filed Vitals:   11/14/15 0954  BP: 118/72  Pulse: 87  Weight: 200 lb 8 oz (90.946 kg)  SpO2: 99%    PHYSICAL EXAM: General:  Well appearing. No resp difficulty HEENT: normal Neck: supple. JVP flat. Carotids 2+ bilaterally; no bruits. No thyromegaly or nodule noted. Cor: PMI normal. Irregular rate & rhythm. No rubs, gallops, no murmur Crisp s2. Lungs: CTAB, normal effort Abdomen: soft, NT, ND, no HSM. No bruits or masses. +BS  Extremities: no cyanosis, clubbing, rash, edema Neuro: alert & orientedx3, cranial nerves grossly intact. Moves all 4 extremities w/o difficulty. Affect pleasant.  ECG:  Afib with slow VR, 54 bpm   ASSESSMENT & PLAN:  1) New onset a fib.  - First noted yesterday during colonoscopy. EKG today shows afib with slowVR - CHADS VASC 3. Will continue Eliquis. Plan DC-CV as it is < 48 hours out.  2) CAD, s/p CABG 1998 - Occasional exertional chest tightness. He is 18 years out from  CABG. Unable to walk fast due to neuropathy. Will get Myoview.  Continue statin and ASA.  3) HTN - Well controlled. Continue current regimen 4) HLD - managed by Micheal. Micheal Wright. HDL improved. 5) Bradycardia - Stopped Toprol. Doing well. 6) Carotid stenosis, mild -F/u every 2 years. Due now  Micheal Bickers, MD 10:06 AM

## 2015-11-14 NOTE — Progress Notes (Signed)
Cardiology Clinic Note   PCP: Dr Rosanna Randy HPI:  Micheal Wright is a very pleasant 74 year old male with a history of coronary artery disease status post coronary artery bypass grafting in 1998.  Last catheterization was in January 2002 due to an abnormal stress test with 4 mm of ST elevation during exercise.  Cardiac catheterization showed patent grafts.  He has a history of hypertension, hyperlipidemia, and sleep apnea. Myoview 2/09 EF 61% No ischemia.   He returns today for add on visit. Went into afib yesterday during a colonoscopy. Completely asymptomatic. Remains active doing yard work. No CP or SOB. 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    Echo 01/2015 EF 60-65%, no AR, Mild MR/TR, PA peak pressure 33 mmHg, GLPSS -21% LA size 3.8cm Carotid US 01/2015 with minimal plaque bilaterally. Repeat 2 years.  Carotid u/s 11/2012  R okL 40-59% (stable).  ABI 12/2009  R 1.0  L 1.1  Lipids checked with Dr. Rosanna Randy  08/2013 Total Cholesterol: 121 TG 82 HDL 44 LDL 61   ROS: All systems negative except as listed in HPI, PMH and Problem List.  Past Medical History  Diagnosis Date  . History of colonic polyps   . CAD (coronary artery disease)     s/p CABG 1998; Last cath 2002 in setting of marked ST elevation during stress testing. patent grafts; Echo 8/09 EF 55%. No significant valvular disease.  . Carotid artery disease ([place])     u/s 9/10: R 40-59% L 0-39%  . GERD (gastroesophageal reflux disease)   . Hyperlipidemia   . Hypertension   . Erectile dysfunction   . Sleep apnea     Current Outpatient Prescriptions  Medication Sig Dispense Refill  . apixaban (ELIQUIS) 5 MG TABS tablet Take 5 mg by mouth 2 (two) times daily.    Marland Kitchen aspirin 81 MG tablet Take 81 mg by mouth daily.    Marland Kitchen atorvastatin (LIPITOR) 80 MG tablet Take 1 tablet (80 mg total) by mouth daily. 30 tablet 11  . omeprazole (PRILOSEC) 40 MG capsule Take 40 mg by mouth daily.    . sildenafil (REVATIO) 20 MG tablet TAKE 2 TO 5 TABLETS  BY MOUTH DAILY AS NEEDED 50 tablet 0  . nitroGLYCERIN (NITROSTAT) 0.4 MG SL tablet Place 1 tablet (0.4 mg total) under the tongue every 5 (five) minutes as needed. May repeat for up to 3 doses. (Patient not taking: Reported on 11/14/2015) 25 tablet 3   No current facility-administered medications for this encounter.   Filed Vitals:   11/14/15 0954  BP: 118/72  Pulse: 87  Weight: 200 lb 8 oz (90.946 kg)  SpO2: 99%    PHYSICAL EXAM: General:  Well appearing. No resp difficulty HEENT: normal Neck: supple. JVP flat. Carotids 2+ bilaterally; no bruits. No thyromegaly or nodule noted. Cor: PMI normal. Irregular rate & rhythm. No rubs, gallops, no murmur Crisp s2. Lungs: CTAB, normal effort Abdomen: soft, NT, ND, no HSM. No bruits or masses. +BS  Extremities: no cyanosis, clubbing, rash, edema Neuro: alert & orientedx3, cranial nerves grossly intact. Moves all 4 extremities w/o difficulty. Affect pleasant.  ECG:  Afib with slow VR, 54 bpm   ASSESSMENT & PLAN:  1) New onset a fib.  - First noted yesterday during colonoscopy. EKG today shows afib with slowVR - CHADS VASC 3. Will continue Eliquis. Plan DC-CV as it is < 48 hours out.  2) CAD, s/p CABG 1998 - Occasional exertional chest tightness. He is 18 years out from  CABG. Unable to walk fast due to neuropathy. Will get Myoview.  Continue statin and ASA.  3) HTN - Well controlled. Continue current regimen 4) HLD - managed by Dr. Rosanna Randy. HDL improved. 5) Bradycardia - Stopped Toprol. Doing well. 6) Carotid stenosis, mild -F/u every 2 years. Due now  [name], MD 10:06 AM

## 2015-11-14 NOTE — CV Procedure (Signed)
     DIRECT CURRENT CARDIOVERSION  NAME:  Micheal Wright   MRN: [id number] DOB:  [birth year]   ADMIT DATE: 11/14/2015   INDICATIONS: Atrial fibrillation    PROCEDURE:   Informed consent was obtained prior to the procedure. The risks, benefits and alternatives for the procedure were discussed and the patient comprehended these risks. Once an appropriate time out was taken, the patient had the defibrillator pads placed in the anterior and posterior position. The patient then underwent sedation by the anesthesia service. Once an appropriate level of sedation was achieved, the patient received a biphasic, synchronized 150J shock without effect. We then delivered a 200J shock with prompt conversion to sinus rhythm. No apparent complications.  Lorree Millar,MD 2:19 PM

## 2015-11-15 ENCOUNTER — Encounter (HOSPITAL_COMMUNITY): Payer: Self-pay | Admitting: Internal Medicine

## 2015-11-20 ENCOUNTER — Ambulatory Visit (HOSPITAL_COMMUNITY)
Admission: RE | Admit: 2015-11-20 | Discharge: 2015-11-20 | Disposition: A | Discharge status: Home / Self Care | Payer: Medicare Other | Source: Ambulatory Visit | Attending: Internal Medicine | Admitting: Internal Medicine

## 2015-11-20 DIAGNOSIS — I480 Paroxysmal atrial fibrillation: Secondary | ICD-10-CM | POA: Diagnosis not present

## 2015-12-01 ENCOUNTER — Other Ambulatory Visit (HOSPITAL_COMMUNITY): Payer: Self-pay | Admitting: Internal Medicine

## 2015-12-27 DIAGNOSIS — H903 Sensorineural hearing loss, bilateral: Secondary | ICD-10-CM | POA: Diagnosis not present

## 2015-12-27 DIAGNOSIS — H6123 Impacted cerumen, bilateral: Secondary | ICD-10-CM | POA: Diagnosis not present

## 2016-01-05 DIAGNOSIS — H918X3 Other specified hearing loss, bilateral: Secondary | ICD-10-CM | POA: Diagnosis not present

## 2016-01-05 DIAGNOSIS — H903 Sensorineural hearing loss, bilateral: Secondary | ICD-10-CM | POA: Diagnosis not present

## 2016-01-22 DIAGNOSIS — H903 Sensorineural hearing loss, bilateral: Secondary | ICD-10-CM | POA: Diagnosis not present

## 2016-03-20 DIAGNOSIS — L821 Other seborrheic keratosis: Secondary | ICD-10-CM | POA: Diagnosis not present

## 2016-03-20 DIAGNOSIS — L570 Actinic keratosis: Secondary | ICD-10-CM | POA: Diagnosis not present

## 2016-03-20 DIAGNOSIS — X32XXXA Exposure to sunlight, initial encounter: Secondary | ICD-10-CM | POA: Diagnosis not present

## 2016-03-20 DIAGNOSIS — Z85828 Personal history of other malignant neoplasm of skin: Secondary | ICD-10-CM | POA: Diagnosis not present

## 2016-03-20 DIAGNOSIS — S20461A Insect bite (nonvenomous) of right back wall of thorax, initial encounter: Secondary | ICD-10-CM | POA: Diagnosis not present

## 2016-03-20 DIAGNOSIS — D1801 Hemangioma of skin and subcutaneous tissue: Secondary | ICD-10-CM | POA: Diagnosis not present

## 2016-04-03 ENCOUNTER — Encounter: Payer: Self-pay | Admitting: Family Medicine

## 2016-04-03 ENCOUNTER — Ambulatory Visit (INDEPENDENT_AMBULATORY_CARE_PROVIDER_SITE_OTHER): Payer: Medicare Other | Admitting: Family Medicine

## 2016-04-03 VITALS — BP 124/62 | HR 72 | Temp 97.6°F | Resp 16 | Ht 69.5 in | Wt 199.0 lb

## 2016-04-03 DIAGNOSIS — E785 Hyperlipidemia, unspecified: Secondary | ICD-10-CM

## 2016-04-03 DIAGNOSIS — H9313 Tinnitus, bilateral: Secondary | ICD-10-CM | POA: Diagnosis not present

## 2016-04-03 DIAGNOSIS — R739 Hyperglycemia, unspecified: Secondary | ICD-10-CM | POA: Diagnosis not present

## 2016-04-03 DIAGNOSIS — I480 Paroxysmal atrial fibrillation: Secondary | ICD-10-CM | POA: Diagnosis not present

## 2016-04-03 DIAGNOSIS — I10 Essential (primary) hypertension: Secondary | ICD-10-CM

## 2016-04-03 DIAGNOSIS — Z0000 Encounter for general adult medical examination without abnormal findings: Secondary | ICD-10-CM

## 2016-04-03 NOTE — Progress Notes (Signed)
Patient ID: Micheal Wright, male   DOB: Nov 05, 1941, 75 y.o.   MRN: [id number]  Visit Date: 04/03/2016  Today's Provider: [name], MD   Chief Complaint  Patient presents with  . Medicare Wellness   Subjective:   DEQUANTA Wright is a 75 y.o. male who presents today for his Subsequent Annual Wellness Visit. He feels fairly well. He reports exercising no routine exercise regimeen but stays active daily. He reports he is sleeping well. Patient now wears bilateral hearing aids after hearing loss at a gun range. Since then he has had ongoing tinnitus. Dr. Tami Ribas now has  him on N acetylcysteine. Immunization History  Administered Date(s) Administered  . Influenza Whole 10/02/2005  . Influenza, High Dose Seasonal PF 09/13/2015  . Td 10/02/2002   Last colonoscopy 11/13/15 internal hemorrhoids otherwise normal Patient also had cardio version in December 2016 for atrial fib.  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 Patient is seen specialist for that but he has been bothered by severe tinnitus all the time and wanted to see someone for this issue.  Review of Systems  Constitutional: Negative.   HENT: Positive for hearing loss and tinnitus.   Eyes: Negative.   Respiratory: Negative.   Cardiovascular: Negative.   Gastrointestinal: Negative.   Endocrine: Negative.   Genitourinary: Negative.   Musculoskeletal: Negative.   Skin: Negative.   Allergic/Immunologic: Negative.   Neurological: Negative.   Hematological: Negative.   Psychiatric/Behavioral: Negative.     Patient Active Problem List   Diagnosis Date Noted  . PAF (paroxysmal atrial fibrillation) ([place]) 11/14/2015  . Basal cell carcinoma of skin 10/16/2015  . Benign prostatic hyperplasia with urinary obstruction 10/16/2015  . Neuropathy ([place]) 10/16/2015  . Chest  tightness 01/20/2015  . Posthitis 01/10/2015  . PAC (premature atrial contraction) 05/04/2014  . Atrial complex, premature 05/04/2014  . APC (atrial premature contractions) 05/04/2014  . Blood in semen 08/31/2012  . Murmur, cardiac 09/03/2011  . Cardiac murmur 09/03/2011  . 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 DISTURBANCE OF SKIN SENSATION 12/21/2009  . Carotid stenosis 08/22/2009  . Carotid artery obstruction 08/22/2009  . Low back pain 03/24/2009  . Cerebrovascular disease 11/17/2008  . Atherosclerosis of coronary artery 11/08/2008  . Hypercholesteremia 11/08/2008  . COLD SORE 11/12/2007  . Herpes 11/12/2007  . HYPERLIPIDEMIA 05/11/2007  . ERECTILE DYSFUNCTION 05/11/2007  . Essential hypertension 05/11/2007  . Coronary artery disease 05/11/2007  . GERD 05/11/2007  . SLEEP APNEA 05/11/2007  . COLONIC POLYPS, HX OF 05/11/2007  . Gastro-esophageal reflux disease without esophagitis 05/11/2007  . History of colon polyps 05/11/2007    Social History   Social History  . Marital Status: Married    Spouse Name: N/A  . Number of Children: N/A  . Years of Education: N/A   Occupational History  . 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 Smoking status: Never Smoker   . Smokeless tobacco: Never Used  . Alcohol Use: No  . Drug Use: No  . Sexual Activity: No   Other Topics Concern  . Not on file   Social History Narrative   Married with 3 children    Past Surgical History  Procedure Laterality Date  . Appendectomy  1960's  .  Coronary artery bypass graft  1998  . Cardiac catheterization  2002  . Knee arthroscopy w/ partial medial meniscectomy  1990s    Left knee  . Esophagogastroduodenoscopy  04/2004    Inflam. at Z line (biopsy neg)  . Colonoscopy w/ polypectomy    . Cardioversion N/A 11/14/2015    Procedure: CARDIOVERSION;  Surgeon: Jolaine Artist,  MD;  [facility];  Service: Cardiovascular;  Laterality: N/A;  . Colonoscopy with propofol N/A 11/13/2015    Procedure: COLONOSCOPY WITH PROPOFOL;  Surgeon: Manya Silvas, MD;  Location: [facility] ENDOSCOPY;  Service: Endoscopy;  Laterality: N/A;    His family history includes Breast cancer in his sister; Heart attack in his father; Heart disease in his brother; Rheumatologic disease in his brother; Stroke in his mother. There is no history of Hypertension, Diabetes, Prostate cancer, or Colon cancer.    Outpatient Prescriptions Prior to Visit  Medication Sig Dispense Refill  . apixaban (ELIQUIS) 5 MG TABS tablet Take 5 mg by mouth 2 (two) times daily.    Marland Kitchen atorvastatin (LIPITOR) 80 MG tablet Take 1 tablet (80 mg total) by mouth daily. 30 tablet 11  . nitroGLYCERIN (NITROSTAT) 0.4 MG SL tablet Place 1 tablet (0.4 mg total) under the tongue every 5 (five) minutes as needed. May repeat for up to 3 doses. 25 tablet 3  . omeprazole (PRILOSEC) 40 MG capsule Take 40 mg by mouth daily.    Marland Kitchen losartan (COZAAR) 100 MG tablet TAKE 1/2 TABLET BY MOUTH DAILY 15 tablet 11  . sildenafil (REVATIO) 20 MG tablet TAKE 2 TO 5 TABLETS BY MOUTH DAILY AS NEEDED 50 tablet 0   No facility-administered medications prior to visit.    No Known Allergies  Patient Care Team: Jerrol Banana., MD as PCP - General (Unknown Physician Specialty)  Objective:   Vitals:  Filed Vitals:   04/03/16 0911  BP: 124/62  Pulse: 72  Temp: 97.6 F (36.4 C)  Resp: 16  Height: 5' 9.5" (1.765 m)  Weight: 199 lb (90.266 kg)    Physical Exam  Constitutional: He is oriented to person, place, and time. He appears well-developed.  HENT:  Head: Normocephalic and atraumatic.  Right Ear: External ear normal.  Left Ear: External ear normal.  Eyes: Conjunctivae are normal. Pupils are equal, round, and reactive to light.  Neck: Normal range of motion. Neck supple.  Cardiovascular: Normal rate, regular rhythm, normal  heart sounds and intact distal pulses.   No murmur heard. Pulmonary/Chest: Effort normal and breath sounds normal. No respiratory distress. He has no wheezes.  Abdominal: Soft. There is no tenderness. There is no rebound.  Genitourinary: Penis normal.  Musculoskeletal: He exhibits no edema.  Neurological: He is alert and oriented to person, place, and time.  Skin: Skin is warm and dry. No rash noted.  Psychiatric: He has a normal mood and affect. His behavior is normal. Judgment and thought content normal.    Activities of Daily Living In your present state of health, do you have any difficulty performing the following activities: 04/03/2016 10/16/2015  Hearing? Tempie Donning  Vision? N -  Difficulty concentrating or making decisions? N -  Walking or climbing stairs? N -  Dressing or bathing? N -  Doing errands, shopping? N -    Fall Risk Assessment Fall Risk  04/03/2016 10/16/2015 10/16/2015  Falls in the past year? No No No     Depression Screen PHQ 2/9 Scores 04/03/2016 10/16/2015 10/16/2015  PHQ - 2  Score 0 0 0    Cognitive Testing - 6-CIT    Year: 0 4 points  Month: 0 3 points  Memorize "Pia Mau, [address] [place]"  Time (within 1 hour:) 0 3 points  Count backwards from 20: 0 2 4 points  Name months of year: 0 2 4 points  Repeat Address: 0 2 4 6 8 10  points   Total Score: 3/28  Interpretation : Normal (0-7) Abnormal (8-28)    Assessment & Plan:     Annual Wellness Visit  Reviewed patient's Family Medical History Reviewed and updated list of patient's medical providers Assessment of cognitive impairment was done Assessed patient's functional ability Established a written schedule for health screening [place] Completed and Reviewed  1. Medicare annual wellness visit, subsequent  2. Essential hypertension - TSH  3. Hyperlipidemia - Comprehensive metabolic panel - Lipid Panel With LDL/HDL Ratio  4. PAF (paroxysmal atrial fibrillation)  (HCC) - CBC with Differential/Platelet - TSH On anticoagulation and followed by cardiology. 5. Hyperglycemia - HgB A1c 6. Tinnitus I have done the exam and reviewed the above chart and it is accurate to the best of my knowledge.  Patient was seen and examined by Dr. Eulas Post and note was scribed by Theressa Millard, RMA.   Miguel Aschoff MD [facility] 04/03/2016 9:12 AM  ------------------------------------------------------------------------------------------------------------

## 2016-04-09 DIAGNOSIS — R739 Hyperglycemia, unspecified: Secondary | ICD-10-CM | POA: Diagnosis not present

## 2016-04-09 DIAGNOSIS — I480 Paroxysmal atrial fibrillation: Secondary | ICD-10-CM | POA: Diagnosis not present

## 2016-04-09 DIAGNOSIS — E785 Hyperlipidemia, unspecified: Secondary | ICD-10-CM | POA: Diagnosis not present

## 2016-04-09 DIAGNOSIS — I10 Essential (primary) hypertension: Secondary | ICD-10-CM | POA: Diagnosis not present

## 2016-04-10 LAB — COMPREHENSIVE METABOLIC PANEL
A/G RATIO: 1.5 (ref 1.2–2.2)
ALBUMIN: 3.9 g/dL (ref 3.5–4.8)
ALK PHOS: 112 IU/L (ref 39–117)
ALT: 12 IU/L (ref 0–44)
AST: 15 IU/L (ref 0–40)
BILIRUBIN TOTAL: 1 mg/dL (ref 0.0–1.2)
BUN/Creatinine Ratio: 11 (ref 10–24)
BUN: 11 mg/dL (ref 8–27)
CHLORIDE: 104 mmol/L (ref 96–106)
CO2: 24 mmol/L (ref 18–29)
Calcium: 9.4 mg/dL (ref 8.6–10.2)
Creatinine, Ser: 1.03 mg/dL (ref 0.76–1.27)
GFR calc Af Amer: 82 mL/min/{1.73_m2} (ref >59)
GFR calc non Af Amer: 71 mL/min/{1.73_m2} (ref >59)
GLOBULIN, TOTAL: 2.6 g/dL (ref 1.5–4.5)
Glucose: 98 mg/dL (ref 65–99)
POTASSIUM: 5.3 mmol/L — AB (ref 3.5–5.2)
SODIUM: 143 mmol/L (ref 134–144)
Total Protein: 6.5 g/dL (ref 6.0–8.5)

## 2016-04-10 LAB — LIPID PANEL WITH LDL/HDL RATIO
Cholesterol, Total: 95 mg/dL — ABNORMAL LOW (ref 100–199)
HDL: 33 mg/dL — ABNORMAL LOW (ref >39)
LDL Calculated: 46 mg/dL (ref 0–99)
LDL/HDL RATIO: 1.4 ratio (ref 0.0–3.6)
Triglycerides: 79 mg/dL (ref 0–149)
VLDL Cholesterol Cal: 16 mg/dL (ref 5–40)

## 2016-04-10 LAB — CBC WITH DIFFERENTIAL/PLATELET
Basophils Absolute: 0 10*3/uL (ref 0.0–0.2)
Basos: 1 %
EOS (ABSOLUTE): 0.1 10*3/uL (ref 0.0–0.4)
EOS: 2 %
HEMATOCRIT: 39.2 % (ref 37.5–51.0)
Hemoglobin: 13.2 g/dL (ref 12.6–17.7)
Immature Grans (Abs): 0 10*3/uL (ref 0.0–0.1)
Immature Granulocytes: 0 %
LYMPHS ABS: 1.8 10*3/uL (ref 0.7–3.1)
Lymphs: 34 %
MCH: 31 pg (ref 26.6–33.0)
MCHC: 33.7 g/dL (ref 31.5–35.7)
MCV: 92 fL (ref 79–97)
MONOS ABS: 0.5 10*3/uL (ref 0.1–0.9)
Monocytes: 10 %
NEUTROS ABS: 2.8 10*3/uL (ref 1.4–7.0)
Neutrophils: 53 %
Platelets: 251 10*3/uL (ref 150–379)
RBC: 4.26 x10E6/uL (ref 4.14–5.80)
RDW: 13.3 % (ref 12.3–15.4)
WBC: 5.2 10*3/uL (ref 3.4–10.8)

## 2016-04-10 LAB — HEMOGLOBIN A1C: Est. average glucose Bld gHb Est-mCnc: <77 mg/dL

## 2016-04-10 LAB — TSH: TSH: 1.22 u[IU]/mL (ref 0.450–4.500)

## 2016-05-13 ENCOUNTER — Other Ambulatory Visit (HOSPITAL_COMMUNITY): Payer: Self-pay | Admitting: *Deleted

## 2016-07-05 DIAGNOSIS — H9312 Tinnitus, left ear: Secondary | ICD-10-CM | POA: Diagnosis not present

## 2016-07-05 DIAGNOSIS — H903 Sensorineural hearing loss, bilateral: Secondary | ICD-10-CM | POA: Diagnosis not present

## 2016-08-06 ENCOUNTER — Other Ambulatory Visit (HOSPITAL_COMMUNITY): Payer: Self-pay | Admitting: Internal Medicine

## 2016-08-07 ENCOUNTER — Other Ambulatory Visit (HOSPITAL_COMMUNITY): Payer: Self-pay | Admitting: *Deleted

## 2016-08-07 ENCOUNTER — Encounter (HOSPITAL_COMMUNITY): Payer: Self-pay | Admitting: Internal Medicine

## 2016-08-07 ENCOUNTER — Ambulatory Visit (HOSPITAL_COMMUNITY)
Admission: RE | Admit: 2016-08-07 | Discharge: 2016-08-07 | Disposition: A | Discharge status: Home / Self Care | Payer: Medicare Other | Source: Ambulatory Visit | Attending: Internal Medicine | Admitting: Internal Medicine

## 2016-08-07 VITALS — BP 146/68 | HR 78 | Wt 197.0 lb

## 2016-08-07 DIAGNOSIS — G4730 Sleep apnea, unspecified: Secondary | ICD-10-CM | POA: Insufficient documentation

## 2016-08-07 DIAGNOSIS — K219 Gastro-esophageal reflux disease without esophagitis: Secondary | ICD-10-CM | POA: Insufficient documentation

## 2016-08-07 DIAGNOSIS — Z86010 Personal history of colonic polyps: Secondary | ICD-10-CM | POA: Insufficient documentation

## 2016-08-07 DIAGNOSIS — R001 Bradycardia, unspecified: Secondary | ICD-10-CM | POA: Insufficient documentation

## 2016-08-07 DIAGNOSIS — I2583 Coronary atherosclerosis due to lipid rich plaque: Secondary | ICD-10-CM

## 2016-08-07 DIAGNOSIS — Z951 Presence of aortocoronary bypass graft: Secondary | ICD-10-CM | POA: Insufficient documentation

## 2016-08-07 DIAGNOSIS — E875 Hyperkalemia: Secondary | ICD-10-CM | POA: Insufficient documentation

## 2016-08-07 DIAGNOSIS — N529 Male erectile dysfunction, unspecified: Secondary | ICD-10-CM | POA: Insufficient documentation

## 2016-08-07 DIAGNOSIS — E785 Hyperlipidemia, unspecified: Secondary | ICD-10-CM | POA: Diagnosis not present

## 2016-08-07 DIAGNOSIS — I10 Essential (primary) hypertension: Secondary | ICD-10-CM | POA: Diagnosis not present

## 2016-08-07 DIAGNOSIS — Z7901 Long term (current) use of anticoagulants: Secondary | ICD-10-CM | POA: Diagnosis not present

## 2016-08-07 DIAGNOSIS — I6529 Occlusion and stenosis of unspecified carotid artery: Secondary | ICD-10-CM | POA: Diagnosis not present

## 2016-08-07 DIAGNOSIS — I2510 Atherosclerotic heart disease of native coronary artery without angina pectoris: Secondary | ICD-10-CM | POA: Insufficient documentation

## 2016-08-07 DIAGNOSIS — I480 Paroxysmal atrial fibrillation: Secondary | ICD-10-CM | POA: Diagnosis not present

## 2016-08-07 LAB — BASIC METABOLIC PANEL
ANION GAP: 5 (ref 5–15)
BUN: 12 mg/dL (ref 6–20)
CALCIUM: 9.6 mg/dL (ref 8.9–10.3)
CO2: 26 mmol/L (ref 22–32)
CREATININE: 1.01 mg/dL (ref 0.61–1.24)
Chloride: 110 mmol/L (ref 101–111)
GFR calc Af Amer: >60 mL/min (ref >60)
GFR calc non Af Amer: >60 mL/min (ref >60)
GLUCOSE: 80 mg/dL (ref 65–99)
Potassium: 4.3 mmol/L (ref 3.5–5.1)
Sodium: 141 mmol/L (ref 135–145)

## 2016-08-07 MED ORDER — AMLODIPINE BESYLATE 5 MG PO TABS: 5.0000 mg | ORAL_TABLET | Freq: Every day | ORAL | 3 refills | Status: DC

## 2016-08-07 MED ORDER — APIXABAN 5 MG PO TABS: 5.0000 mg | ORAL_TABLET | Freq: Two times a day (BID) | ORAL | 3 refills | Status: DC

## 2016-08-07 NOTE — Patient Instructions (Signed)
Start Amlodipine (Norvasc) 5mg  (1 tablet)daily.  Routine lab work today. Will notify you of abnormal results  Follow up with Dr.Bensimhon in 3 months

## 2016-08-07 NOTE — Telephone Encounter (Signed)
error 

## 2016-08-07 NOTE — Addendum Note (Signed)
Encounter addended by: Harvie Junior, CMA on: 08/07/2016 12:03 PM<BR>    Actions taken: Order Entry activity accessed, Diagnosis association updated, Sign clinical note

## 2016-08-07 NOTE — Progress Notes (Signed)
Cardiology Clinic Note   PCP: Dr Rosanna Randy HPI:  Micheal Wright is a very pleasant 75 year old male with a history of coronary artery disease status post coronary artery bypass grafting in 1998.  Last catheterization was in January 2002 due to an abnormal stress test with 4 mm of ST elevation during exercise.  Cardiac catheterization showed patent grafts.  He has a history of hypertension, PAF, hyperlipidemia, and sleep apnea. Myoview 2/09 EF 61% No ischemia.   Had episode PAF in 12/16 during colonoscopy. Underwent DC-CV. Started Eliquis.   He returns today for f/u visit. Has been dealing muscle aches - unable to take ibuprofen due to Eliquis switched to Tylenol without much benefit. (Has stopped statin in past without benefit). Also still with ringing in his ears chronically. Very stressed. Has been taking BP routinely and SBP 140-170. No CP. Breathing ok. Remains active around the house without problem. Walks 1 mile every day in 20 mins.     Echo 01/2015 EF 60-65%, no AR, Mild MR/TR, PA peak pressure 33 mmHg, GLPSS -21% LA size 3.8cm Carotid US 01/2015 with minimal plaque bilaterally. Repeat 2 years.  Carotid u/s 11/2012  R okL 40-59% (stable).                       3/16: 1-39% bilaterally ABI 12/2009  R 1.0  L 1.1  Lipids checked with Dr. Rosanna Randy  08/2013 Total Cholesterol: 121 TG 82 HDL 44 LDL 61   ROS: All systems negative except as listed in HPI, PMH and Problem List.  Past Medical History:  Diagnosis Date  . CAD (coronary artery disease)    s/p CABG 1998; Last cath 2002 in setting of marked ST elevation during stress testing. patent grafts; Echo 8/09 EF 55%. No significant valvular disease.  . Carotid artery disease ([place])    u/s 9/10: R 40-59% L 0-39%  . Erectile dysfunction   . GERD (gastroesophageal reflux disease)   . History of colonic polyps   . Hyperlipidemia   . Hypertension   . Sleep apnea     Current Outpatient Prescriptions  Medication Sig Dispense Refill  .  atorvastatin (LIPITOR) 80 MG tablet Take 1 tablet (80 mg total) by mouth daily. 30 tablet 11  . ELIQUIS 5 MG TABS tablet TAKE 1 TABLET BY MOUTH TWICE A DAY 30 tablet 3  . losartan (COZAAR) 50 MG tablet Take 50 mg by mouth daily.    Marland Kitchen omeprazole (PRILOSEC) 40 MG capsule Take 40 mg by mouth daily.    . nitroGLYCERIN (NITROSTAT) 0.4 MG SL tablet Place 1 tablet (0.4 mg total) under the tongue every 5 (five) minutes as needed. May repeat for up to 3 doses. (Patient not taking: Reported on 08/07/2016) 25 tablet 3   No current facility-administered medications for this encounter.    Vitals:   08/07/16 1123  BP: (!) 146/68  Pulse: 78  SpO2: 99%  Weight: 197 lb (89.4 kg)    PHYSICAL EXAM: General:  Well appearing. No resp difficulty HEENT: normal Neck: supple. JVP flat. Carotids 2+ bilaterally; no bruits. No thyromegaly or nodule noted. Cor: PMI normal. Irregular rate & rhythm. No rubs, gallops, no murmur Crisp s2. Lungs: CTAB, normal effort Abdomen: soft, NT, ND, no HSM. No bruits or masses. +BS  Extremities: no cyanosis, clubbing, rash, edema Neuro: alert & orientedx3, cranial nerves grossly intact. Moves all 4 extremities w/o difficulty. Affect pleasant.    ASSESSMENT & PLAN:  1) Paroxysmal a fib.  -  Bcck in AF today. Rate controlled. Now likely chronic. Tolerating well - CHADS VASC 3. Will continue Eliquis.  2) CAD, s/p CABG 1998 - No ischemia. Myoview 3/16 ok.  3) HTN - BP elevated today. Unable to increase losartant due to hyperkalemia. Start Norvasc 5.  4) HL - managed by Dr. Rosanna Randy. HDL improved. 5) Bradycardia - Stopped Toprol. Doing well. 6) Carotid stenosis, mild -F/u every 2 years. Last scan 3/16 1-39% bilaterally.   [name], MD 11:43 AM

## 2016-08-13 ENCOUNTER — Ambulatory Visit (HOSPITAL_COMMUNITY)
Admission: RE | Admit: 2016-08-13 | Discharge: 2016-08-13 | Disposition: A | Discharge status: Home / Self Care | Payer: Medicare Other | Source: Ambulatory Visit | Attending: Cardiology | Admitting: Cardiology

## 2016-08-13 VITALS — BP 138/74 | HR 78 | Wt 197.0 lb

## 2016-08-13 DIAGNOSIS — I10 Essential (primary) hypertension: Secondary | ICD-10-CM | POA: Diagnosis not present

## 2016-08-13 NOTE — Progress Notes (Signed)
Pt brought his BP machine from home, it read BP as 148/90, manual check was 138/74.  Pt will continue to monitor and take meds as prescribed.  He will call back next week with update on readings.

## 2016-08-16 ENCOUNTER — Other Ambulatory Visit (HOSPITAL_COMMUNITY): Payer: Self-pay | Admitting: Internal Medicine

## 2016-08-16 ENCOUNTER — Other Ambulatory Visit: Payer: Self-pay | Admitting: Family Medicine

## 2016-08-16 DIAGNOSIS — I2510 Atherosclerotic heart disease of native coronary artery without angina pectoris: Secondary | ICD-10-CM

## 2016-08-19 ENCOUNTER — Telehealth: Payer: Self-pay

## 2016-08-19 MED ORDER — RANITIDINE HCL 150 MG PO TABS: 150.0000 mg | ORAL_TABLET | Freq: Two times a day (BID) | ORAL | 12 refills | Status: DC | PRN

## 2016-08-19 NOTE — Telephone Encounter (Signed)
Ok--150mg  BID prn

## 2016-08-19 NOTE — Telephone Encounter (Signed)
Received fax from [facility] stating that patient wants to switch Omeprazole to Ranitidine. Please review-aa

## 2016-08-19 NOTE — Telephone Encounter (Signed)
Done-aa 

## 2016-08-23 ENCOUNTER — Ambulatory Visit (INDEPENDENT_AMBULATORY_CARE_PROVIDER_SITE_OTHER): Payer: Medicare Other

## 2016-08-23 DIAGNOSIS — Z23 Encounter for immunization: Secondary | ICD-10-CM | POA: Diagnosis not present

## 2016-09-05 ENCOUNTER — Other Ambulatory Visit (HOSPITAL_COMMUNITY): Payer: Self-pay | Admitting: Internal Medicine

## 2016-09-05 DIAGNOSIS — I2510 Atherosclerotic heart disease of native coronary artery without angina pectoris: Secondary | ICD-10-CM

## 2016-09-19 DIAGNOSIS — L570 Actinic keratosis: Secondary | ICD-10-CM | POA: Diagnosis not present

## 2016-09-19 DIAGNOSIS — X32XXXA Exposure to sunlight, initial encounter: Secondary | ICD-10-CM | POA: Diagnosis not present

## 2016-09-19 DIAGNOSIS — Z85828 Personal history of other malignant neoplasm of skin: Secondary | ICD-10-CM | POA: Diagnosis not present

## 2016-10-02 ENCOUNTER — Other Ambulatory Visit: Payer: Self-pay | Admitting: Family Medicine

## 2016-10-16 DIAGNOSIS — H2513 Age-related nuclear cataract, bilateral: Secondary | ICD-10-CM | POA: Diagnosis not present

## 2016-10-17 DIAGNOSIS — H6123 Impacted cerumen, bilateral: Secondary | ICD-10-CM | POA: Diagnosis not present

## 2016-10-17 DIAGNOSIS — H903 Sensorineural hearing loss, bilateral: Secondary | ICD-10-CM | POA: Diagnosis not present

## 2016-11-28 ENCOUNTER — Other Ambulatory Visit (HOSPITAL_COMMUNITY): Payer: Self-pay | Admitting: Internal Medicine

## 2016-12-06 ENCOUNTER — Telehealth: Payer: Self-pay

## 2016-12-06 NOTE — Telephone Encounter (Signed)
Called Pt to schedule AWV with [facility] - knb °

## 2017-01-04 ENCOUNTER — Other Ambulatory Visit (HOSPITAL_COMMUNITY): Payer: Self-pay | Admitting: Internal Medicine

## 2017-01-04 DIAGNOSIS — I2510 Atherosclerotic heart disease of native coronary artery without angina pectoris: Secondary | ICD-10-CM

## 2017-01-13 DIAGNOSIS — H903 Sensorineural hearing loss, bilateral: Secondary | ICD-10-CM | POA: Diagnosis not present

## 2017-01-13 DIAGNOSIS — H6123 Impacted cerumen, bilateral: Secondary | ICD-10-CM | POA: Diagnosis not present

## 2017-03-27 ENCOUNTER — Other Ambulatory Visit (HOSPITAL_COMMUNITY): Payer: Self-pay | Admitting: Internal Medicine

## 2017-04-08 ENCOUNTER — Encounter: Payer: Medicare Other | Admitting: Family Medicine

## 2017-04-23 ENCOUNTER — Ambulatory Visit (INDEPENDENT_AMBULATORY_CARE_PROVIDER_SITE_OTHER): Payer: Medicare Other | Admitting: Family Medicine

## 2017-04-23 ENCOUNTER — Encounter: Payer: Self-pay | Admitting: Family Medicine

## 2017-04-23 ENCOUNTER — Ambulatory Visit: Payer: Medicare Other

## 2017-04-23 VITALS — BP 132/70 | HR 85 | Temp 97.7°F | Resp 16 | Ht 70.0 in | Wt 200.0 lb

## 2017-04-23 DIAGNOSIS — E7800 Pure hypercholesterolemia, unspecified: Secondary | ICD-10-CM | POA: Diagnosis not present

## 2017-04-23 DIAGNOSIS — G8929 Other chronic pain: Secondary | ICD-10-CM

## 2017-04-23 DIAGNOSIS — M5440 Lumbago with sciatica, unspecified side: Secondary | ICD-10-CM

## 2017-04-23 DIAGNOSIS — I10 Essential (primary) hypertension: Secondary | ICD-10-CM

## 2017-04-23 DIAGNOSIS — I2510 Atherosclerotic heart disease of native coronary artery without angina pectoris: Secondary | ICD-10-CM

## 2017-04-23 DIAGNOSIS — Z0000 Encounter for general adult medical examination without abnormal findings: Secondary | ICD-10-CM | POA: Diagnosis not present

## 2017-04-23 NOTE — Patient Instructions (Signed)

## 2017-04-23 NOTE — Progress Notes (Signed)
Patient: Micheal Wright, Male    DOB: 1941-08-13, 76 y.o.   MRN: [id number] Visit Date: 04/23/2017  Today's Provider: [name], MD   Chief Complaint  Patient presents with  . Annual Wellness Visit   Subjective:    Annual wellness visit Micheal Wright is a 76 y.o. male. He feels well. He reports exercising occasionally. He reports he is sleeping well. He averages 4-5 hours at night. He has joint stiffness and slowly worsening fatigue.  Colonoscopy- 11/13/2015. Internal hemorrhoids. Tdap- 04/17/2015   Tick bite: Patient reports that he pulled a tick off a few days ago. Patient denies any itching or redness. This is just an [name].   Review of Systems  Constitutional: Negative.   HENT: Negative.   Eyes: Negative.   Respiratory: Negative.   Cardiovascular: Negative.   Gastrointestinal: Negative.   Endocrine: Negative.   Genitourinary: Negative.   Musculoskeletal: Positive for arthralgias and back pain.  Skin: Negative.   Allergic/Immunologic: Negative.   Neurological: Negative.   Hematological: Negative.   Psychiatric/Behavioral: Negative.     Social History   Social History  . Marital status: Married    Spouse name: N/A  . Number of children: N/A  . Years of education: N/A   Occupational History  . 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 Smoking status: Never Smoker  . Smokeless tobacco: Never Used  . Alcohol use No  . Drug use: No  . Sexual activity: No   Other Topics Concern  . Not on file   Social History Narrative   Married with 3 children    Past Medical History:  Diagnosis Date  . CAD (coronary artery disease)    s/p CABG 1998; Last cath 2002 in setting of marked ST elevation during stress testing. patent grafts; Echo 8/09 EF 55%. No significant valvular disease.  . Carotid artery disease ([place])    u/s 9/10: R 40-59% L 0-39%  . Erectile dysfunction   . GERD (gastroesophageal reflux disease)   . History of colonic  polyps   . Hyperlipidemia   . Hypertension   . Sleep apnea      Patient Active Problem List   Diagnosis Date Noted  . PAF (paroxysmal atrial fibrillation) ([place]) 11/14/2015  . Basal cell carcinoma of skin 10/16/2015  . Benign prostatic hyperplasia with urinary obstruction 10/16/2015  . Neuropathy 10/16/2015  . Chest tightness 01/20/2015  . Posthitis 01/10/2015  . PAC (premature atrial contraction) 05/04/2014  . Atrial complex, premature 05/04/2014  . APC (atrial premature contractions) 05/04/2014  . Blood in semen 08/31/2012  . Murmur, cardiac 09/03/2011  . Cardiac murmur 09/03/2011  . 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 DISTURBANCE OF SKIN SENSATION 12/21/2009  . Carotid stenosis 08/22/2009  . Carotid artery obstruction 08/22/2009  . Low back pain 03/24/2009  . Cerebrovascular disease 11/17/2008  . Atherosclerosis of coronary artery 11/08/2008  . Hypercholesteremia 11/08/2008  . COLD SORE 11/12/2007  . Herpes 11/12/2007  . Hyperlipidemia 05/11/2007  . ERECTILE DYSFUNCTION 05/11/2007  . Essential hypertension 05/11/2007  . Coronary artery disease 05/11/2007  . GERD 05/11/2007  . SLEEP APNEA 05/11/2007  . COLONIC POLYPS, HX OF 05/11/2007  . Gastro-esophageal reflux disease without esophagitis 05/11/2007  . History of colon polyps 05/11/2007    Past Surgical History:  Procedure Laterality Date  . ANGIOPLASTY     x 2  .  APPENDECTOMY  1960's  . CARDIAC CATHETERIZATION  2002  . CARDIOVERSION N/A 11/14/2015   Procedure: CARDIOVERSION;  Surgeon: Jolaine Artist, MD;  Location[facility] ENDOSCOPY;  Service: Cardiovascular;  Laterality: N/A;  . COLONOSCOPY W/ POLYPECTOMY    . COLONOSCOPY WITH PROPOFOL N/A 11/13/2015   Procedure: COLONOSCOPY WITH PROPOFOL;  Surgeon: Manya Silvas, MD;  Location: [facility] ENDOSCOPY;  Service: Endoscopy;  Laterality: N/A;  . CORONARY ARTERY BYPASS GRAFT   1998  . ESOPHAGOGASTRODUODENOSCOPY  04/2004   Inflam. at Z line (biopsy neg)  . KNEE ARTHROSCOPY W/ PARTIAL MEDIAL MENISCECTOMY  1990s   Left knee    His family history includes Alcohol abuse in his father; Breast cancer in his sister; Heart attack in his father; Heart disease in his brother and sister; Rheumatologic disease in his brother; Stroke in his mother.      Current Outpatient Prescriptions:  .  amLODipine (NORVASC) 5 MG tablet, TAKE 1 TABLET BY MOUTH DAILY, Disp: 90 tablet, Rfl: 3 .  apixaban (ELIQUIS) 5 MG TABS tablet, Take 1 tablet (5 mg total) by mouth 2 (two) times daily., Disp: 180 tablet, Rfl: 3 .  atorvastatin (LIPITOR) 80 MG tablet, TAKE 1 TABLET BY MOUTH DAILY, Disp: 30 tablet, Rfl: 3 .  losartan (COZAAR) 100 MG tablet, TAKE 1/2 TABLET BY MOUTH ONCE DAILY, Disp: 45 tablet, Rfl: 3 .  NITROSTAT 0.4 MG SL tablet, DISSOLVE 1 TABLET UNDER THE TONGUE FOR CHEST PAIN. MAY REPEAT EVERY 5MINUTES UP TO 3 DOSES. IF NO RELIEF, CALL 911**, Disp: 25 tablet, Rfl: 3 .  omeprazole (PRILOSEC) 40 MG capsule, TAKE ONE CAPSULE BY MOUTH DAILY, Disp: 30 capsule, Rfl: 8 .  ranitidine (ZANTAC) 150 MG tablet, Take 1 tablet (150 mg total) by mouth 2 (two) times daily as needed for heartburn., Disp: 60 tablet, Rfl: 12 .  losartan (COZAAR) 50 MG tablet, Take 50 mg by mouth daily., Disp: , Rfl:   Patient Care Team: Jerrol Banana., MD as PCP - General (Unknown Physician Specialty)     Objective:   Vitals: BP 132/70 (BP Location: Right Arm, Patient Position: Sitting, Cuff Size: Normal)   Pulse 85   Temp 97.7 F (36.5 C)   Resp 16   Ht 5\' 10"  (1.778 m)   Wt 200 lb (90.7 kg)   SpO2 95%   BMI 28.70 kg/m   Physical Exam  Constitutional: He is oriented to person, place, and time. He appears well-developed and well-nourished.  HENT:  Head: Normocephalic and atraumatic.  Right Ear: External ear normal.  Left Ear: External ear normal.  Nose: Nose normal.  Mouth/Throat: Oropharynx is  clear and moist.  Eyes: Conjunctivae are normal.  Neck: Neck supple. No thyromegaly present.  Cardiovascular: Normal rate, regular rhythm and normal heart sounds.   Pulmonary/Chest: Effort normal and breath sounds normal.  Abdominal: Soft. Bowel sounds are normal.  Neurological: He is alert and oriented to person, place, and time.  Skin: Skin is warm and dry.  Psychiatric: He has a normal mood and affect. His behavior is normal. Judgment and thought content normal.    Activities of Daily Living In your present state of health, do you have any difficulty performing the following activities: 04/23/2017  Hearing? Y  Vision? N  Difficulty concentrating or making decisions? N  Walking or climbing stairs? N  Dressing or bathing? N  Doing errands, shopping? N  Some recent data might be hidden    Fall Risk Assessment Fall Risk  04/23/2017 04/03/2016 10/16/2015  10/16/2015  Falls in the past year? No No No No     Depression Screen PHQ 2/9 Scores 04/23/2017 04/03/2016 10/16/2015 10/16/2015  PHQ - 2 Score 0 0 0 0    Cognitive Testing - 6-CIT  Correct? Score   What year is it? yes 0 0 or 4  What month is it? yes 0 0 or 3  Memorize:    Pia Mau,  42,  [address]  [place],      What time is it? (within 1 hour) yes 0 0 or 3  Count backwards from 20 yes 0 0, 2, or 4  Name the months of the year yes 0 0, 2, or 4  Repeat name & address above yes 0 0, 2, 4, 6, 8, or 10       TOTAL SCORE  0/28   Interpretation:  Normal  Normal (0-7) Abnormal (8-28)       Assessment & Plan:     Annual Wellness Visit  Reviewed patient's Family Medical History Reviewed and updated list of patient's medical providers Assessment of cognitive impairment was done Assessed patient's functional ability Established a written schedule for health screening [place] Completed and Reviewed  Exercise Activities and Dietary recommendations Goals    None      Immunization History    Administered Date(s) Administered  . Influenza Whole 10/02/2005  . Influenza, High Dose Seasonal PF 09/13/2015, 08/23/2016  . Pneumococcal Conjugate-13 04/17/2015  . Pneumococcal Polysaccharide-23 08/23/2008  . Td 10/02/2002  . Tdap 04/17/2015    Health Maintenance  Topic Date Due  . INFLUENZA VACCINE  07/02/2017  . TETANUS/TDAP  04/16/2025  . COLONOSCOPY  11/12/2025  . PNA vac Low Risk Adult  Completed     Discussed health benefits of physical activity, and encouraged him to engage in regular exercise appropriate for his age and condition.  AKs Refer to Derm. Tick Bite HTN HLD GERD AFib On Eliquis.       I have done the exam and reviewed the above chart and it is accurate to the best of my knowledge. Development worker, community has been used in this note in any air is in the dictation or transcription are unintentional.   [name], MD  [place]

## 2017-04-29 DIAGNOSIS — E7800 Pure hypercholesterolemia, unspecified: Secondary | ICD-10-CM | POA: Diagnosis not present

## 2017-04-29 DIAGNOSIS — I10 Essential (primary) hypertension: Secondary | ICD-10-CM | POA: Diagnosis not present

## 2017-04-30 LAB — CBC WITH DIFFERENTIAL/PLATELET
BASOS: 0 %
Basophils Absolute: 0 10*3/uL (ref 0.0–0.2)
EOS (ABSOLUTE): 0.2 10*3/uL (ref 0.0–0.4)
EOS: 3 %
HEMOGLOBIN: 13.6 g/dL (ref 13.0–17.7)
Hematocrit: 40.1 % (ref 37.5–51.0)
IMMATURE GRANS (ABS): 0 10*3/uL (ref 0.0–0.1)
IMMATURE GRANULOCYTES: 0 %
LYMPHS: 36 %
Lymphocytes Absolute: 2.1 10*3/uL (ref 0.7–3.1)
MCH: 31.3 pg (ref 26.6–33.0)
MCHC: 33.9 g/dL (ref 31.5–35.7)
MCV: 92 fL (ref 79–97)
MONOCYTES: 12 %
Monocytes Absolute: 0.7 10*3/uL (ref 0.1–0.9)
NEUTROS PCT: 49 %
Neutrophils Absolute: 2.8 10*3/uL (ref 1.4–7.0)
PLATELETS: 246 10*3/uL (ref 150–379)
RBC: 4.34 x10E6/uL (ref 4.14–5.80)
RDW: 13.7 % (ref 12.3–15.4)
WBC: 5.7 10*3/uL (ref 3.4–10.8)

## 2017-04-30 LAB — COMPREHENSIVE METABOLIC PANEL
ALT: 15 IU/L (ref 0–44)
AST: 20 IU/L (ref 0–40)
Albumin/Globulin Ratio: 1.3 (ref 1.2–2.2)
Albumin: 4 g/dL (ref 3.5–4.8)
Alkaline Phosphatase: 107 IU/L (ref 39–117)
BUN/Creatinine Ratio: 9 — ABNORMAL LOW (ref 10–24)
BUN: 10 mg/dL (ref 8–27)
Bilirubin Total: 1.3 mg/dL — ABNORMAL HIGH (ref 0.0–1.2)
CALCIUM: 9.3 mg/dL (ref 8.6–10.2)
CO2: 26 mmol/L (ref 18–29)
Chloride: 104 mmol/L (ref 96–106)
Creatinine, Ser: 1.1 mg/dL (ref 0.76–1.27)
GFR, EST AFRICAN AMERICAN: 76 mL/min/{1.73_m2} (ref >59)
GFR, EST NON AFRICAN AMERICAN: 65 mL/min/{1.73_m2} (ref >59)
Globulin, Total: 3.1 g/dL (ref 1.5–4.5)
Glucose: 95 mg/dL (ref 65–99)
Potassium: 5 mmol/L (ref 3.5–5.2)
Sodium: 141 mmol/L (ref 134–144)
TOTAL PROTEIN: 7.1 g/dL (ref 6.0–8.5)

## 2017-04-30 LAB — LIPID PANEL
CHOL/HDL RATIO: 2.9 ratio (ref 0.0–5.0)
Cholesterol, Total: 94 mg/dL — ABNORMAL LOW (ref 100–199)
HDL: 32 mg/dL — AB (ref >39)
LDL Calculated: 42 mg/dL (ref 0–99)
Triglycerides: 98 mg/dL (ref 0–149)
VLDL CHOLESTEROL CAL: 20 mg/dL (ref 5–40)

## 2017-04-30 LAB — TSH: TSH: 1.04 u[IU]/mL (ref 0.450–4.500)

## 2017-05-05 ENCOUNTER — Other Ambulatory Visit (HOSPITAL_COMMUNITY): Payer: Self-pay | Admitting: Internal Medicine

## 2017-05-05 DIAGNOSIS — I2510 Atherosclerotic heart disease of native coronary artery without angina pectoris: Secondary | ICD-10-CM

## 2017-05-07 ENCOUNTER — Ambulatory Visit (HOSPITAL_COMMUNITY)
Admission: RE | Admit: 2017-05-07 | Discharge: 2017-05-07 | Disposition: A | Discharge status: Home / Self Care | Payer: Medicare Other | Source: Ambulatory Visit | Attending: Internal Medicine | Admitting: Internal Medicine

## 2017-05-07 ENCOUNTER — Encounter (HOSPITAL_COMMUNITY): Payer: Self-pay | Admitting: Internal Medicine

## 2017-05-07 VITALS — BP 134/76 | HR 72 | Wt 197.8 lb

## 2017-05-07 DIAGNOSIS — R001 Bradycardia, unspecified: Secondary | ICD-10-CM | POA: Insufficient documentation

## 2017-05-07 DIAGNOSIS — Z7901 Long term (current) use of anticoagulants: Secondary | ICD-10-CM | POA: Diagnosis not present

## 2017-05-07 DIAGNOSIS — I480 Paroxysmal atrial fibrillation: Secondary | ICD-10-CM

## 2017-05-07 DIAGNOSIS — G4730 Sleep apnea, unspecified: Secondary | ICD-10-CM | POA: Diagnosis not present

## 2017-05-07 DIAGNOSIS — E785 Hyperlipidemia, unspecified: Secondary | ICD-10-CM | POA: Insufficient documentation

## 2017-05-07 DIAGNOSIS — I6523 Occlusion and stenosis of bilateral carotid arteries: Secondary | ICD-10-CM | POA: Diagnosis not present

## 2017-05-07 DIAGNOSIS — I6529 Occlusion and stenosis of unspecified carotid artery: Secondary | ICD-10-CM | POA: Diagnosis not present

## 2017-05-07 DIAGNOSIS — I2583 Coronary atherosclerosis due to lipid rich plaque: Secondary | ICD-10-CM

## 2017-05-07 DIAGNOSIS — Z951 Presence of aortocoronary bypass graft: Secondary | ICD-10-CM | POA: Insufficient documentation

## 2017-05-07 DIAGNOSIS — I2510 Atherosclerotic heart disease of native coronary artery without angina pectoris: Secondary | ICD-10-CM | POA: Insufficient documentation

## 2017-05-07 DIAGNOSIS — Z79899 Other long term (current) drug therapy: Secondary | ICD-10-CM | POA: Insufficient documentation

## 2017-05-07 DIAGNOSIS — I482 Chronic atrial fibrillation: Secondary | ICD-10-CM | POA: Diagnosis not present

## 2017-05-07 DIAGNOSIS — I10 Essential (primary) hypertension: Secondary | ICD-10-CM | POA: Insufficient documentation

## 2017-05-07 DIAGNOSIS — K219 Gastro-esophageal reflux disease without esophagitis: Secondary | ICD-10-CM | POA: Insufficient documentation

## 2017-05-07 NOTE — Progress Notes (Signed)
Cardiology Clinic Note   PCP: Dr Rosanna Randy HPI:  Micheal Wright is a very pleasant 76 year old male with a history of coronary artery disease status post coronary artery bypass grafting in 1998.  Last catheterization was in January 2002 due to an abnormal stress test with 4 mm of ST elevation during exercise.  Cardiac catheterization showed patent grafts.  He has a history of hypertension, PAF, hyperlipidemia, and sleep apnea. Myoview 2/09 EF 61% No ischemia.   Had episode PAF in 12/16 during colonoscopy. Underwent DC-CV. Started Eliquis.   He returns today for f/u visit. Still struggling with hearing and tinnitus. Also complaining of worsening back pain. Used to take 200-400mg  ibuprofen as needed 1-2x/week. No CP. Breathing ok. No bleeding with Eliquis.    Echo 01/2015 EF 60-65%, no AR, Mild MR/TR, PA peak pressure 33 mmHg, GLPSS -21% LA size 3.8cm Carotid US 01/2015 with minimal plaque bilaterally.  Carotid u/s 11/2012  R okL 40-59% (stable).                       3/16: 1-39% bilaterally ABI 12/2009  R 1.0  L 1.1  Lipids checked with 5/18 Total Cholesterol: 94 TG 98 HDL 32 LDL 42   ROS: All systems negative except as listed in HPI, PMH and Problem List.  Past Medical History:  Diagnosis Date  . CAD (coronary artery disease)    s/p CABG 1998; Last cath 2002 in setting of marked ST elevation during stress testing. patent grafts; Echo 8/09 EF 55%. No significant valvular disease.  . Carotid artery disease ([place])    u/s 9/10: R 40-59% L 0-39%  . Erectile dysfunction   . GERD (gastroesophageal reflux disease)   . History of colonic polyps   . Hyperlipidemia   . Hypertension   . Sleep apnea     Current Outpatient Prescriptions  Medication Sig Dispense Refill  . amLODipine (NORVASC) 5 MG tablet TAKE 1 TABLET BY MOUTH DAILY 90 tablet 3  . apixaban (ELIQUIS) 5 MG TABS tablet Take 1 tablet (5 mg total) by mouth 2 (two) times daily. 180 tablet 3  . atorvastatin (LIPITOR) 80 MG tablet TAKE 1  TABLET BY MOUTH DAILY 30 tablet 6  . losartan (COZAAR) 100 MG tablet TAKE 1/2 TABLET BY MOUTH ONCE DAILY 45 tablet 3  . omeprazole (PRILOSEC) 40 MG capsule TAKE ONE CAPSULE BY MOUTH DAILY 30 capsule 8  . ranitidine (ZANTAC) 150 MG tablet Take 1 tablet (150 mg total) by mouth 2 (two) times daily as needed for heartburn. 60 tablet 12  . NITROSTAT 0.4 MG SL tablet DISSOLVE 1 TABLET UNDER THE TONGUE FOR CHEST PAIN. MAY REPEAT EVERY 5MINUTES UP TO 3 DOSES. IF NO RELIEF, CALL 911** (Patient not taking: Reported on 05/07/2017) 25 tablet 3   No current facility-administered medications for this encounter.    Vitals:   05/07/17 1340  BP: 134/76  Pulse: 72  SpO2: 100%  Weight: 197 lb 12 oz (89.7 kg)    PHYSICAL EXAM: General:  Well appearing. No resp difficulty HEENT: normal Neck: supple. no JVD. Carotids 2+ bilat; no bruits. No lymphadenopathy or thryomegaly appreciated. Cor: PMI nondisplaced. Irr Irr No rubs, gallops or murmurs. Lungs: clear Abdomen: soft, nontender, nondistended. No hepatosplenomegaly. No bruits or masses. Good bowel sounds. Extremities: no cyanosis, clubbing, rash, edema Neuro: alert & orientedx3, cranial nerves grossly intact. moves all 4 extremities w/o difficulty. Affect pleasant  ECG: AF 62 bpm. No ST-T wave abnormalities.  ASSESSMENT & PLAN:  1) Chronic a fib.  - Rate controlled. No bleeding with eliquis - CHADS VASC 4 (age =2. CAD, HTN). Will continue Eliquis.  2) CAD, s/p CABG 1998 - No signs and symptoms of ischemia. Myoview 3/16 ok.  3) HTN - Well controlled On losartan and amlodipine.  4) HL - managed by Dr. Rosanna Randy. Lipids look good  5) Bradycardia - Stopped Toprol. Doing well. 6) Carotid stenosis, mild -Due for f/u. Will schedule . Last scan 3/16 1-39% bilaterally.   [name], MD 2:14 PM

## 2017-05-07 NOTE — Patient Instructions (Signed)
Your physician has requested that you have a carotid duplex. This test is an ultrasound of the carotid arteries in your neck. It looks at blood flow through these arteries that supply the brain with blood. Allow one hour for this exam. There are no restrictions or special instructions.  We will contact you in 1 year to schedule your next appointment.

## 2017-05-28 ENCOUNTER — Ambulatory Visit (HOSPITAL_COMMUNITY)
Admission: RE | Admit: 2017-05-28 | Discharge: 2017-05-28 | Disposition: A | Discharge status: Home / Self Care | Payer: Medicare Other | Source: Ambulatory Visit | Attending: Internal Medicine | Admitting: Internal Medicine

## 2017-05-28 DIAGNOSIS — I2510 Atherosclerotic heart disease of native coronary artery without angina pectoris: Secondary | ICD-10-CM | POA: Insufficient documentation

## 2017-05-28 DIAGNOSIS — I10 Essential (primary) hypertension: Secondary | ICD-10-CM | POA: Diagnosis not present

## 2017-05-28 DIAGNOSIS — I6523 Occlusion and stenosis of bilateral carotid arteries: Secondary | ICD-10-CM | POA: Insufficient documentation

## 2017-05-28 DIAGNOSIS — E785 Hyperlipidemia, unspecified: Secondary | ICD-10-CM | POA: Diagnosis not present

## 2017-05-28 DIAGNOSIS — Z951 Presence of aortocoronary bypass graft: Secondary | ICD-10-CM | POA: Diagnosis not present

## 2017-05-28 DIAGNOSIS — I6529 Occlusion and stenosis of unspecified carotid artery: Secondary | ICD-10-CM | POA: Diagnosis not present

## 2017-06-19 DIAGNOSIS — L570 Actinic keratosis: Secondary | ICD-10-CM | POA: Diagnosis not present

## 2017-06-19 DIAGNOSIS — X32XXXA Exposure to sunlight, initial encounter: Secondary | ICD-10-CM | POA: Diagnosis not present

## 2017-06-19 DIAGNOSIS — Z85828 Personal history of other malignant neoplasm of skin: Secondary | ICD-10-CM | POA: Diagnosis not present

## 2017-06-19 DIAGNOSIS — D1801 Hemangioma of skin and subcutaneous tissue: Secondary | ICD-10-CM | POA: Diagnosis not present

## 2017-06-19 DIAGNOSIS — L821 Other seborrheic keratosis: Secondary | ICD-10-CM | POA: Diagnosis not present

## 2017-06-19 DIAGNOSIS — Z08 Encounter for follow-up examination after completed treatment for malignant neoplasm: Secondary | ICD-10-CM | POA: Diagnosis not present

## 2017-06-24 DIAGNOSIS — J301 Allergic rhinitis due to pollen: Secondary | ICD-10-CM | POA: Diagnosis not present

## 2017-06-24 DIAGNOSIS — K219 Gastro-esophageal reflux disease without esophagitis: Secondary | ICD-10-CM | POA: Diagnosis not present

## 2017-06-24 DIAGNOSIS — R070 Pain in throat: Secondary | ICD-10-CM | POA: Diagnosis not present

## 2017-06-24 DIAGNOSIS — H6122 Impacted cerumen, left ear: Secondary | ICD-10-CM | POA: Diagnosis not present

## 2017-06-25 DIAGNOSIS — Z6828 Body mass index (BMI) 28.0-28.9, adult: Secondary | ICD-10-CM | POA: Diagnosis not present

## 2017-06-25 DIAGNOSIS — M545 Low back pain: Secondary | ICD-10-CM | POA: Diagnosis not present

## 2017-06-25 DIAGNOSIS — I10 Essential (primary) hypertension: Secondary | ICD-10-CM | POA: Diagnosis not present

## 2017-06-25 DIAGNOSIS — G8929 Other chronic pain: Secondary | ICD-10-CM | POA: Diagnosis not present

## 2017-07-01 DIAGNOSIS — M545 Low back pain: Secondary | ICD-10-CM | POA: Diagnosis not present

## 2017-07-01 DIAGNOSIS — M25551 Pain in right hip: Secondary | ICD-10-CM | POA: Diagnosis not present

## 2017-07-01 DIAGNOSIS — R293 Abnormal posture: Secondary | ICD-10-CM | POA: Diagnosis not present

## 2017-07-01 DIAGNOSIS — R262 Difficulty in walking, not elsewhere classified: Secondary | ICD-10-CM | POA: Diagnosis not present

## 2017-07-03 DIAGNOSIS — R262 Difficulty in walking, not elsewhere classified: Secondary | ICD-10-CM | POA: Diagnosis not present

## 2017-07-03 DIAGNOSIS — M545 Low back pain: Secondary | ICD-10-CM | POA: Diagnosis not present

## 2017-07-03 DIAGNOSIS — M25551 Pain in right hip: Secondary | ICD-10-CM | POA: Diagnosis not present

## 2017-07-03 DIAGNOSIS — R293 Abnormal posture: Secondary | ICD-10-CM | POA: Diagnosis not present

## 2017-07-04 ENCOUNTER — Other Ambulatory Visit: Payer: Self-pay | Admitting: Family Medicine

## 2017-07-04 DIAGNOSIS — R293 Abnormal posture: Secondary | ICD-10-CM | POA: Diagnosis not present

## 2017-07-04 DIAGNOSIS — R262 Difficulty in walking, not elsewhere classified: Secondary | ICD-10-CM | POA: Diagnosis not present

## 2017-07-04 DIAGNOSIS — M25551 Pain in right hip: Secondary | ICD-10-CM | POA: Diagnosis not present

## 2017-07-04 DIAGNOSIS — M545 Low back pain: Secondary | ICD-10-CM | POA: Diagnosis not present

## 2017-07-08 DIAGNOSIS — R293 Abnormal posture: Secondary | ICD-10-CM | POA: Diagnosis not present

## 2017-07-08 DIAGNOSIS — R262 Difficulty in walking, not elsewhere classified: Secondary | ICD-10-CM | POA: Diagnosis not present

## 2017-07-08 DIAGNOSIS — M25551 Pain in right hip: Secondary | ICD-10-CM | POA: Diagnosis not present

## 2017-07-08 DIAGNOSIS — M545 Low back pain: Secondary | ICD-10-CM | POA: Diagnosis not present

## 2017-07-10 DIAGNOSIS — M545 Low back pain: Secondary | ICD-10-CM | POA: Diagnosis not present

## 2017-07-10 DIAGNOSIS — R262 Difficulty in walking, not elsewhere classified: Secondary | ICD-10-CM | POA: Diagnosis not present

## 2017-07-10 DIAGNOSIS — M25551 Pain in right hip: Secondary | ICD-10-CM | POA: Diagnosis not present

## 2017-07-10 DIAGNOSIS — R293 Abnormal posture: Secondary | ICD-10-CM | POA: Diagnosis not present

## 2017-07-11 DIAGNOSIS — R262 Difficulty in walking, not elsewhere classified: Secondary | ICD-10-CM | POA: Diagnosis not present

## 2017-07-11 DIAGNOSIS — M25551 Pain in right hip: Secondary | ICD-10-CM | POA: Diagnosis not present

## 2017-07-11 DIAGNOSIS — R293 Abnormal posture: Secondary | ICD-10-CM | POA: Diagnosis not present

## 2017-07-11 DIAGNOSIS — M545 Low back pain: Secondary | ICD-10-CM | POA: Diagnosis not present

## 2017-07-14 DIAGNOSIS — H903 Sensorineural hearing loss, bilateral: Secondary | ICD-10-CM | POA: Diagnosis not present

## 2017-07-14 DIAGNOSIS — H9312 Tinnitus, left ear: Secondary | ICD-10-CM | POA: Diagnosis not present

## 2017-07-15 DIAGNOSIS — M545 Low back pain: Secondary | ICD-10-CM | POA: Diagnosis not present

## 2017-07-15 DIAGNOSIS — R293 Abnormal posture: Secondary | ICD-10-CM | POA: Diagnosis not present

## 2017-07-15 DIAGNOSIS — R262 Difficulty in walking, not elsewhere classified: Secondary | ICD-10-CM | POA: Diagnosis not present

## 2017-07-15 DIAGNOSIS — M25551 Pain in right hip: Secondary | ICD-10-CM | POA: Diagnosis not present

## 2017-07-17 DIAGNOSIS — R262 Difficulty in walking, not elsewhere classified: Secondary | ICD-10-CM | POA: Diagnosis not present

## 2017-07-17 DIAGNOSIS — M545 Low back pain: Secondary | ICD-10-CM | POA: Diagnosis not present

## 2017-07-17 DIAGNOSIS — R293 Abnormal posture: Secondary | ICD-10-CM | POA: Diagnosis not present

## 2017-07-17 DIAGNOSIS — M25551 Pain in right hip: Secondary | ICD-10-CM | POA: Diagnosis not present

## 2017-07-18 DIAGNOSIS — M25551 Pain in right hip: Secondary | ICD-10-CM | POA: Diagnosis not present

## 2017-07-18 DIAGNOSIS — M545 Low back pain: Secondary | ICD-10-CM | POA: Diagnosis not present

## 2017-07-18 DIAGNOSIS — R262 Difficulty in walking, not elsewhere classified: Secondary | ICD-10-CM | POA: Diagnosis not present

## 2017-07-18 DIAGNOSIS — R293 Abnormal posture: Secondary | ICD-10-CM | POA: Diagnosis not present

## 2017-07-21 DIAGNOSIS — R293 Abnormal posture: Secondary | ICD-10-CM | POA: Diagnosis not present

## 2017-07-21 DIAGNOSIS — R262 Difficulty in walking, not elsewhere classified: Secondary | ICD-10-CM | POA: Diagnosis not present

## 2017-07-21 DIAGNOSIS — M545 Low back pain: Secondary | ICD-10-CM | POA: Diagnosis not present

## 2017-07-21 DIAGNOSIS — M25551 Pain in right hip: Secondary | ICD-10-CM | POA: Diagnosis not present

## 2017-07-23 ENCOUNTER — Telehealth (HOSPITAL_COMMUNITY): Payer: Self-pay

## 2017-07-23 NOTE — Telephone Encounter (Signed)
Patient calling to ask if ok to take ibuprofen PRN for back pain as tylenol and nothing else helps. Strongly advised to try tylenol and heating pad first, but per Dr. Haroldine Laws and CHF clinical PharmD Tawni Pummel ok to take once daily as needed, without taking more than two days consecutively. Also encouraged to speak to PCP about other pain medication options. Patient aware and agreeable.  Renee Pain, RN

## 2017-08-05 ENCOUNTER — Other Ambulatory Visit (HOSPITAL_COMMUNITY): Payer: Self-pay | Admitting: Internal Medicine

## 2017-08-28 ENCOUNTER — Ambulatory Visit (INDEPENDENT_AMBULATORY_CARE_PROVIDER_SITE_OTHER): Payer: Medicare Other

## 2017-08-28 DIAGNOSIS — Z23 Encounter for immunization: Secondary | ICD-10-CM | POA: Diagnosis not present

## 2017-09-10 DIAGNOSIS — L820 Inflamed seborrheic keratosis: Secondary | ICD-10-CM | POA: Diagnosis not present

## 2017-09-10 DIAGNOSIS — R208 Other disturbances of skin sensation: Secondary | ICD-10-CM | POA: Diagnosis not present

## 2017-09-10 DIAGNOSIS — L538 Other specified erythematous conditions: Secondary | ICD-10-CM | POA: Diagnosis not present

## 2017-12-05 ENCOUNTER — Other Ambulatory Visit (HOSPITAL_COMMUNITY): Payer: Self-pay | Admitting: Internal Medicine

## 2017-12-05 DIAGNOSIS — I2510 Atherosclerotic heart disease of native coronary artery without angina pectoris: Secondary | ICD-10-CM

## 2017-12-19 ENCOUNTER — Other Ambulatory Visit (HOSPITAL_COMMUNITY): Payer: Self-pay | Admitting: *Deleted

## 2017-12-19 DIAGNOSIS — I2510 Atherosclerotic heart disease of native coronary artery without angina pectoris: Secondary | ICD-10-CM

## 2017-12-19 MED ORDER — ATORVASTATIN CALCIUM 80 MG PO TABS: 80.0000 mg | ORAL_TABLET | Freq: Every day | ORAL | 3 refills | Status: DC

## 2017-12-30 ENCOUNTER — Other Ambulatory Visit (HOSPITAL_COMMUNITY): Payer: Self-pay | Admitting: *Deleted

## 2017-12-30 DIAGNOSIS — I2510 Atherosclerotic heart disease of native coronary artery without angina pectoris: Secondary | ICD-10-CM

## 2017-12-30 MED ORDER — ATORVASTATIN CALCIUM 80 MG PO TABS: 80.0000 mg | ORAL_TABLET | Freq: Every day | ORAL | 3 refills | Status: DC

## 2018-01-13 DIAGNOSIS — H903 Sensorineural hearing loss, bilateral: Secondary | ICD-10-CM | POA: Diagnosis not present

## 2018-02-03 DIAGNOSIS — H2513 Age-related nuclear cataract, bilateral: Secondary | ICD-10-CM | POA: Diagnosis not present

## 2018-02-17 ENCOUNTER — Telehealth (HOSPITAL_COMMUNITY): Payer: Self-pay

## 2018-02-17 NOTE — Telephone Encounter (Signed)
Pt doesn't have diagnosis of HF.  Needs repeat Echo and needs labs.  Can give lasix 40 mg to take AS NEEDED.  Would take ONE dose and follow.          Needs appointment to be moved up.    Legrand Como [address] [place], PA-C 02/17/2018 10:30 AM

## 2018-02-17 NOTE — Telephone Encounter (Signed)
Called to tell pt plan unable to reach with no VM. I have scheduled echo and appoint for 03/02/2018 at 1/2 pm.

## 2018-02-17 NOTE — Telephone Encounter (Signed)
Advanced Heart Failure Triage Encounter  Patient Name: Micheal Wright  Date of Call: 02/17/18  Problem:  Pt wife has called b/c pt has pitting edema in bilateral lower extremities. Pt does not weigh everyday "feels like he has had about 4 lbs weight". Denies SOB, CP,  Cough, however does sleep with more pillows at night.  Pt is not on a diuretic and has appt scheduled with Dr. Haroldine Laws in June, 2019.   Plan:    Shirley Muscat, RN

## 2018-02-23 NOTE — Telephone Encounter (Signed)
Called Pt and left VM will try to contact him again.

## 2018-03-02 ENCOUNTER — Other Ambulatory Visit: Payer: Self-pay

## 2018-03-02 ENCOUNTER — Ambulatory Visit (HOSPITAL_BASED_OUTPATIENT_CLINIC_OR_DEPARTMENT_OTHER)
Admission: RE | Admit: 2018-03-02 | Discharge: 2018-03-02 | Disposition: A | Discharge status: Home / Self Care | Payer: Medicare Other | Source: Ambulatory Visit | Attending: Internal Medicine | Admitting: Internal Medicine

## 2018-03-02 ENCOUNTER — Ambulatory Visit (HOSPITAL_COMMUNITY)
Admission: RE | Admit: 2018-03-02 | Discharge: 2018-03-02 | Disposition: A | Discharge status: Home / Self Care | Payer: Medicare Other | Source: Ambulatory Visit | Attending: Internal Medicine | Admitting: Internal Medicine

## 2018-03-02 ENCOUNTER — Encounter (HOSPITAL_COMMUNITY): Payer: Self-pay

## 2018-03-02 VITALS — BP 158/82 | HR 56 | Wt 204.6 lb

## 2018-03-02 DIAGNOSIS — I482 Chronic atrial fibrillation, unspecified: Secondary | ICD-10-CM

## 2018-03-02 DIAGNOSIS — I77810 Thoracic aortic ectasia: Secondary | ICD-10-CM

## 2018-03-02 DIAGNOSIS — I2510 Atherosclerotic heart disease of native coronary artery without angina pectoris: Secondary | ICD-10-CM

## 2018-03-02 DIAGNOSIS — E785 Hyperlipidemia, unspecified: Secondary | ICD-10-CM | POA: Diagnosis not present

## 2018-03-02 DIAGNOSIS — I429 Cardiomyopathy, unspecified: Secondary | ICD-10-CM | POA: Diagnosis not present

## 2018-03-02 DIAGNOSIS — I10 Essential (primary) hypertension: Secondary | ICD-10-CM | POA: Diagnosis not present

## 2018-03-02 DIAGNOSIS — G4730 Sleep apnea, unspecified: Secondary | ICD-10-CM

## 2018-03-02 DIAGNOSIS — I2583 Coronary atherosclerosis due to lipid rich plaque: Secondary | ICD-10-CM | POA: Diagnosis not present

## 2018-03-02 DIAGNOSIS — R5383 Other fatigue: Secondary | ICD-10-CM | POA: Diagnosis not present

## 2018-03-02 DIAGNOSIS — Z951 Presence of aortocoronary bypass graft: Secondary | ICD-10-CM | POA: Diagnosis not present

## 2018-03-02 DIAGNOSIS — I6529 Occlusion and stenosis of unspecified carotid artery: Secondary | ICD-10-CM

## 2018-03-02 DIAGNOSIS — I110 Hypertensive heart disease with heart failure: Secondary | ICD-10-CM | POA: Insufficient documentation

## 2018-03-02 DIAGNOSIS — R600 Localized edema: Secondary | ICD-10-CM

## 2018-03-02 DIAGNOSIS — I4891 Unspecified atrial fibrillation: Secondary | ICD-10-CM | POA: Diagnosis not present

## 2018-03-02 DIAGNOSIS — I509 Heart failure, unspecified: Secondary | ICD-10-CM | POA: Diagnosis not present

## 2018-03-02 DIAGNOSIS — E7800 Pure hypercholesterolemia, unspecified: Secondary | ICD-10-CM

## 2018-03-02 HISTORY — DX: Chronic atrial fibrillation, unspecified: I48.20

## 2018-03-02 HISTORY — DX: Thoracic aortic ectasia: I77.810

## 2018-03-02 LAB — BASIC METABOLIC PANEL
ANION GAP: 7 (ref 5–15)
BUN: 10 mg/dL (ref 6–20)
CHLORIDE: 108 mmol/L (ref 101–111)
CO2: 26 mmol/L (ref 22–32)
Calcium: 9.2 mg/dL (ref 8.9–10.3)
Creatinine, Ser: 0.92 mg/dL (ref 0.61–1.24)
GFR calc non Af Amer: >60 mL/min (ref >60)
GLUCOSE: 78 mg/dL (ref 65–99)
Potassium: 4.4 mmol/L (ref 3.5–5.1)
Sodium: 141 mmol/L (ref 135–145)

## 2018-03-02 LAB — BRAIN NATRIURETIC PEPTIDE: B Natriuretic Peptide: 141.2 pg/mL — ABNORMAL HIGH (ref 0.0–100.0)

## 2018-03-02 MED ORDER — HYDROCHLOROTHIAZIDE 12.5 MG PO CAPS: 12.5000 mg | ORAL_CAPSULE | Freq: Every day | ORAL | 11 refills | Status: DC

## 2018-03-02 NOTE — Progress Notes (Signed)
  Echocardiogram 2D Echocardiogram has been performed.  Darlina Sicilian M 03/02/2018, 1:28 PM

## 2018-03-02 NOTE — Patient Instructions (Addendum)
Routine lab work today. Will notify you of abnormal results, otherwise no news is good news!  START Hydrachlorathiazide (HCTZ) 12.5 mg tablet once daily.  Will schedule you for sleep study at [facility]. Address: [place], [place], [place] [address] Phone: [phone] Their office will call you to schedule.  Will schedule PYP cardiac scan at [facility] in at main entrance (Entrance A) on [address]. Free valet parking available at this location. No prep required for this study.  Follow up 6 weeks with Dr. Haroldine Laws.  Take all medication as prescribed the day of your appointment. Bring all medications with you to your appointment.  Do the following things EVERYDAY: 1) Weigh yourself in the morning before breakfast. Write it down and keep it in a log. 2) Take your medicines as prescribed 3) Eat low salt foods-Limit salt (sodium) to 2000 mg per day.  4) Stay as active as you can everyday 5) Limit all fluids for the day to less than 2 liters

## 2018-03-02 NOTE — Progress Notes (Signed)
Advanced Heart Failure Clinic Note   PCP: Dr Rosanna Randy HPI:  Micheal Wright is a 77 y.o. male with a history of coronary artery disease status post coronary artery bypass grafting in 1998.  Last catheterization was in January 2002 due to an abnormal stress test with 4 mm of ST elevation during exercise.  Cardiac catheterization showed patent grafts.  He has a history of hypertension, PAF, hyperlipidemia, and sleep apnea. Myoview 2/09 EF 61% No ischemia.   Had episode PAF in 12/16 during colonoscopy. Underwent DC-CV. Started Eliquis.   He presents today as add on for swelling. Echo performed prior to visit. He has been overall feeling well. Working in his yard without symptoms. Has had swelling in his legs with pitting on occasion. Also has varicose veins. Denies lightheadedness or dizziness. No bleeding on Eliquis. He has not taken any lasix. He has been diagnosed with sleep apnea, but has never used CPAP. 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 BP has been well controlled. No orthopnea or PND.   Echo today LVEF 55-60% Mild RV dilation. Mild to mod TR. RVSP not elevated on echo. Personally reviewed   Echo 01/2015 EF 60-65%, no AR, Mild MR/TR, PA peak pressure 33 mmHg, GLPSS -21% LA size 3.8cm Carotid US 01/2015 with minimal plaque bilaterally.  Carotid u/s 11/2012  R okL 40-59% (stable).                       3/16: 1-39% bilaterally ABI 12/2009  R 1.0  L 1.1  Lipids checked with 5/18 Total Cholesterol: 94 TG 98 HDL 32 LDL 42   Review of systems complete and found to be negative unless listed in HPI.    Past Medical History:  Diagnosis Date  . CAD (coronary artery disease)    s/p CABG 1998; Last cath 2002 in setting of marked ST elevation during stress testing. patent grafts; Echo 8/09 EF 55%. No significant valvular disease.  . Carotid artery disease ([place])    u/s 9/10: R 40-59% L 0-39%  . Erectile dysfunction   . GERD (gastroesophageal reflux disease)   . History of  colonic polyps   . Hyperlipidemia   . Hypertension   . Sleep apnea     Current Outpatient Medications  Medication Sig Dispense Refill  . amLODipine (NORVASC) 5 MG tablet TAKE 1 TABLET BY MOUTH DAILY 90 tablet 3  . atorvastatin (LIPITOR) 80 MG tablet Take 1 tablet (80 mg total) by mouth daily. 30 tablet 3  . ELIQUIS 5 MG TABS tablet TAKE 1 TABLET BY MOUTH TWICE (2) DAILY 180 tablet 3  . losartan (COZAAR) 100 MG tablet TAKE 1/2 TABLET BY MOUTH ONCE DAILY 45 tablet 3  . omeprazole (PRILOSEC) 40 MG capsule TAKE ONE CAPSULE BY MOUTH DAILY 30 capsule 12  . ranitidine (ZANTAC) 150 MG tablet Take 1 tablet (150 mg total) by mouth 2 (two) times daily as needed for heartburn. 60 tablet 12  . NITROSTAT 0.4 MG SL tablet DISSOLVE 1 TABLET UNDER THE TONGUE FOR CHEST PAIN. MAY REPEAT EVERY 5MINUTES UP TO 3 DOSES. IF NO RELIEF, CALL 911** (Patient not taking: Reported on 05/07/2017) 25 tablet 3   No current facility-administered medications for this encounter.    Vitals:   03/02/18 1422  BP: (!) 158/82  Pulse: (!) 56  SpO2: 98%  Weight: 204 lb 9.6 oz (92.8 kg)   Wt Readings from Last 3 Encounters:  03/02/18 204 lb  9.6 oz (92.8 kg)  05/07/17 197 lb 12 oz (89.7 kg)  04/23/17 200 lb (90.7 kg)    PHYSICAL EXAM: General: Well appearing. No resp difficulty. HEENT: Normal anicteric Neck: Supple. JVP ~6. Carotids 2+ bilat; no bruits. No thyromegaly or nodule noted. Cor: PMI nondisplaced. Irregularly, irregular. No M/G/R noted Lungs: CTAB, normal effort. No wheeze Abdomen: Soft, non-tender, non-distended, no HSM. No bruits or masses. +BS  Extremities: No cyanosis, clubbing, or rash. Trace-1+ ankle edema. + venous varicosities. Neuro: alert & oriented x 3, cranial nerves grossly intact. moves all 4 extremities w/o difficulty. Affect pleasant  ECG: AF 60 bpms, no ST abnormalities, personally reviewed.   ASSESSMENT & PLAN:  1) LE edema - this is mild  - likely mixed picture between dependent edema  (with LE varicosities) but also has some RV strain on echo - unable to use spiro due to hyperkalemia. Will start HCTZ 12.5 for HTN and edema - check sleeps study and PYP scan  - TED hose as needed - Can consider RHC if needed 2) Chronic a fib.  - Rate controlled. Denies bleeding on Eliquis.  - CHADS VASC 4 (age =2. CAD, HTN).  3) CAD, s/p CABG 1998 - No s/s of ischemia.    - Myoview 3/16 OK.  4) HTN - Elevated.  - Continue losartan 50 mg daily - Continue amlodipine 5 mg daily. - Will add HCTZ 12.5 mg daily - Echo today with LVEF 55-60%, and mild RV dysfunction. Personally reviewed by Dr. Haroldine Laws  - Elevated R sided pressures. Will check Tc-PYP scan to r/o amyloid.  5) HL - Managed by Dr. Rosanna Randy.  6) Bradycardia - BB previously stopped. Stable.  7) Carotid stenosis, mild - US Carotid 05/28/2017 Bilateral 1-39%. Will follow up prn.  8) Sleep Apnea - Relatively heavy snoring per his wife.  - Previous diagnosis in chart - Will send for repeat sleep study.  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 Follow.   Labs, PYP, and Sleep study as above. Add low dose HTCZ. Continue to check BP at home. Goal 120-130  [name], [place]  2:41 PM   77 y/o male with HTN, chronic AF, CAD s/p CABG presents for add-on visit due to LE edema. I have reviewed echo personally. LVEF normal with mild RV strain on echo. Suspect edema likely mixed picture between dependent edema (with LE varicosities) but also has some RV strain on echo. Start HCTZ. Check PYP scan and sleeps study. Low threshold for RHC. CAD is stable.   [name], MD  2:32 PM

## 2018-03-19 ENCOUNTER — Ambulatory Visit (HOSPITAL_BASED_OUTPATIENT_CLINIC_OR_DEPARTMENT_OTHER): Payer: Medicare Other | Attending: Student | Admitting: Cardiology

## 2018-03-19 VITALS — Ht 70.0 in | Wt 200.0 lb

## 2018-03-19 DIAGNOSIS — D2271 Melanocytic nevi of right lower limb, including hip: Secondary | ICD-10-CM | POA: Diagnosis not present

## 2018-03-19 DIAGNOSIS — Z08 Encounter for follow-up examination after completed treatment for malignant neoplasm: Secondary | ICD-10-CM | POA: Diagnosis not present

## 2018-03-19 DIAGNOSIS — X32XXXA Exposure to sunlight, initial encounter: Secondary | ICD-10-CM | POA: Diagnosis not present

## 2018-03-19 DIAGNOSIS — G4733 Obstructive sleep apnea (adult) (pediatric): Secondary | ICD-10-CM

## 2018-03-19 DIAGNOSIS — D2272 Melanocytic nevi of left lower limb, including hip: Secondary | ICD-10-CM | POA: Diagnosis not present

## 2018-03-19 DIAGNOSIS — Z85828 Personal history of other malignant neoplasm of skin: Secondary | ICD-10-CM | POA: Diagnosis not present

## 2018-03-19 DIAGNOSIS — L728 Other follicular cysts of the skin and subcutaneous tissue: Secondary | ICD-10-CM | POA: Diagnosis not present

## 2018-03-19 DIAGNOSIS — R5383 Other fatigue: Secondary | ICD-10-CM

## 2018-03-19 DIAGNOSIS — D485 Neoplasm of uncertain behavior of skin: Secondary | ICD-10-CM | POA: Diagnosis not present

## 2018-03-19 DIAGNOSIS — D2261 Melanocytic nevi of right upper limb, including shoulder: Secondary | ICD-10-CM | POA: Diagnosis not present

## 2018-03-19 DIAGNOSIS — L821 Other seborrheic keratosis: Secondary | ICD-10-CM | POA: Diagnosis not present

## 2018-03-19 DIAGNOSIS — D2262 Melanocytic nevi of left upper limb, including shoulder: Secondary | ICD-10-CM | POA: Diagnosis not present

## 2018-03-19 DIAGNOSIS — D225 Melanocytic nevi of trunk: Secondary | ICD-10-CM | POA: Diagnosis not present

## 2018-03-19 DIAGNOSIS — L570 Actinic keratosis: Secondary | ICD-10-CM | POA: Diagnosis not present

## 2018-03-19 DIAGNOSIS — C44519 Basal cell carcinoma of skin of other part of trunk: Secondary | ICD-10-CM | POA: Diagnosis not present

## 2018-03-21 NOTE — Procedures (Signed)
   NAME: Micheal Wright DATE OF BIRTH:  1941/03/10 MEDICAL RECORD NUMBER [id number]  LOCATION: [facility]  PHYSICIAN: Consuelo Thayne  DATE OF STUDY: 03/19/2018  SLEEP STUDY TYPE: Nocturnal Polysomnogram               REFERRING PHYSICIAN: [name]*   Gender: Male D.O.B: December 26, 1940 Age (years): 76 Referring Provider: Landry Corporal Height (inches): 75 Interpreting Physician: Fransico Him MD, ABSM Weight (lbs): 200 RPSGT: Laren Everts BMI: 29 MRN: [id number] Neck Size: 17.00  CLINICAL INFORMATION Sleep Study Type: NPSG  Indication for sleep study: Fatigue, Hypertension, OSA, Sleep walking/talking/parasomnias, Snoring, Witnessed Apneas  Epworth Sleepiness Score: 10  SLEEP STUDY TECHNIQUE As per the AASM Manual for the Scoring of Sleep and Associated Events v2.3 (April 2016) with a hypopnea requiring 4% desaturations.  The channels recorded and monitored were frontal, central and occipital EEG, electrooculogram (EOG), submentalis EMG (chin), nasal and oral airflow, thoracic and abdominal wall motion, anterior tibialis EMG, snore microphone, electrocardiogram, and pulse oximetry.  MEDICATIONS Medications self-administered by patient taken the night of the study : N/A  SLEEP ARCHITECTURE The study was initiated at 11:01:19 PM and ended at 5:16:11 AM.  Sleep onset time was 15.9 minutes and the sleep efficiency was 73.5%%. The total sleep time was 275.5 minutes.  Stage REM latency was 71.0 minutes.  The patient spent 10.0%% of the night in stage N1 sleep, 77.0%% in stage N2 sleep, 0.0%% in stage N3 and 13.07% in REM.  Alpha intrusion was absent.  Supine sleep was 0.00%.  RESPIRATORY PARAMETERS The overall apnea/hypopnea index (AHI) was 6.1 per hour. There were 9 total apneas, including 8 obstructive, 1 central and 0 mixed apneas. There were 19 hypopneas and 90 RERAs. The RDI was elevated at 25.7.  The AHI during Stage REM sleep was 11.7  per hour.  AHI while supine was N/A per hour.  The mean oxygen saturation was 95.9%. The minimum SpO2 during sleep was 87.0%.  moderate snoring was noted during this study.  CARDIAC DATA The 2 lead EKG demonstrated atrial fibrillation. The mean heart rate was 51.6 beats per minute. Other EKG findings include: PVCs.  LEG MOVEMENT DATA The total PLMS were 0 with a resulting PLMS index of 0.0. Associated arousal with leg movement index was 0.4 .  IMPRESSIONS - Mild obstructive sleep apnea occurred during this study (AHI = 6.1/h). - No significant central sleep apnea occurred during this study (CAI = 0.2/h). - Mild oxygen desaturation was noted during this study (Min O2 = 87.0%). - The patient snored with moderate snoring volume. - EKG findings include PVCs. - Clinically significant periodic limb movements did not occur during sleep. No significant associated arousals.  DIAGNOSIS - Obstructive Sleep Apnea (327.23 [G47.33 ICD-10])  RECOMMENDATIONS - Given history of atrial fibrillation and elevated epworth sleepiness score with mild sleep apnea, recommend CPAP titration. - Avoid alcohol, sedatives and other CNS depressants that may worsen sleep apnea and disrupt normal sleep architecture. - Sleep hygiene should be reviewed to assess factors that may improve sleep quality. - Weight management and regular exercise should be initiated or continued if appropriate.  [place], American Board of Sleep Medicine  ELECTRONICALLY SIGNED ON:  03/21/2018, 6:26 PM [place] PH: [phone]   FX: [phone] [place]

## 2018-03-23 ENCOUNTER — Other Ambulatory Visit (HOSPITAL_BASED_OUTPATIENT_CLINIC_OR_DEPARTMENT_OTHER): Payer: Self-pay

## 2018-03-23 ENCOUNTER — Other Ambulatory Visit: Payer: Self-pay | Admitting: Cardiology

## 2018-03-23 ENCOUNTER — Telehealth: Payer: Self-pay | Admitting: *Deleted

## 2018-03-23 DIAGNOSIS — I10 Essential (primary) hypertension: Secondary | ICD-10-CM

## 2018-03-23 DIAGNOSIS — R5383 Other fatigue: Secondary | ICD-10-CM

## 2018-03-23 DIAGNOSIS — G4733 Obstructive sleep apnea (adult) (pediatric): Secondary | ICD-10-CM

## 2018-03-23 DIAGNOSIS — I482 Chronic atrial fibrillation, unspecified: Secondary | ICD-10-CM

## 2018-03-23 NOTE — Telephone Encounter (Signed)
Patient informed PA request has been submitted to his insurance company for the CPAP titration. Once we hear back from them he will be contacted with an appointment. CPAP order placed into Epic per  Dr Radford Pax.

## 2018-03-23 NOTE — Telephone Encounter (Signed)
I discussed the results with Micheal Wright and explained that he has mild OSA but does drop his O2 sats down to 87% and this could be triggering atrial fibrillation.  Given his elevated epworth sleepiness score, history of severe snoring and PAF I recommend proceeding with CPAP titration.  He is in agreement to proceed.  Please order in lab CPAP titration using a ResMed Airfit P20 mask

## 2018-03-23 NOTE — Telephone Encounter (Signed)
Called to give patient sleep study results and recommendations. He expressed verbal understanding of the information communicated to him, however he wants the MD to tell him "on a scale from 1-10 how bad is it?' He wants to decide based on the severity of his OSA whether he wants to use a CPAP device. I explained to the patient that not treating sleep apnea is not good on any level. It can can lead to other health issues, or it can make any current issues worse if not treated. He again voiced verbal understanding and would like for me to send the MD a note before he agrees to having a titration study. Message will be sent to Dr Radford Pax for review and further recommendations.

## 2018-03-24 ENCOUNTER — Telehealth: Payer: Self-pay | Admitting: *Deleted

## 2018-03-24 NOTE — Telephone Encounter (Signed)
Faxed PA request for upcoming planned sleep study to [facility].

## 2018-03-25 ENCOUNTER — Encounter (HOSPITAL_COMMUNITY)
Admission: RE | Admit: 2018-03-25 | Discharge: 2018-03-25 | Disposition: A | Discharge status: Home / Self Care | Payer: Medicare Other | Source: Ambulatory Visit | Attending: Student | Admitting: Student

## 2018-03-25 ENCOUNTER — Encounter (HOSPITAL_COMMUNITY): Payer: Medicare Other

## 2018-03-25 DIAGNOSIS — I509 Heart failure, unspecified: Secondary | ICD-10-CM | POA: Diagnosis not present

## 2018-03-25 DIAGNOSIS — I429 Cardiomyopathy, unspecified: Secondary | ICD-10-CM | POA: Diagnosis not present

## 2018-03-25 MED ORDER — TECHNETIUM TC 99M PYROPHOSPHATE
20.0000 | Freq: Once | INTRAVENOUS | Status: AC
  Administered 2018-03-25: 20 via INTRAVENOUS
  Filled 2018-03-25: qty 20

## 2018-03-31 ENCOUNTER — Telehealth (HOSPITAL_COMMUNITY): Payer: Self-pay | Admitting: *Deleted

## 2018-03-31 DIAGNOSIS — I429 Cardiomyopathy, unspecified: Secondary | ICD-10-CM

## 2018-03-31 NOTE — Telephone Encounter (Signed)
Result Notes for NM Tumor Localization W Spect   Notes recorded by Darron Doom, RN on 03/31/2018 at 1:14 PM EDT I have called and spoken with patient, he is agreeable with the cMRI for further testing. Order placed and sent to Adolm Joseph for Reserve approval. ------  Notes recorded by [name], PA-C on 03/27/2018 at 11:33 AM EDT Per Dr. Haroldine Laws will need cMRI. Please let him know that his PYP scan was "suggestive" of the protein we talked about building up in his heart, but will need cMRI to further evaluate. (The PYP was to test for the protein, cMRI will look at the individual walls or his heart for strength and determine if any damage has occurred from storage of the protein or otherwise)   Micheal Wright "[employer], PA-C 03/27/2018 11:33 AM

## 2018-03-31 NOTE — Telephone Encounter (Signed)
Message sent to [facility] to schedule. No pre cert reqd.

## 2018-04-02 ENCOUNTER — Encounter: Payer: Self-pay | Admitting: Student

## 2018-04-04 ENCOUNTER — Other Ambulatory Visit: Payer: Self-pay | Admitting: Cardiology

## 2018-04-07 ENCOUNTER — Other Ambulatory Visit (HOSPITAL_COMMUNITY): Payer: Self-pay

## 2018-04-07 ENCOUNTER — Other Ambulatory Visit: Payer: Self-pay | Admitting: Cardiology

## 2018-04-07 MED ORDER — LOSARTAN POTASSIUM 100 MG PO TABS: 50.0000 mg | ORAL_TABLET | Freq: Every day | ORAL | 3 refills | Status: DC

## 2018-04-14 ENCOUNTER — Ambulatory Visit (HOSPITAL_BASED_OUTPATIENT_CLINIC_OR_DEPARTMENT_OTHER): Payer: Medicare Other | Attending: Cardiology | Admitting: Cardiology

## 2018-04-14 VITALS — Ht 70.0 in | Wt 200.0 lb

## 2018-04-14 DIAGNOSIS — G4733 Obstructive sleep apnea (adult) (pediatric): Secondary | ICD-10-CM | POA: Insufficient documentation

## 2018-04-14 DIAGNOSIS — I482 Chronic atrial fibrillation, unspecified: Secondary | ICD-10-CM

## 2018-04-14 DIAGNOSIS — I4891 Unspecified atrial fibrillation: Secondary | ICD-10-CM | POA: Insufficient documentation

## 2018-04-14 DIAGNOSIS — I10 Essential (primary) hypertension: Secondary | ICD-10-CM

## 2018-04-15 NOTE — Procedures (Signed)
   Patient Name: Micheal Wright, Micheal Wright Date: 04/14/2018 Gender: Male D.O.B: [birth year] Age (years): 29 Referring Provider: Fransico Him MD, ABSM Height (inches): 70 Interpreting Physician: Fransico Him MD, ABSM Weight (lbs): 200 RPSGT: Laren Everts BMI: 29 MRN: [id number] Neck Size: 17.00  CLINICAL INFORMATION The patient is referred for a CPAP titration to treat sleep apnea.???????  SLEEP STUDY TECHNIQUE As per the AASM Manual for the Scoring of Sleep and Associated Events v2.3 (April 2016) with a hypopnea requiring 4% desaturations.  The channels recorded and monitored were frontal, central and occipital EEG, electrooculogram (EOG), submentalis EMG (chin), nasal and oral airflow, thoracic and abdominal wall motion, anterior tibialis EMG, snore microphone, electrocardiogram, and pulse oximetry. Continuous positive airway pressure (CPAP) was initiated at the beginning of the study and titrated to treat sleep-disordered breathing.  MEDICATIONS Medications self-administered by patient taken the night of the study : N/A  TECHNICIAN COMMENTS Comments added by technician: NONE Comments added by scorer: N/A  RESPIRATORY PARAMETERS Optimal PAP Pressure (cm): 5  AHI at Optimal Pressure (/hr):0.0 Overall Minimal O2 (%):82.0  Supine % at Optimal Pressure (%):2 Minimal O2 at Optimal Pressure (%): 82.0   SLEEP ARCHITECTURE The study was initiated at 10:04:47 PM and ended at 4:44:02 AM.  Sleep onset time was 12.0 minutes and the sleep efficiency was 60.2%%. The total sleep time was 240.5 minutes.  The patient spent 8.1%% of the night in stage N1 sleep, 82.7%% in stage N2 sleep, 0.0%% in stage N3 and 9.15% in REM.Stage REM latency was 103.0 minutes  Wake after sleep onset was 146.8. Alpha intrusion was absent. Supine sleep was 1.50%.  CARDIAC DATA The 2 lead EKG demonstrated atrial fibrillation. The mean heart rate was 62.7 beats per minute. Other EKG findings include: PVCs.  LEG  MOVEMENT DATA The total Periodic Limb Movements of Sleep (PLMS) were 0. The PLMS index was 0.0. A PLMS index of <15 is considered normal in adults.  IMPRESSIONS - The optimal PAP pressure was 5 cm of water. - Central sleep apnea was not noted during this titration (CAI = 0.0/h). - Moderate oxygen desaturations were observed during this titration (min O2 = 82.0%). - No snoring was audible during this study. - 2-lead EKG demonstrated: PVCs and atrial fibrillation - Clinically significant periodic limb movements were not noted during this study. Arousals associated with PLMs were rare.  DIAGNOSIS - Obstructive Sleep Apnea (327.23 [G47.33 ICD-10]) - Atrial Fibrillation  RECOMMENDATIONS - Trial of CPAP therapy on 5 cm H2O with a Medium size Resmed Nasal Pillow Mask AirFit P10 mask and heated humidification. - Avoid alcohol, sedatives and other CNS depressants that may worsen sleep apnea and disrupt normal sleep architecture. - Sleep hygiene should be reviewed to assess factors that may improve sleep quality. - Weight management and regular exercise should be initiated or continued. - Return to Sleep Center for re-evaluation after 10 weeks of therapy  [Electronically signed] 04/15/2018 09:47 PM  Fransico Him MD, ABSM Diplomate, American Board of Sleep Medicine

## 2018-04-16 ENCOUNTER — Encounter (HOSPITAL_COMMUNITY): Payer: Self-pay | Admitting: Internal Medicine

## 2018-04-16 ENCOUNTER — Ambulatory Visit (HOSPITAL_COMMUNITY)
Admission: RE | Admit: 2018-04-16 | Discharge: 2018-04-16 | Disposition: A | Discharge status: Home / Self Care | Payer: Medicare Other | Source: Ambulatory Visit | Attending: Internal Medicine | Admitting: Internal Medicine

## 2018-04-16 VITALS — BP 120/64 | HR 83 | Wt 198.4 lb

## 2018-04-16 DIAGNOSIS — I2510 Atherosclerotic heart disease of native coronary artery without angina pectoris: Secondary | ICD-10-CM | POA: Diagnosis not present

## 2018-04-16 DIAGNOSIS — Z7901 Long term (current) use of anticoagulants: Secondary | ICD-10-CM | POA: Diagnosis not present

## 2018-04-16 DIAGNOSIS — K219 Gastro-esophageal reflux disease without esophagitis: Secondary | ICD-10-CM | POA: Insufficient documentation

## 2018-04-16 DIAGNOSIS — Z86010 Personal history of colonic polyps: Secondary | ICD-10-CM | POA: Diagnosis not present

## 2018-04-16 DIAGNOSIS — Z79899 Other long term (current) drug therapy: Secondary | ICD-10-CM | POA: Diagnosis not present

## 2018-04-16 DIAGNOSIS — R001 Bradycardia, unspecified: Secondary | ICD-10-CM | POA: Diagnosis not present

## 2018-04-16 DIAGNOSIS — I482 Chronic atrial fibrillation, unspecified: Secondary | ICD-10-CM

## 2018-04-16 DIAGNOSIS — R600 Localized edema: Secondary | ICD-10-CM | POA: Diagnosis not present

## 2018-04-16 DIAGNOSIS — I8390 Asymptomatic varicose veins of unspecified lower extremity: Secondary | ICD-10-CM | POA: Insufficient documentation

## 2018-04-16 DIAGNOSIS — G4730 Sleep apnea, unspecified: Secondary | ICD-10-CM | POA: Insufficient documentation

## 2018-04-16 DIAGNOSIS — E785 Hyperlipidemia, unspecified: Secondary | ICD-10-CM | POA: Insufficient documentation

## 2018-04-16 DIAGNOSIS — Z951 Presence of aortocoronary bypass graft: Secondary | ICD-10-CM | POA: Diagnosis not present

## 2018-04-16 DIAGNOSIS — I6523 Occlusion and stenosis of bilateral carotid arteries: Secondary | ICD-10-CM | POA: Insufficient documentation

## 2018-04-16 DIAGNOSIS — N529 Male erectile dysfunction, unspecified: Secondary | ICD-10-CM | POA: Insufficient documentation

## 2018-04-16 DIAGNOSIS — I10 Essential (primary) hypertension: Secondary | ICD-10-CM | POA: Insufficient documentation

## 2018-04-16 LAB — BASIC METABOLIC PANEL
Anion gap: 7 (ref 5–15)
BUN: 10 mg/dL (ref 6–20)
CO2: 27 mmol/L (ref 22–32)
CREATININE: 1.14 mg/dL (ref 0.61–1.24)
Calcium: 9.4 mg/dL (ref 8.9–10.3)
Chloride: 104 mmol/L (ref 101–111)
GFR calc Af Amer: >60 mL/min (ref >60)
GFR calc non Af Amer: >60 mL/min (ref >60)
GLUCOSE: 91 mg/dL (ref 65–99)
Potassium: 4.8 mmol/L (ref 3.5–5.1)
SODIUM: 138 mmol/L (ref 135–145)

## 2018-04-16 NOTE — Progress Notes (Signed)
Advanced Heart Failure Clinic Note   PCP: Dr Rosanna Randy HPI:  Micheal Wright is a 77 y.o. male with a history of coronary artery disease status post coronary artery bypass grafting in 1998.  Last catheterization was in January 2002 due to an abnormal stress test with 4 mm of ST elevation during exercise.  Cardiac catheterization showed patent grafts.  He has a history of hypertension, PAF, hyperlipidemia, and sleep apnea. Myoview 2/09 EF 61% No ischemia.   Had episode PAF in 12/16 during colonoscopy. Underwent DC-CV. Started Eliquis.   Today he returns for HF follow up. Last visit he was started on HCTZ. Overall feeling fine. Denies SOB/PND/Orthopnea. Leg edema has improved. Appetite ok. No fever or chills. Weight at home 195-200 pounds. Taking all medications.  Sleep Study  04/03/2018 . CPAP Titration completed 04/14/2018   PYP 03/26/2018   Suggestive of Amyloid Anterior planar imaging demonstrates radiotracer uptake within the heart greater than uptake within the adjacent ribs (Grade 2). Quantitative assessment : Quantitative assessment of the cardiac uptake compared to the contralateral chest wall is equal to (H/CL = 1.14). SPECT assessment: SPECT imaging of the chest demonstrates clear radiotracer accumulation within the LEFT ventricle. 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  Echo 03/02/2018  LVEF 55-60% Mild RV dilation. Mild to mod TR. RVSP not elevated on echo. Personally reviewed  Echo 01/2015 EF 60-65%, no AR, Mild MR/TR, PA peak pressure 33 mmHg, GLPSS -21% LA size 3.8cm Carotid US 01/2015 with minimal plaque bilaterally.  Carotid u/s 11/2012  R ok 40-59% (stable).                       3/16: 1-39% bilaterally ABI 12/2009  R 1.0  L 1.1  Lipids checked with 5/18 Total Cholesterol: 94 TG 98 HDL 32 LDL 42   Review of systems complete and found to be negative unless listed in HPI.    Past Medical History:    Diagnosis Date  . CAD (coronary artery disease)    s/p CABG 1998; Last cath 2002 in setting of marked ST elevation during stress testing. patent grafts; Echo 8/09 EF 55%. No significant valvular disease.  . Carotid artery disease ([place])    u/s 9/10: R 40-59% L 0-39%  . Erectile dysfunction   . GERD (gastroesophageal reflux disease)   . History of colonic polyps   . Hyperlipidemia   . Hypertension   . Sleep apnea     Current Outpatient Medications  Medication Sig Dispense Refill  . amLODipine (NORVASC) 5 MG tablet TAKE 1 TABLET BY MOUTH ONCE A DAY 30 tablet 2  . atorvastatin (LIPITOR) 80 MG tablet Take 1 tablet (80 mg total) by mouth daily. 30 tablet 3  . ELIQUIS 5 MG TABS tablet TAKE 1 TABLET BY MOUTH TWICE (2) DAILY 180 tablet 3  . hydrochlorothiazide (MICROZIDE) 12.5 MG capsule Take 1 capsule (12.5 mg total) by mouth daily. 30 capsule 11  . losartan (COZAAR) 50 MG tablet TAKE 1 TABLET BY MOUTH ONCE A DAY 30 tablet 3  . omeprazole (PRILOSEC) 40 MG capsule TAKE ONE CAPSULE BY MOUTH DAILY 30 capsule 12  . ranitidine (ZANTAC) 150 MG tablet Take 1 tablet (150 mg total) by mouth 2 (two) times daily as needed for heartburn. 60 tablet 12  . NITROSTAT 0.4 MG SL tablet DISSOLVE 1 TABLET UNDER THE TONGUE FOR CHEST PAIN. MAY REPEAT EVERY 5MINUTES UP TO 3 DOSES. IF NO RELIEF,  CALL 911** (Patient not taking: Reported on 05/07/2017) 25 tablet 3   No current facility-administered medications for this encounter.    Vitals:   04/16/18 1434  BP: 120/64  Pulse: 83  SpO2: 97%  Weight: 198 lb 6.4 oz (90 kg)   Wt Readings from Last 3 Encounters:  04/16/18 198 lb 6.4 oz (90 kg)  04/14/18 200 lb (90.7 kg)  03/19/18 200 lb (90.7 kg)    PHYSICAL EXAM: General:  Well appearing. No resp difficulty HEENT: normal Neck: supple. no JVD. Carotids 2+ bilat; no bruits. No lymphadenopathy or thryomegaly appreciated. Cor: PMI nondisplaced. irregular rate & rhythm. No rubs, gallops or murmurs. Lungs:  clear Abdomen: soft, nontender, nondistended. No hepatosplenomegaly. No bruits or masses. Good bowel sounds. Extremities: no cyanosis, clubbing, rash, edema + varicosities  Neuro: alert & orientedx3, cranial nerves grossly intact. moves all 4 extremities w/o difficulty. Affect pleasant  ASSESSMENT & PLAN:  1) LE edema - Resolved with addition of HCTZ - likely mixed picture between dependent edema (with LE varicosities) but also has some RV strain on echo - unable to use spiro due to hyperkalemia.   PYP suggestive of amyloid Grade 2. Quanitive 1.14  CMRI tomorrow.  2) Chronic a fib.  - Rate controlled. Denies bleeding on Eliquis.  - CHADS VASC 4 (age =2. CAD, HTN).  3) CAD, s/p CABG 1998 - No s/s of ischemia.    - Myoview 3/16 OK.  4) HTN - Improved with HCTZ - Continue losartan 50 mg daily - Continue amlodipine 5 mg daily. -  Echo 03/02/2018 LVEF 55-60%, and mild RV dysfunction.  5) HL - Managed by Dr. Rosanna Randy.  6) Bradycardia - BB previously stopped. Stable.  7) Carotid stenosis, mild - US Carotid 05/28/2017 Bilateral 1-39%. Will follow up prn.  8) Sleep Apnea - Relatively heavy snoring per his wife.  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 Follow.   Plan for CMRI 04/17/2018   Follow up 3 months.  Darrick Grinder, NP  2:39 PM  Patient seen and examined with Darrick Grinder, NP. We discussed all aspects of the encounter. I agree with the assessment and plan as stated above.   BP now normalized with addition of HCTZ. Edema improved as well. Will get BMET. Discussed results of PYP scan. Quantitative score is low (1.14) but visual score read as grade 2. Will get cMRI to more definitively assess for cardiac amyloid. My overall suspicion is low. CAD stable. Without angina.  [name], MD  3:06 PM

## 2018-04-16 NOTE — Patient Instructions (Signed)
Lab today  We will contact you in 6 months to schedule your next appointment.  

## 2018-04-17 ENCOUNTER — Telehealth: Payer: Self-pay | Admitting: *Deleted

## 2018-04-17 ENCOUNTER — Ambulatory Visit (HOSPITAL_COMMUNITY)
Admission: RE | Admit: 2018-04-17 | Discharge: 2018-04-17 | Disposition: A | Discharge status: Home / Self Care | Payer: Medicare Other | Source: Ambulatory Visit | Attending: Student | Admitting: Student

## 2018-04-17 DIAGNOSIS — I429 Cardiomyopathy, unspecified: Secondary | ICD-10-CM

## 2018-04-17 DIAGNOSIS — I10 Essential (primary) hypertension: Secondary | ICD-10-CM | POA: Diagnosis not present

## 2018-04-17 DIAGNOSIS — R600 Localized edema: Secondary | ICD-10-CM | POA: Diagnosis not present

## 2018-04-17 DIAGNOSIS — Z951 Presence of aortocoronary bypass graft: Secondary | ICD-10-CM | POA: Diagnosis not present

## 2018-04-17 DIAGNOSIS — R001 Bradycardia, unspecified: Secondary | ICD-10-CM | POA: Diagnosis not present

## 2018-04-17 DIAGNOSIS — I482 Chronic atrial fibrillation: Secondary | ICD-10-CM | POA: Diagnosis not present

## 2018-04-17 DIAGNOSIS — E859 Amyloidosis, unspecified: Secondary | ICD-10-CM | POA: Diagnosis not present

## 2018-04-17 DIAGNOSIS — I2510 Atherosclerotic heart disease of native coronary artery without angina pectoris: Secondary | ICD-10-CM | POA: Diagnosis not present

## 2018-04-17 MED ORDER — GADOBENATE DIMEGLUMINE 529 MG/ML IV SOLN
30.0000 mL | Freq: Once | INTRAVENOUS | Status: AC | PRN
  Administered 2018-04-17: 30 mL via INTRAVENOUS

## 2018-04-17 NOTE — Telephone Encounter (Signed)
Pt is aware and agreeable to CPAP TITRATION results At this time patient declines cpap. He is aware the order is good for a year. He states if Dr Micheal Wright feels that it is pertinent for his health then he is agreeable to cpap therapy.

## 2018-04-17 NOTE — Telephone Encounter (Signed)
-----   Message from Sueanne Margarita, MD sent at 04/15/2018  9:49 PM EDT ----- Please let patient know that they had a successful PAP titration and let DME know that orders are in EPIC.  Please set up 10 week OV with me.

## 2018-04-17 NOTE — Telephone Encounter (Signed)
Calling him now.

## 2018-04-17 NOTE — Telephone Encounter (Signed)
Micheal Wright, can you talk to Micheal Wright.  He will only have PAP titration if you tell him he needs it

## 2018-04-20 NOTE — Telephone Encounter (Signed)
All completed

## 2018-04-22 DIAGNOSIS — C44519 Basal cell carcinoma of skin of other part of trunk: Secondary | ICD-10-CM | POA: Diagnosis not present

## 2018-04-24 NOTE — Telephone Encounter (Signed)
Upon patient request DME selection is [facility]. Patient understands he will be contacted by [facility] to set up his cpap. Patient understands to call if [facility] does not contact HIM with new setup in a timely manner. Patient understands they will be called once confirmation has been received from [facility] that they have received their new machine to schedule 10 week follow up appointment.  Shannon notified of new cpap order  Please add to airview Patient was grateful for the call and thanked me.

## 2018-04-29 ENCOUNTER — Ambulatory Visit (INDEPENDENT_AMBULATORY_CARE_PROVIDER_SITE_OTHER): Payer: Medicare Other | Admitting: Family Medicine

## 2018-04-29 ENCOUNTER — Ambulatory Visit (INDEPENDENT_AMBULATORY_CARE_PROVIDER_SITE_OTHER): Payer: Medicare Other

## 2018-04-29 VITALS — BP 148/54 | HR 84 | Temp 98.8°F | Ht 70.0 in | Wt 200.0 lb

## 2018-04-29 VITALS — BP 110/50 | HR 84 | Temp 98.8°F | Ht 70.0 in | Wt 200.0 lb

## 2018-04-29 DIAGNOSIS — N401 Enlarged prostate with lower urinary tract symptoms: Secondary | ICD-10-CM

## 2018-04-29 DIAGNOSIS — I482 Chronic atrial fibrillation, unspecified: Secondary | ICD-10-CM

## 2018-04-29 DIAGNOSIS — Z0000 Encounter for general adult medical examination without abnormal findings: Secondary | ICD-10-CM

## 2018-04-29 DIAGNOSIS — R350 Frequency of micturition: Secondary | ICD-10-CM

## 2018-04-29 DIAGNOSIS — I2510 Atherosclerotic heart disease of native coronary artery without angina pectoris: Secondary | ICD-10-CM

## 2018-04-29 DIAGNOSIS — I10 Essential (primary) hypertension: Secondary | ICD-10-CM

## 2018-04-29 DIAGNOSIS — E7800 Pure hypercholesterolemia, unspecified: Secondary | ICD-10-CM

## 2018-04-29 DIAGNOSIS — I25810 Atherosclerosis of coronary artery bypass graft(s) without angina pectoris: Secondary | ICD-10-CM | POA: Diagnosis not present

## 2018-04-29 NOTE — Progress Notes (Signed)
Subjective:   Micheal Wright is a 77 y.o. male who presents for Medicare Annual/Subsequent preventive examination.  Review of Systems:  N/A  Cardiac Risk Factors include: advanced age (>44men, >42 women);dyslipidemia;hypertension;male gender     Objective:    Vitals: BP (!) 148/54 (BP Location: Left Arm)   Pulse 84   Temp 98.8 F (37.1 C) (Oral)   Ht 5\' 10"  (1.778 m)   Wt 200 lb (90.7 kg)   BMI 28.70 kg/m   Body mass index is 28.7 kg/m.  Advanced Directives 04/29/2018 04/14/2018 03/19/2018 04/03/2016 11/14/2015 11/13/2015  Does Patient Have a Medical Advance Directive? No No No No No No  Would patient like information on creating a medical advance directive? No - Patient declined No - Patient declined No - Patient declined - No - patient declined information No - patient declined information    Tobacco Social History   Tobacco Use  Smoking Status Never Smoker  Smokeless Tobacco Never Used     Counseling given: Not Answered   Clinical Intake:  Pre-visit preparation completed: Yes  Pain : No/denies pain Pain Score: 0-No pain     Nutritional Status: BMI 25 -29 Overweight Nutritional Risks: None Diabetes: No  How often do you need to have someone help you when you read instructions, pamphlets, or other written materials from your doctor or pharmacy?: 1 - Never  Interpreter Needed?: No  Information entered by :: [facility], LPN  Past Medical History:  Diagnosis Date  . CAD (coronary artery disease)    s/p CABG 1998; Last cath 2002 in setting of marked ST elevation during stress testing. patent grafts; Echo 8/09 EF 55%. No significant valvular disease.  . Carotid artery disease ([place])    u/s 9/10: R 40-59% L 0-39%  . Erectile dysfunction   . GERD (gastroesophageal reflux disease)   . History of colonic polyps   . Hyperlipidemia   . Hypertension   . Sleep apnea    Past Surgical History:  Procedure Laterality Date  . ANGIOPLASTY     x 2  . APPENDECTOMY   1960's  . CARDIAC CATHETERIZATION  2002  . CARDIOVERSION N/A 11/14/2015   Procedure: CARDIOVERSION;  Surgeon: Jolaine Artist, MD;  [facility];  Service: Cardiovascular;  Laterality: N/A;  . COLONOSCOPY W/ POLYPECTOMY    . COLONOSCOPY WITH PROPOFOL N/A 11/13/2015   Procedure: COLONOSCOPY WITH PROPOFOL;  Surgeon: Manya Silvas, MD;  Location[facility] ENDOSCOPY;  Service: Endoscopy;  Laterality: N/A;  . CORONARY ARTERY BYPASS GRAFT  1998  . ESOPHAGOGASTRODUODENOSCOPY  04/2004   Inflam. at Z line (biopsy neg)  . KNEE ARTHROSCOPY W/ PARTIAL MEDIAL MENISCECTOMY  1990s   Left knee  . MRI    . sleep study    . UVULOPALATOPHARYNGOPLASTY     Family History  Problem Relation Age of Onset  . Stroke Mother   . Heart attack Father   . Alcohol abuse Father   . Breast cancer Sister   . Heart disease Sister   . Rheumatologic disease Brother        Heart  . Heart disease Brother   . Hypertension Neg Hx   . Diabetes Neg Hx   . Prostate cancer Neg Hx   . Colon cancer Neg Hx    Social History   Socioeconomic History  . Marital status: Married    Spouse name: Not on file  . Number of children: 3  . Years of education: Not on file  . Highest  education level: 12th grade  Occupational History  . 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 Financial resource strain: Not hard at all  . Food insecurity:    Worry: Never true    Inability: Never true  . Transportation needs:    Medical: No    Non-medical: No  Tobacco Use  . Smoking status: Never Smoker  . Smokeless tobacco: Never Used  Substance and Sexual Activity  . Alcohol use: No  . Drug use: No  . Sexual activity: Never  Lifestyle  . Physical activity:    Days per week: Not on file    Minutes per session: Not on file  . Stress: Not at all  Relationships  . Social connections:    Talks on phone: Not on file    Gets together: Not on file    Attends religious service: Not on file    Active member of  club or organization: Not on file    Attends meetings of clubs or organizations: Not on file    Relationship status: Not on file  Other Topics Concern  . Not on file  Social History Narrative   Married with 3 children    Outpatient Encounter Medications as of 04/29/2018  Medication Sig  . amLODipine (NORVASC) 5 MG tablet TAKE 1 TABLET BY MOUTH ONCE A DAY  . atorvastatin (LIPITOR) 80 MG tablet Take 1 tablet (80 mg total) by mouth daily.  Marland Kitchen ELIQUIS 5 MG TABS tablet TAKE 1 TABLET BY MOUTH TWICE (2) DAILY  . hydrochlorothiazide (MICROZIDE) 12.5 MG capsule Take 1 capsule (12.5 mg total) by mouth daily.  Marland Kitchen losartan (COZAAR) 50 MG tablet TAKE 1 TABLET BY MOUTH ONCE A DAY  . NITROSTAT 0.4 MG SL tablet DISSOLVE 1 TABLET UNDER THE TONGUE FOR CHEST PAIN. MAY REPEAT EVERY 5MINUTES UP TO 3 DOSES. IF NO RELIEF, CALL 911**  . omeprazole (PRILOSEC) 40 MG capsule TAKE ONE CAPSULE BY MOUTH DAILY  . ranitidine (ZANTAC) 150 MG tablet Take 1 tablet (150 mg total) by mouth 2 (two) times daily as needed for heartburn. (Patient not taking: Reported on 04/29/2018)   No facility-administered encounter medications on file as of 04/29/2018.     Activities of Daily Living In your present state of health, do you have any difficulty performing the following activities: 04/29/2018  Hearing? Y  Comment Wears bilateral hearing aids.   Vision? N  Difficulty concentrating or making decisions? N  Walking or climbing stairs? N  Dressing or bathing? N  Doing errands, shopping? N  Preparing Food and eating ? N  Using the Toilet? N  In the past six months, have you accidently leaked urine? N  Do you have problems with loss of bowel control? N  Managing your Medications? N  Managing your Finances? N  Housekeeping or managing your Housekeeping? N  Some recent data might be hidden    Patient Care Team: Jerrol Banana., MD as PCP - General (Unknown Physician Specialty) Bensimhon, Shaune Pascal, MD as Consulting  Physician (Cardiology) Carloyn Manner, MD as Referring Physician (Otolaryngology) Pa, [place] as Consulting Physician (Optometry) Manya Silvas, MD as Consulting Physician (Gastroenterology)   Assessment:   This is a routine wellness examination for Micheal Wright.  Exercise Activities and Dietary recommendations Current Exercise Habits: The patient does not participate in regular exercise at present("stays active"), Exercise limited by: None identified  Goals    . DIET - REDUCE SALT INTAKE TO 2 GRAMS PER DAY  OR LESS     Recommend decreasing amount of salt intake to 2 grams or less a day.        Fall Risk Fall Risk  04/29/2018 04/23/2017 04/03/2016 10/16/2015 10/16/2015  Falls in the past year? No No No No No   Is the patient's home free of loose throw rugs in walkways, pet beds, electrical cords, etc?   yes      Grab bars in the bathroom? no      Handrails on the stairs?   no      Adequate lighting?   yes  Timed Get Up and Go Performed: N/A  Depression Screen PHQ 2/9 Scores 04/29/2018 04/23/2017 04/03/2016 10/16/2015  PHQ - 2 Score 0 0 0 0    Cognitive Function     6CIT Screen 04/29/2018  What Year? 0 points  What month? 0 points  What time? 0 points  Count back from 20 0 points  Months in reverse 0 points  Repeat phrase 0 points  Total Score 0    Immunization History  Administered Date(s) Administered  . Influenza Split 08/23/2008, 09/14/2009, 09/24/2010, 08/19/2011, 08/17/2012  . Influenza Whole 10/02/2005  . Influenza, High Dose Seasonal PF 09/27/2014, 09/13/2015, 08/23/2016, 08/28/2017  . Influenza,inj,Quad PF,6+ Mos 08/07/2013  . Pneumococcal Conjugate-13 04/17/2015  . Pneumococcal Polysaccharide-23 08/23/2008  . Td 10/02/2002  . Tdap 04/17/2015    Qualifies for Shingles Vaccine? Due for Shingles vaccine. Declined my offer to administer today. Education has been provided regarding the importance of this vaccine. Pt has been advised to call her insurance  company to determine her out of pocket expense. Advised she may also receive this vaccine at her local pharmacy or Health Dept. Verbalized acceptance and understanding.  Screening Tests Health Maintenance  Topic Date Due  . INFLUENZA VACCINE  07/02/2018  . TETANUS/TDAP  04/16/2025  . PNA vac Low Risk Adult  Completed   Cancer Screenings: Lung: Low Dose CT Chest recommended if Age 80-80 years, 30 pack-year currently smoking OR have quit w/in 15years. Patient does not qualify. Colorectal: Up to date  Additional Screenings:  Hepatitis C Screening: N/A      Plan:  I have personally reviewed and addressed the Medicare Annual Wellness questionnaire and have noted the following in the patient's chart:  A. Medical and social history B. Use of alcohol, tobacco or illicit drugs  C. Current medications and supplements D. Functional ability and status E.  Nutritional status F.  Physical activity G. Advance directives H. List of other physicians I.  Hospitalizations, surgeries, and ER visits in previous 12 months J.  [place] such as hearing and vision if needed, cognitive and depression L. Referrals and appointments - none  In addition, I have reviewed and discussed with patient certain preventive protocols, quality metrics, and best practice recommendations. A written personalized care plan for preventive services as well as general preventive health recommendations were provided to patient.  See attached scanned questionnaire for additional information.   Signed,  Fabio Neighbors, LPN Nurse Health Advisor   Nurse Recommendations: None. Pt to have BP rechecked during CPE.

## 2018-04-29 NOTE — Patient Instructions (Addendum)
Micheal Wright , Thank you for taking time to come for your Medicare Wellness Visit. I appreciate your ongoing commitment to your health goals. Please review the following plan we discussed and let me know if I can assist you in the future.   Screening recommendations/referrals: Colonoscopy: Up to date Recommended yearly ophthalmology/optometry visit for glaucoma screening and checkup Recommended yearly dental visit for hygiene and checkup  Vaccinations: Influenza vaccine: Up to date Pneumococcal vaccine: Up to date Tdap vaccine: Up to date Shingles vaccine: Pt declines today.     Advanced directives: Advance directive discussed with you today. Even though you declined this today please call our office should you change your mind and we can give you the proper paperwork for you to fill out.  Conditions/risks identified: Recommend decreasing amount of salt intake to 2 grams or less a day.   Next appointment: 9:00 AM today with Dr Rosanna Randy.   Preventive Care 109 Years and Older, Male Preventive care refers to lifestyle choices and visits with your health care provider that can promote health and wellness. What does preventive care include?  A yearly physical exam. This is also called an annual well check.  Dental exams once or twice a year.  Routine eye exams. Ask your health care provider how often you should have your eyes checked.  Personal lifestyle choices, including:  Daily care of your teeth and gums.  Regular physical activity.  Eating a healthy diet.  Avoiding tobacco and drug use.  Limiting alcohol use.  Practicing safe sex.  Taking low doses of aspirin every day.  Taking vitamin and mineral supplements as recommended by your health care provider. What happens during an annual well check? The services and screenings done by your health care provider during your annual well check will depend on your age, overall health, lifestyle risk factors, and family history of  disease. Counseling  Your health care provider may ask you questions about your:  Alcohol use.  Tobacco use.  Drug use.  Emotional well-being.  Home and relationship well-being.  Sexual activity.  Eating habits.  History of falls.  Memory and ability to understand (cognition).  Work and work Statistician. Screening  You may have the following tests or measurements:  Height, weight, and BMI.  Blood pressure.  Lipid and cholesterol levels. These may be checked every 5 years, or more frequently if you are over 25 years old.  Skin check.  Lung cancer screening. You may have this screening every year starting at age 81 if you have a 30-pack-year history of smoking and currently smoke or have quit within the past 15 years.  Fecal occult blood test (FOBT) of the stool. You may have this test every year starting at age 53.  Flexible sigmoidoscopy or colonoscopy. You may have a sigmoidoscopy every 5 years or a colonoscopy every 10 years starting at age 85.  Prostate cancer screening. Recommendations will vary depending on your family history and other risks.  Hepatitis C blood test.  Hepatitis B blood test.  Sexually transmitted disease (STD) testing.  Diabetes screening. This is done by checking your blood sugar (glucose) after you have not eaten for a while (fasting). You may have this done every 1-3 years.  Abdominal aortic aneurysm (AAA) screening. You may need this if you are a current or former smoker.  Osteoporosis. You may be screened starting at age 46 if you are at high risk. Talk with your health care provider about your test results, treatment options, and  if necessary, the need for more tests. Vaccines  Your health care provider may recommend certain vaccines, such as:  Influenza vaccine. This is recommended every year.  Tetanus, diphtheria, and acellular pertussis (Tdap, Td) vaccine. You may need a Td booster every 10 years.  Zoster vaccine. You may  need this after age 97.  Pneumococcal 13-valent conjugate (PCV13) vaccine. One dose is recommended after age 39.  Pneumococcal polysaccharide (PPSV23) vaccine. One dose is recommended after age 75. Talk to your health care provider about which screenings and vaccines you need and how often you need them. This information is not intended to replace advice given to you by your health care provider. Make sure you discuss any questions you have with your health care provider. Document Released: 12/15/2015 Document Revised: 08/07/2016 Document Reviewed: 09/19/2015 Elsevier Interactive Patient Education  2017 [place] Prevention in the Home Falls can cause injuries. They can happen to people of all ages. There are many things you can do to make your home safe and to help prevent falls. What can I do on the outside of my home?  Regularly fix the edges of walkways and driveways and fix any cracks.  Remove anything that might make you trip as you walk through a door, such as a raised step or threshold.  Trim any bushes or trees on the path to your home.  Use bright outdoor lighting.  Clear any walking paths of anything that might make someone trip, such as rocks or tools.  Regularly check to see if handrails are loose or broken. Make sure that both sides of any steps have handrails.  Any raised decks and porches should have guardrails on the edges.  Have any leaves, snow, or ice cleared regularly.  Use sand or salt on walking paths during winter.  Clean up any spills in your garage right away. This includes oil or grease spills. What can I do in the bathroom?  Use night lights.  Install grab bars by the toilet and in the tub and shower. Do not use towel bars as grab bars.  Use non-skid mats or decals in the tub or shower.  If you need to sit down in the shower, use a plastic, non-slip stool.  Keep the floor dry. Clean up any water that spills on the floor as soon as it  happens.  Remove soap buildup in the tub or shower regularly.  Attach bath mats securely with double-sided non-slip rug tape.  Do not have throw rugs and other things on the floor that can make you trip. What can I do in the bedroom?  Use night lights.  Make sure that you have a light by your bed that is easy to reach.  Do not use any sheets or blankets that are too big for your bed. They should not hang down onto the floor.  Have a firm chair that has side arms. You can use this for support while you get dressed.  Do not have throw rugs and other things on the floor that can make you trip. What can I do in the kitchen?  Clean up any spills right away.  Avoid walking on wet floors.  Keep items that you use a lot in easy-to-reach places.  If you need to reach something above you, use a strong step stool that has a grab bar.  Keep electrical cords out of the way.  Do not use floor polish or wax that makes floors slippery. If you  must use wax, use non-skid floor wax.  Do not have throw rugs and other things on the floor that can make you trip. What can I do with my stairs?  Do not leave any items on the stairs.  Make sure that there are handrails on both sides of the stairs and use them. Fix handrails that are broken or loose. Make sure that handrails are as long as the stairways.  Check any carpeting to make sure that it is firmly attached to the stairs. Fix any carpet that is loose or worn.  Avoid having throw rugs at the top or bottom of the stairs. If you do have throw rugs, attach them to the floor with carpet tape.  Make sure that you have a light switch at the top of the stairs and the bottom of the stairs. If you do not have them, ask someone to add them for you. What else can I do to help prevent falls?  Wear shoes that:  Do not have high heels.  Have rubber bottoms.  Are comfortable and fit you well.  Are closed at the toe. Do not wear sandals.  If you  use a stepladder:  Make sure that it is fully opened. Do not climb a closed stepladder.  Make sure that both sides of the stepladder are locked into place.  Ask someone to hold it for you, if possible.  Clearly mark and make sure that you can see:  Any grab bars or handrails.  First and last steps.  Where the edge of each step is.  Use tools that help you move around (mobility aids) if they are needed. These include:  Canes.  Walkers.  Scooters.  Crutches.  Turn on the lights when you go into a dark area. Replace any light bulbs as soon as they burn out.  Set up your furniture so you have a clear path. Avoid moving your furniture around.  If any of your floors are uneven, fix them.  If there are any pets around you, be aware of where they are.  Review your medicines with your doctor. Some medicines can make you feel dizzy. This can increase your chance of falling. Ask your doctor what other things that you can do to help prevent falls. This information is not intended to replace advice given to you by your health care provider. Make sure you discuss any questions you have with your health care provider. Document Released: 09/14/2009 Document Revised: 04/25/2016 Document Reviewed: 12/23/2014 Elsevier Interactive Patient Education  2017 [employer].
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
## 2018-04-30 DIAGNOSIS — I10 Essential (primary) hypertension: Secondary | ICD-10-CM | POA: Diagnosis not present

## 2018-04-30 DIAGNOSIS — R350 Frequency of micturition: Secondary | ICD-10-CM | POA: Diagnosis not present

## 2018-04-30 DIAGNOSIS — N401 Enlarged prostate with lower urinary tract symptoms: Secondary | ICD-10-CM | POA: Diagnosis not present

## 2018-04-30 DIAGNOSIS — E7800 Pure hypercholesterolemia, unspecified: Secondary | ICD-10-CM | POA: Diagnosis not present

## 2018-05-01 LAB — CBC WITH DIFFERENTIAL/PLATELET
BASOS ABS: 0 10*3/uL (ref 0.0–0.2)
Basos: 1 %
EOS (ABSOLUTE): 0.1 10*3/uL (ref 0.0–0.4)
Eos: 2 %
Hematocrit: 36.3 % — ABNORMAL LOW (ref 37.5–51.0)
Hemoglobin: 12.5 g/dL — ABNORMAL LOW (ref 13.0–17.7)
Immature Grans (Abs): 0 10*3/uL (ref 0.0–0.1)
Immature Granulocytes: 0 %
LYMPHS ABS: 2.1 10*3/uL (ref 0.7–3.1)
Lymphs: 37 %
MCH: 31.9 pg (ref 26.6–33.0)
MCHC: 34.4 g/dL (ref 31.5–35.7)
MCV: 93 fL (ref 79–97)
Monocytes Absolute: 0.7 10*3/uL (ref 0.1–0.9)
Monocytes: 11 %
Neutrophils Absolute: 2.8 10*3/uL (ref 1.4–7.0)
Neutrophils: 49 %
PLATELETS: 264 10*3/uL (ref 150–450)
RBC: 3.92 x10E6/uL — ABNORMAL LOW (ref 4.14–5.80)
RDW: 13.9 % (ref 12.3–15.4)
WBC: 5.7 10*3/uL (ref 3.4–10.8)

## 2018-05-01 LAB — COMPREHENSIVE METABOLIC PANEL
A/G RATIO: 1.6 (ref 1.2–2.2)
ALBUMIN: 4 g/dL (ref 3.5–4.8)
ALK PHOS: 104 IU/L (ref 39–117)
ALT: 15 IU/L (ref 0–44)
AST: 18 IU/L (ref 0–40)
BILIRUBIN TOTAL: 1.3 mg/dL — AB (ref 0.0–1.2)
BUN / CREAT RATIO: 12 (ref 10–24)
BUN: 13 mg/dL (ref 8–27)
CHLORIDE: 102 mmol/L (ref 96–106)
CO2: 22 mmol/L (ref 20–29)
Calcium: 9.1 mg/dL (ref 8.6–10.2)
Creatinine, Ser: 1.12 mg/dL (ref 0.76–1.27)
GFR calc Af Amer: 73 mL/min/{1.73_m2} (ref >59)
GFR calc non Af Amer: 63 mL/min/{1.73_m2} (ref >59)
GLUCOSE: 87 mg/dL (ref 65–99)
Globulin, Total: 2.5 g/dL (ref 1.5–4.5)
POTASSIUM: 4.6 mmol/L (ref 3.5–5.2)
Sodium: 139 mmol/L (ref 134–144)
Total Protein: 6.5 g/dL (ref 6.0–8.5)

## 2018-05-01 LAB — LIPID PANEL WITH LDL/HDL RATIO
CHOLESTEROL TOTAL: 85 mg/dL — AB (ref 100–199)
HDL: 29 mg/dL — ABNORMAL LOW (ref >39)
LDL Calculated: 41 mg/dL (ref 0–99)
LDl/HDL Ratio: 1.4 ratio (ref 0.0–3.6)
Triglycerides: 77 mg/dL (ref 0–149)
VLDL CHOLESTEROL CAL: 15 mg/dL (ref 5–40)

## 2018-05-01 LAB — PSA: Prostate Specific Ag, Serum: 2.2 ng/mL (ref 0.0–4.0)

## 2018-05-01 LAB — TSH: TSH: 1.08 u[IU]/mL (ref 0.450–4.500)

## 2018-05-04 ENCOUNTER — Other Ambulatory Visit (HOSPITAL_COMMUNITY): Payer: Self-pay | Admitting: Internal Medicine

## 2018-05-04 ENCOUNTER — Encounter (HOSPITAL_COMMUNITY): Payer: Medicare Other | Admitting: Internal Medicine

## 2018-05-04 DIAGNOSIS — I2510 Atherosclerotic heart disease of native coronary artery without angina pectoris: Secondary | ICD-10-CM

## 2018-05-06 ENCOUNTER — Telehealth: Payer: Self-pay

## 2018-05-06 NOTE — Telephone Encounter (Signed)
Pt advised.   Thanks,   -Laura  

## 2018-05-06 NOTE — Telephone Encounter (Signed)
-----   Message from Jerrol Banana., MD sent at 05/06/2018  1:37 PM EDT ----- Stable . RTC 3 months to repeat CBC and f/u.

## 2018-05-26 NOTE — Telephone Encounter (Signed)
Patient has a 10 week follow up appointment scheduled for 07/30/18 at 9:20 2019. Patient understands he needs to keep this appointment for insurance compliance.  Left detailed message on voicemail with date and time of titration and informed patient to call back to confirm or reschedule.

## 2018-06-05 ENCOUNTER — Other Ambulatory Visit: Payer: Self-pay | Admitting: Internal Medicine

## 2018-06-19 ENCOUNTER — Telehealth: Payer: Self-pay | Admitting: *Deleted

## 2018-06-19 NOTE — Telephone Encounter (Signed)
-----   Message from Sueanne Margarita, MD sent at 06/18/2018  1:54 PM EDT ----- Good AHI and compliance.  Continue current PAP settings.

## 2018-06-19 NOTE — Telephone Encounter (Signed)
Informed patient of compliance results and verbalized understanding was indicated. Patient is aware and agreeable to AHI being within range at 2.7. Patient is aware and agreeable to being in compliance with machine usage. Patient is aware and agreeable to no change in current pressures.

## 2018-07-13 DIAGNOSIS — H6123 Impacted cerumen, bilateral: Secondary | ICD-10-CM | POA: Diagnosis not present

## 2018-07-13 DIAGNOSIS — H903 Sensorineural hearing loss, bilateral: Secondary | ICD-10-CM | POA: Diagnosis not present

## 2018-07-28 DIAGNOSIS — L821 Other seborrheic keratosis: Secondary | ICD-10-CM | POA: Diagnosis not present

## 2018-07-30 ENCOUNTER — Encounter: Payer: Self-pay | Admitting: Cardiology

## 2018-07-30 ENCOUNTER — Telehealth: Payer: Self-pay | Admitting: *Deleted

## 2018-07-30 ENCOUNTER — Other Ambulatory Visit: Payer: Self-pay | Admitting: Family Medicine

## 2018-07-30 ENCOUNTER — Other Ambulatory Visit (HOSPITAL_COMMUNITY): Payer: Self-pay | Admitting: Internal Medicine

## 2018-07-30 ENCOUNTER — Ambulatory Visit (INDEPENDENT_AMBULATORY_CARE_PROVIDER_SITE_OTHER): Payer: Medicare Other | Admitting: Cardiology

## 2018-07-30 VITALS — BP 138/58 | HR 60 | Ht 70.0 in | Wt 202.8 lb

## 2018-07-30 DIAGNOSIS — I10 Essential (primary) hypertension: Secondary | ICD-10-CM

## 2018-07-30 DIAGNOSIS — G4733 Obstructive sleep apnea (adult) (pediatric): Secondary | ICD-10-CM

## 2018-07-30 DIAGNOSIS — I2510 Atherosclerotic heart disease of native coronary artery without angina pectoris: Secondary | ICD-10-CM | POA: Diagnosis not present

## 2018-07-30 NOTE — Progress Notes (Signed)
Cardiology Office Note:    Date:  07/30/2018   ID:  Micheal Wright, DOB 1941/04/17, MRN [id number]  PCP:  Jerrol Banana., MD  Cardiologist:  No primary care provider on file.    Referring MD: Jerrol Banana.,*   Chief Complaint  Patient presents with  . Sleep Apnea  . Hypertension    History of Present Illness:    Micheal Wright is a 77 y.o. male with a hx of CAD, chronic afib and HTN who was referred by Dr. Wynonia Lawman for sleep study due to snoring, witnessed apnea and excessive daytime sleepiness with epworth sleepiness score of 10.  He underwent sleep study showing mild OSA with an AHI of 6.1/hr and CPAP was recommended due to elevated ESS and afib.  He underwent CPAP titration to 5cm H2O.  HE is doing well with his CPAP device and thinks that he has gotten used to it.  He tolerates the mask and feels the pressure is adequate.  Since going on CPAP he feels rested in the am and has no significant daytime sleepiness.  He denies any significant mouth or nasal dryness or nasal congestion.  He does not think that he snores.     Past Medical History:  Diagnosis Date  . APC (atrial premature contractions) 05/04/2014  . 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 Atherosclerosis of coronary artery 11/08/2008   All risk factors treated.   . Atrial complex, premature 05/04/2014  . CAD (coronary artery disease)    s/p CABG 1998; Last cath 2002 in setting of marked ST elevation during stress testing. patent grafts; Echo 8/09 EF 55%. No significant valvular disease.  . Cardiac murmur 09/03/2011   Overview:  Overview:  Last Assessment & Plan:  Likely mild aortic valve sclerosis. Will check echo.    . Carotid artery disease ([place])    u/s 9/10: R 40-59% L 0-39%  . Carotid artery obstruction 08/22/2009   Overview:  Overview:  Qualifier: Diagnosis of  By: Haroldine Laws, MD, Eileen Stanford  Last Assessment & Plan:  Asymptomatic. Due for repeat u/s.  Overview:  Overview:  Qualifier: Diagnosis of  By:  Haroldine Laws, MD, Eileen Stanford  Last Assessment & Plan:  Asymptomatic. Due for repeat u/s.    . Carotid stenosis 08/22/2009   Qualifier: Diagnosis of  By: Haroldine Laws, MD, Eileen Stanford Cerebrovascular disease 11/17/2008  . Chest tightness 01/20/2015  . Chronic atrial fibrillation ([place]) 03/02/2018  . Coronary artery disease 05/11/2007   Qualifier: Diagnosis of  By: Lurlean Nanny LPN, Reg[place]    . Erectile dysfunction   . Essential hypertension 05/11/2007   Overview:  Overview:  Overview:  Qualifier: Diagnosis of  By: Lurlean Nanny LPN, Regina   Last Assessment & Plan:  BP up slightly here but when he checks at [employer] usually 120/65. Will continue current regimen.  Qualifier: Diagnosis of  By: Lurlean Nanny LPN, Regina     . GERD (gastroesophageal reflux disease)   . History of colonic polyps   . Hypercholesteremia 11/08/2008  . Hyperlipidemia   . Hypertension   . Murmur, cardiac 09/03/2011  . PAC (premature atrial contraction) 05/04/2014  . 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 Sleep apnea     Past Surgical History:  Procedure Laterality Date  . ANGIOPLASTY     x 2  . APPENDECTOMY  1960's  . CARDIAC CATHETERIZATION  2002  . CARDIOVERSION N/A 11/14/2015   Procedure: CARDIOVERSION;  Surgeon: Jolaine Artist, MD;  Location: [facility] ENDOSCOPY;  Service: Cardiovascular;  Laterality: N/A;  . COLONOSCOPY W/ POLYPECTOMY    . COLONOSCOPY WITH PROPOFOL N/A 11/13/2015   Procedure: COLONOSCOPY WITH PROPOFOL;  Surgeon: Manya Silvas, MD;  Location: [facility] ENDOSCOPY;  Service: Endoscopy;  Laterality: N/A;  . CORONARY ARTERY BYPASS GRAFT  1998  . ESOPHAGOGASTRODUODENOSCOPY  04/2004   Inflam. at Z line (biopsy neg)  . KNEE ARTHROSCOPY W/ PARTIAL MEDIAL MENISCECTOMY  1990s   Left knee  . MRI    . sleep study    . UVULOPALATOPHARYNGOPLASTY      Current Medications: Current Meds  Medication Sig  . amLODipine (NORVASC) 5 MG tablet TAKE 1 TABLET BY MOUTH ONCE DAILY  . atorvastatin (LIPITOR) 80 MG tablet TAKE 1  TABLET BY MOUTH ONCE DAILY  . ELIQUIS 5 MG TABS tablet TAKE 1 TABLET BY MOUTH TWICE (2) DAILY  . losartan (COZAAR) 50 MG tablet TAKE 1 TABLET BY MOUTH ONCE A DAY  . NITROSTAT 0.4 MG SL tablet DISSOLVE 1 TABLET UNDER THE TONGUE FOR CHEST PAIN. MAY REPEAT EVERY 5MINUTES UP TO 3 DOSES. IF NO RELIEF, CALL 911**  . omeprazole (PRILOSEC) 40 MG capsule TAKE ONE CAPSULE BY MOUTH DAILY     Allergies:   Patient has no known allergies.   Social History   Socioeconomic History  . Marital status: Married    Spouse name: Not on file  . Number of children: 3  . Years of education: Not on file  . Highest education level: 12th grade  Occupational History  . 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 Financial resource strain: Not hard at all  . Food insecurity:    Worry: Never true    Inability: Never true  . Transportation needs:    Medical: No    Non-medical: No  Tobacco Use  . Smoking status: Never Smoker  . Smokeless tobacco: Never Used  Substance and Sexual Activity  . Alcohol use: No  . Drug use: No  . Sexual activity: Never  Lifestyle  . Physical activity:    Days per week: Not on file    Minutes per session: Not on file  . Stress: Not at all  Relationships  . Social connections:    Talks on phone: Not on file    Gets together: Not on file    Attends religious service: Not on file    Active member of club or organization: Not on file    Attends meetings of clubs or organizations: Not on file    Relationship status: Not on file  Other Topics Concern  . Not on file  Social History Narrative   Married with 3 children     Family History: The patient's family history includes Alcohol abuse in his father; Breast cancer in his sister; Heart attack in his father; Heart disease in his brother and sister; Rheumatologic disease in his brother; Stroke in his mother. There is no history of Hypertension, Diabetes, Prostate cancer, or Colon cancer.  ROS:   Please see the  history of present illness.    ROS  All other systems reviewed and negative.   EKGs/Labs/Other Studies Reviewed:    The following studies were reviewed today: PAP download  EKG:  EKG is not ordered today.    Recent Labs: 03/02/2018: B Natriuretic Peptide 141.2 04/30/2018: ALT 15; BUN 13; Creatinine, Ser 1.12; Hemoglobin 12.5; Platelets 264; Potassium 4.6; Sodium 139; TSH 1.080   Recent Lipid Panel    Component  Value Date/Time   CHOL 85 (L) 04/30/2018 0932   TRIG 77 04/30/2018 0932   HDL 29 (L) 04/30/2018 0932   CHOLHDL 2.9 04/29/2017 0932   CHOLHDL 2.6 Ratio 10/16/2010 2245   VLDL 15 10/16/2010 2245   LDLCALC 41 04/30/2018 0932    Physical Exam:    VS:  BP (!) 138/58   Pulse 60   Ht 5\' 10"  (1.778 m)   Wt 202 lb 12.8 oz (92 kg)   SpO2 95%   BMI 29.10 kg/m     Wt Readings from Last 3 Encounters:  07/30/18 202 lb 12.8 oz (92 kg)  04/29/18 200 lb (90.7 kg)  04/29/18 200 lb (90.7 kg)     GEN:  Well nourished, well developed in no acute distress HEENT: Normal NECK: No JVD; No carotid bruits LYMPHATICS: No lymphadenopathy CARDIAC: RRR, no murmurs, rubs, gallops RESPIRATORY:  Clear to auscultation without rales, wheezing or rhonchi  ABDOMEN: Soft, non-tender, non-distended MUSCULOSKELETAL:  No edema; No deformity  SKIN: Warm and dry NEUROLOGIC:  Alert and oriented x 3 PSYCHIATRIC:  Normal affect   ASSESSMENT:    1. Obstructive sleep apnea syndrome   2. Essential hypertension    PLAN:    In order of problems listed above:  1.  OSA - the patient is tolerating PAP therapy well without any problems. The PAP download was reviewed today and showed an AHI of 4/hr on 5 cm H2O with 97% compliance in using more than 4 hours nightly.  The patient has been using and benefiting from PAP use and will continue to benefit from therapy. He would like his Pressure increased a little - he says that when he takes a deep breath in he feels like he can't get enough air.  I will  increase PAP pressure to 6cm H2O.   2.  HTN - BP is controlled on exam today. He will continue on amlodipine 5mg  daily, HCTZ 12.5mg  daily and losartan 50mg  daily    Medication Adjustments/Labs and Tests Ordered: Current medicines are reviewed at length with the patient today.  Concerns regarding medicines are outlined above.  No orders of the defined types were placed in this encounter.  No orders of the defined types were placed in this encounter.   Signed, Fransico Him, MD  07/30/2018 9:50 AM    [place]

## 2018-07-30 NOTE — Patient Instructions (Signed)
Medication Instructions:  Your physician recommends that you continue on your current medications as directed. Please refer to the Current Medication list given to you today.  Follow-Up: Your physician wants you to follow-up in: 1 year with Dr. Radford Pax. You will receive a reminder letter in the mail two months in advance. If you don't receive a letter, please call our office to schedule the follow-up appointment.  If you need a refill on your cardiac medications before your next appointment, please call your pharmacy.

## 2018-07-30 NOTE — Telephone Encounter (Signed)
-----   Message from Sarina Ill, RN sent at 07/30/2018  9:58 AM EDT ----- Regarding: Sleep Hello,  This patient is increasing his CPAP to 6 cm H20. Thanks, [employer]

## 2018-07-30 NOTE — Telephone Encounter (Signed)
Order sent to [facility]

## 2018-08-10 ENCOUNTER — Ambulatory Visit (INDEPENDENT_AMBULATORY_CARE_PROVIDER_SITE_OTHER): Payer: Medicare Other | Admitting: Family Medicine

## 2018-08-10 VITALS — BP 142/68 | HR 64 | Temp 98.2°F | Resp 16 | Wt 201.0 lb

## 2018-08-10 DIAGNOSIS — D649 Anemia, unspecified: Secondary | ICD-10-CM | POA: Diagnosis not present

## 2018-08-10 DIAGNOSIS — M791 Myalgia, unspecified site: Secondary | ICD-10-CM

## 2018-08-10 DIAGNOSIS — Z23 Encounter for immunization: Secondary | ICD-10-CM

## 2018-08-10 DIAGNOSIS — K219 Gastro-esophageal reflux disease without esophagitis: Secondary | ICD-10-CM | POA: Diagnosis not present

## 2018-08-10 DIAGNOSIS — I491 Atrial premature depolarization: Secondary | ICD-10-CM

## 2018-08-10 DIAGNOSIS — I10 Essential (primary) hypertension: Secondary | ICD-10-CM | POA: Diagnosis not present

## 2018-08-10 DIAGNOSIS — I480 Paroxysmal atrial fibrillation: Secondary | ICD-10-CM

## 2018-08-10 DIAGNOSIS — I2510 Atherosclerotic heart disease of native coronary artery without angina pectoris: Secondary | ICD-10-CM

## 2018-08-10 DIAGNOSIS — H9193 Unspecified hearing loss, bilateral: Secondary | ICD-10-CM

## 2018-08-10 NOTE — Progress Notes (Signed)
Micheal Wright  MRN: [id number] DOB: 05/24/1941  Subjective:  HPI   The patient is a 77 year old male who presents for follow up of his hypertension.  He was last seen on 04/29/18. The patient checks his blood pressure at home and gets readings in the range of 115-145 over 65-85. He feels well . He is on CPAP at night. He has chronic muscle aches. No CAD symptoms. Still functioning well. Hearing loss is still his biggest complaint. Patient Active Problem List   Diagnosis Date Noted  . Chronic atrial fibrillation ([place]) 03/02/2018  . 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 Basal cell carcinoma of skin 10/16/2015  . Benign prostatic hyperplasia with urinary obstruction 10/16/2015  . Neuropathy 10/16/2015  . Chest tightness 01/20/2015  . Posthitis 01/10/2015  . PAC (premature atrial contraction) 05/04/2014  . Atrial complex, premature 05/04/2014  . APC (atrial premature contractions) 05/04/2014  . Blood in semen 08/31/2012  . Murmur, cardiac 09/03/2011  . Cardiac murmur 09/03/2011  . 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 DISTURBANCE OF SKIN SENSATION 12/21/2009  . Carotid stenosis 08/22/2009  . Carotid artery obstruction 08/22/2009  . Low back pain 03/24/2009  . Cerebrovascular disease 11/17/2008  . Atherosclerosis of coronary artery 11/08/2008  . Hypercholesteremia 11/08/2008  . COLD SORE 11/12/2007  . Herpes 11/12/2007  . Hyperlipidemia 05/11/2007  . ERECTILE DYSFUNCTION 05/11/2007  . Essential hypertension 05/11/2007  . Coronary artery disease 05/11/2007  . GERD 05/11/2007  . Sleep apnea 05/11/2007  . COLONIC POLYPS, HX OF 05/11/2007  . Gastro-esophageal reflux disease without esophagitis 05/11/2007  . History of colon polyps 05/11/2007    Past Medical History:  Diagnosis Date  . APC (atrial premature contractions) 05/04/2014  . 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  Atherosclerosis of coronary artery 11/08/2008   All risk factors treated.   . Atrial complex, premature 05/04/2014  . CAD (coronary artery disease)    s/p CABG 1998; Last cath 2002 in setting of marked ST elevation during stress testing. patent grafts; Echo 8/09 EF 55%. No significant valvular disease.  . Cardiac murmur 09/03/2011   Overview:  Overview:  Last Assessment & Plan:  Likely mild aortic valve sclerosis. Will check echo.    . Carotid artery disease ([place])    u/s 9/10: R 40-59% L 0-39%  . Carotid artery obstruction 08/22/2009   Overview:  Overview:  Qualifier: Diagnosis of  By: Haroldine Laws, MD, Eileen Stanford  Last Assessment & Plan:  Asymptomatic. Due for repeat u/s.  Overview:  Overview:  Qualifier: Diagnosis of  By: Haroldine Laws, MD, Eileen Stanford  Last Assessment & Plan:  Asymptomatic. Due for repeat u/s.    . Carotid stenosis 08/22/2009   Qualifier: Diagnosis of  By: Haroldine Laws, MD, Eileen Stanford Cerebrovascular disease 11/17/2008  . Chest tightness 01/20/2015  . Chronic atrial fibrillation ([place]) 03/02/2018  . Coronary artery disease 05/11/2007   Qualifier: Diagnosis of  By: Lurlean Nanny LPN, [place]    . Erectile dysfunction   . Essential hypertension 05/11/2007   Overview:  Overview:  Overview:  Qualifier: Diagnosis of  By: Lurlean Nanny LPN, Regina   Last Assessment & Plan:  BP up slightly here but when he checks [employer] usually 120/65. Will continue current regimen.  Qualifier: Diagnosis of  By: Lurlean Nanny LPN, Regina     . GERD (gastroesophageal reflux disease)   . History of colonic polyps   .  Hypercholesteremia 11/08/2008  . Hyperlipidemia   . Hypertension   . Murmur, cardiac 09/03/2011  . PAC (premature atrial contraction) 05/04/2014  . 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 Sleep apnea     Social History   Socioeconomic History  . Marital status: Married    Spouse name: Not on file  . Number of children: 3  . Years of education: Not on file  . Highest education level: 12th  grade  Occupational History  . 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 Financial resource strain: Not hard at all  . Food insecurity:    Worry: Never true    Inability: Never true  . Transportation needs:    Medical: No    Non-medical: No  Tobacco Use  . Smoking status: Never Smoker  . Smokeless tobacco: Never Used  Substance and Sexual Activity  . Alcohol use: No  . Drug use: No  . Sexual activity: Never  Lifestyle  . Physical activity:    Days per week: Not on file    Minutes per session: Not on file  . Stress: Not at all  Relationships  . Social connections:    Talks on phone: Not on file    Gets together: Not on file    Attends religious service: Not on file    Active member of club or organization: Not on file    Attends meetings of clubs or organizations: Not on file    Relationship status: Not on file  . Intimate partner violence:    Fear of current or ex partner: Not on file    Emotionally abused: Not on file    Physically abused: Not on file    Forced sexual activity: Not on file  Other Topics Concern  . Not on file  Social History Narrative   Married with 3 children    Outpatient Encounter Medications as of 08/10/2018  Medication Sig  . amLODipine (NORVASC) 5 MG tablet TAKE 1 TABLET BY MOUTH ONCE DAILY  . atorvastatin (LIPITOR) 80 MG tablet TAKE 1 TABLET BY MOUTH ONCE DAILY  . ELIQUIS 5 MG TABS tablet TAKE 1 TABLET BY MOUTH TWICE (2) DAILY  . losartan (COZAAR) 50 MG tablet TAKE 1 TABLET BY MOUTH ONCE A DAY  . NITROSTAT 0.4 MG SL tablet DISSOLVE 1 TABLET UNDER THE TONGUE FOR CHEST PAIN. MAY REPEAT EVERY 5MINUTES UP TO 3 DOSES. IF NO RELIEF, CALL 911**  . omeprazole (PRILOSEC) 40 MG capsule TAKE 1 CAPSULE BY MOUTH ONCE DAILY  . hydrochlorothiazide (MICROZIDE) 12.5 MG capsule Take 1 capsule (12.5 mg total) by mouth daily.   No facility-administered encounter medications on file as of 08/10/2018.     No Known Allergies  Review of Systems    Constitutional: Negative for fever and malaise/fatigue.  Eyes: Negative.   Respiratory: Negative for cough, shortness of breath and wheezing.   Cardiovascular: Negative for chest pain, palpitations, orthopnea, claudication and leg swelling.  Gastrointestinal: Negative.   Musculoskeletal: Negative.   Neurological: Negative.   Endo/Heme/Allergies: Negative.   Psychiatric/Behavioral: Negative.     Objective:  BP (!) 142/68 (BP Location: Left Arm, Patient Position: Sitting, Cuff Size: Normal)   Pulse 64   Temp 98.2 F (36.8 C) (Oral)   Resp 16   Wt 201 lb (91.2 kg)   BMI 28.84 kg/m   Physical Exam  Constitutional: He is oriented to person, place, and time and well-developed, well-nourished, and in no distress.  HENT:  Head: Normocephalic.  Right Ear: External ear normal.  Left Ear: External ear normal.  Nose: Nose normal.  Mouth/Throat: Oropharynx is clear and moist.  Eyes: Conjunctivae are normal. No scleral icterus.  Neck: No thyromegaly present.  Cardiovascular: Normal rate, regular rhythm and normal heart sounds.  Pulmonary/Chest: Effort normal and breath sounds normal.  Abdominal: Soft.  Musculoskeletal: He exhibits no edema.  Lymphadenopathy:    He has no cervical adenopathy.  Neurological: He is alert and oriented to person, place, and time. Gait normal. GCS score is 15.  Skin: Skin is warm.  Psychiatric: Mood, memory, affect and judgment normal.    Assessment and Plan :  1. Anemia, unspecified type  - CBC with Differential/Platelet  2. Myalgia Possible statin? - CK  3. Essential hypertension  - Comprehensive metabolic panel  4. Gastroesophageal reflux disease, esophagitis presence not specified  - CBC with Differential/Platelet  5. Need for influenza vaccination  - Flu vaccine HIGH DOSE PF (Fluzone High dose) 6.CAD All risk factors treated. 7.Afib On Eliquis.  I have done the exam and reviewed the chart and it is accurate to the best of my  knowledge. Development worker, community has been used and  any errors in dictation or transcription are unintentional. Miguel Aschoff M.D. [facility]

## 2018-08-11 ENCOUNTER — Telehealth: Payer: Self-pay

## 2018-08-11 LAB — CBC WITH DIFFERENTIAL/PLATELET
BASOS: 1 %
Basophils Absolute: 0 10*3/uL (ref 0.0–0.2)
EOS (ABSOLUTE): 0.1 10*3/uL (ref 0.0–0.4)
EOS: 2 %
HEMATOCRIT: 40 % (ref 37.5–51.0)
HEMOGLOBIN: 13.4 g/dL (ref 13.0–17.7)
IMMATURE GRANS (ABS): 0 10*3/uL (ref 0.0–0.1)
Immature Granulocytes: 0 %
LYMPHS ABS: 1.9 10*3/uL (ref 0.7–3.1)
LYMPHS: 34 %
MCH: 31.5 pg (ref 26.6–33.0)
MCHC: 33.5 g/dL (ref 31.5–35.7)
MCV: 94 fL (ref 79–97)
MONOCYTES: 13 %
Monocytes Absolute: 0.7 10*3/uL (ref 0.1–0.9)
NEUTROS ABS: 2.9 10*3/uL (ref 1.4–7.0)
Neutrophils: 50 %
Platelets: 251 10*3/uL (ref 150–450)
RBC: 4.25 x10E6/uL (ref 4.14–5.80)
RDW: 13.4 % (ref 12.3–15.4)
WBC: 5.7 10*3/uL (ref 3.4–10.8)

## 2018-08-11 LAB — COMPREHENSIVE METABOLIC PANEL
ALBUMIN: 4.3 g/dL (ref 3.5–4.8)
ALK PHOS: 117 IU/L (ref 39–117)
ALT: 16 IU/L (ref 0–44)
AST: 18 IU/L (ref 0–40)
Albumin/Globulin Ratio: 1.3 (ref 1.2–2.2)
BILIRUBIN TOTAL: 0.9 mg/dL (ref 0.0–1.2)
BUN / CREAT RATIO: 9 — AB (ref 10–24)
BUN: 9 mg/dL (ref 8–27)
CHLORIDE: 104 mmol/L (ref 96–106)
CO2: 25 mmol/L (ref 20–29)
CREATININE: 1.05 mg/dL (ref 0.76–1.27)
Calcium: 9.8 mg/dL (ref 8.6–10.2)
GFR calc non Af Amer: 68 mL/min/{1.73_m2} (ref >59)
GFR, EST AFRICAN AMERICAN: 79 mL/min/{1.73_m2} (ref >59)
GLOBULIN, TOTAL: 3.2 g/dL (ref 1.5–4.5)
GLUCOSE: 95 mg/dL (ref 65–99)
Potassium: 4 mmol/L (ref 3.5–5.2)
Sodium: 142 mmol/L (ref 134–144)
Total Protein: 7.5 g/dL (ref 6.0–8.5)

## 2018-08-11 LAB — CK: Total CK: 73 U/L (ref 24–204)

## 2018-08-11 NOTE — Telephone Encounter (Signed)
-----   Message from Jerrol Banana., MD sent at 08/11/2018  9:42 AM EDT ----- Labs all normal.

## 2018-08-11 NOTE — Telephone Encounter (Signed)
Pt advised.   Thanks,   -Kihanna Kamiya  

## 2018-08-27 DIAGNOSIS — L570 Actinic keratosis: Secondary | ICD-10-CM | POA: Diagnosis not present

## 2018-08-27 DIAGNOSIS — Z08 Encounter for follow-up examination after completed treatment for malignant neoplasm: Secondary | ICD-10-CM | POA: Diagnosis not present

## 2018-08-27 DIAGNOSIS — Z85828 Personal history of other malignant neoplasm of skin: Secondary | ICD-10-CM | POA: Diagnosis not present

## 2018-08-27 DIAGNOSIS — C44619 Basal cell carcinoma of skin of left upper limb, including shoulder: Secondary | ICD-10-CM | POA: Diagnosis not present

## 2018-08-27 DIAGNOSIS — D485 Neoplasm of uncertain behavior of skin: Secondary | ICD-10-CM | POA: Diagnosis not present

## 2018-08-27 DIAGNOSIS — X32XXXA Exposure to sunlight, initial encounter: Secondary | ICD-10-CM | POA: Diagnosis not present

## 2018-09-03 ENCOUNTER — Other Ambulatory Visit (HOSPITAL_COMMUNITY): Payer: Self-pay | Admitting: Internal Medicine

## 2018-09-03 DIAGNOSIS — I2510 Atherosclerotic heart disease of native coronary artery without angina pectoris: Secondary | ICD-10-CM

## 2018-09-16 DIAGNOSIS — C44529 Squamous cell carcinoma of skin of other part of trunk: Secondary | ICD-10-CM | POA: Diagnosis not present

## 2018-09-16 DIAGNOSIS — C44619 Basal cell carcinoma of skin of left upper limb, including shoulder: Secondary | ICD-10-CM | POA: Diagnosis not present

## 2018-09-16 DIAGNOSIS — D485 Neoplasm of uncertain behavior of skin: Secondary | ICD-10-CM | POA: Diagnosis not present

## 2018-10-01 DIAGNOSIS — C44529 Squamous cell carcinoma of skin of other part of trunk: Secondary | ICD-10-CM | POA: Diagnosis not present

## 2018-10-07 ENCOUNTER — Encounter (HOSPITAL_COMMUNITY): Payer: Self-pay | Admitting: Internal Medicine

## 2018-10-07 ENCOUNTER — Ambulatory Visit (HOSPITAL_COMMUNITY)
Admission: RE | Admit: 2018-10-07 | Discharge: 2018-10-07 | Disposition: A | Discharge status: Home / Self Care | Payer: Medicare Other | Source: Ambulatory Visit | Attending: Internal Medicine | Admitting: Internal Medicine

## 2018-10-07 ENCOUNTER — Other Ambulatory Visit: Payer: Self-pay

## 2018-10-07 VITALS — BP 111/55 | HR 55 | Wt 199.8 lb

## 2018-10-07 DIAGNOSIS — I25810 Atherosclerosis of coronary artery bypass graft(s) without angina pectoris: Secondary | ICD-10-CM

## 2018-10-07 DIAGNOSIS — I6529 Occlusion and stenosis of unspecified carotid artery: Secondary | ICD-10-CM | POA: Insufficient documentation

## 2018-10-07 DIAGNOSIS — E875 Hyperkalemia: Secondary | ICD-10-CM | POA: Insufficient documentation

## 2018-10-07 DIAGNOSIS — K219 Gastro-esophageal reflux disease without esophagitis: Secondary | ICD-10-CM | POA: Insufficient documentation

## 2018-10-07 DIAGNOSIS — Z951 Presence of aortocoronary bypass graft: Secondary | ICD-10-CM | POA: Diagnosis not present

## 2018-10-07 DIAGNOSIS — I482 Chronic atrial fibrillation, unspecified: Secondary | ICD-10-CM | POA: Insufficient documentation

## 2018-10-07 DIAGNOSIS — Z7901 Long term (current) use of anticoagulants: Secondary | ICD-10-CM | POA: Insufficient documentation

## 2018-10-07 DIAGNOSIS — R001 Bradycardia, unspecified: Secondary | ICD-10-CM | POA: Insufficient documentation

## 2018-10-07 DIAGNOSIS — R0600 Dyspnea, unspecified: Secondary | ICD-10-CM | POA: Diagnosis not present

## 2018-10-07 DIAGNOSIS — I2510 Atherosclerotic heart disease of native coronary artery without angina pectoris: Secondary | ICD-10-CM | POA: Insufficient documentation

## 2018-10-07 DIAGNOSIS — E785 Hyperlipidemia, unspecified: Secondary | ICD-10-CM | POA: Diagnosis not present

## 2018-10-07 DIAGNOSIS — R600 Localized edema: Secondary | ICD-10-CM | POA: Diagnosis not present

## 2018-10-07 DIAGNOSIS — G4733 Obstructive sleep apnea (adult) (pediatric): Secondary | ICD-10-CM | POA: Diagnosis not present

## 2018-10-07 DIAGNOSIS — Z79899 Other long term (current) drug therapy: Secondary | ICD-10-CM | POA: Diagnosis not present

## 2018-10-07 DIAGNOSIS — I10 Essential (primary) hypertension: Secondary | ICD-10-CM | POA: Insufficient documentation

## 2018-10-07 LAB — BASIC METABOLIC PANEL
ANION GAP: 5 (ref 5–15)
BUN: 9 mg/dL (ref 8–23)
CALCIUM: 9.3 mg/dL (ref 8.9–10.3)
CHLORIDE: 105 mmol/L (ref 98–111)
CO2: 28 mmol/L (ref 22–32)
Creatinine, Ser: 1.09 mg/dL (ref 0.61–1.24)
GFR calc non Af Amer: >60 mL/min (ref >60)
Glucose, Bld: 107 mg/dL — ABNORMAL HIGH (ref 70–99)
POTASSIUM: 4.5 mmol/L (ref 3.5–5.1)
Sodium: 138 mmol/L (ref 135–145)

## 2018-10-07 LAB — BRAIN NATRIURETIC PEPTIDE: B Natriuretic Peptide: 126.2 pg/mL — ABNORMAL HIGH (ref 0.0–100.0)

## 2018-10-07 NOTE — Addendum Note (Signed)
Encounter addended by: Marlise Eves, RN on: 10/07/2018 10:49 AM  Actions taken: Visit diagnoses modified, Order list changed, Diagnosis association updated, Sign clinical note

## 2018-10-07 NOTE — Patient Instructions (Signed)
Labs done today  Follow up with Dr. Bensimhon in 6 months 

## 2018-10-07 NOTE — Progress Notes (Signed)
Advanced Heart Failure Clinic Note   PCP: Dr Rosanna Randy HPI:  Micheal Wright is a 77 y.o. male with a history of coronary artery disease status post coronary artery bypass grafting in 1998.  Last catheterization was in January 2002 due to an abnormal stress test with 4 mm of ST elevation during exercise.  Cardiac catheterization showed patent grafts.  He has a history of hypertension, PAF, hyperlipidemia, and sleep apnea. Myoview 2/09 EF 61% No ischemia.   Had episode PAF in 12/16 during colonoscopy. Underwent DC-CV. Started Eliquis.   Today he returns for HF follow up. Feels pretty good. Able to do all activities without too much problem. Mild SOB if lifting things. No CP, edema.  Sleep Study  04/03/2018 . CPAP Titration completed 04/14/2018   PYP 03/26/2018   Suggestive of Amyloid Anterior planar imaging demonstrates radiotracer uptake within the heart greater than uptake within the adjacent ribs (Grade 2). Quantitative assessment : Quantitative assessment of the cardiac uptake compared to the contralateral chest wall is equal to (H/CL = 1.14). SPECT assessment: SPECT imaging of the chest demonstrates clear radiotracer accumulation within the LEFT ventricle. 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  cMRI 5/19: EF 52% mid myocardial gadolinium uptake on delayed inversion recovery sequences in the septum, apex and inferior base suggesting. Reviewed by Drs. Meda Coffee and [place] and not felt c/w amyloid  Echo 03/02/2018  LVEF 55-60% Mild RV dilation. Mild to mod TR. RVSP not elevated on echo. Personally reviewed  Echo 01/2015 EF 60-65%, no AR, Mild MR/TR, PA peak pressure 33 mmHg, GLPSS -21% LA size 3.8cm Carotid US 01/2015 with minimal plaque bilaterally.  Carotid u/s 11/2012  R ok 40-59% (stable).                       3/16: 1-39% bilaterally ABI 12/2009  R 1.0  L 1.1  Lipids checked with 5/18 Total Cholesterol: 94 TG 98 HDL  32 LDL 42   Review of systems complete and found to be negative unless listed in HPI.    Past Medical History:  Diagnosis Date  . APC (atrial premature contractions) 05/04/2014  . 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 Atherosclerosis of coronary artery 11/08/2008   All risk factors treated.   . Atrial complex, premature 05/04/2014  . CAD (coronary artery disease)    s/p CABG 1998; Last cath 2002 in setting of marked ST elevation during stress testing. patent grafts; Echo 8/09 EF 55%. No significant valvular disease.  . Cardiac murmur 09/03/2011   Overview:  Overview:  Last Assessment & Plan:  Likely mild aortic valve sclerosis. Will check echo.    . Carotid artery disease ([place])    u/s 9/10: R 40-59% L 0-39%  . Carotid artery obstruction 08/22/2009   Overview:  Overview:  Qualifier: Diagnosis of  By: Haroldine Laws, MD, Eileen Stanford  Last Assessment & Plan:  Asymptomatic. Due for repeat u/s.  Overview:  Overview:  Qualifier: Diagnosis of  By: Haroldine Laws, MD, Eileen Stanford  Last Assessment & Plan:  Asymptomatic. Due for repeat u/s.    . Carotid stenosis 08/22/2009   Qualifier: Diagnosis of  By: Haroldine Laws, MD, Eileen Stanford Cerebrovascular disease 11/17/2008  . Chest tightness 01/20/2015  . Chronic atrial fibrillation 03/02/2018  . Coronary artery disease 05/11/2007   Qualifier: Diagnosis of  By: Lurlean Nanny LPN, Regina    . Erectile dysfunction   . Essential hypertension  05/11/2007   Overview:  Overview:  Overview:  Qualifier: Diagnosis of  By: Lurlean Nanny LPN, Regina   Last Assessment & Plan:  BP up slightly here but when he checks [employer] usually 120/65. Will continue current regimen.  Qualifier: Diagnosis of  By: Lurlean Nanny LPN, Regina     . GERD (gastroesophageal reflux disease)   . History of colonic polyps   . Hypercholesteremia 11/08/2008  . Hyperlipidemia   . Hypertension   . Murmur, cardiac 09/03/2011  . PAC (premature atrial contraction) 05/04/2014  . Postsurgical aortocoronary bypass status  01/02/2010  . Sleep apnea     Current Outpatient Medications  Medication Sig Dispense Refill  . amLODipine (NORVASC) 5 MG tablet TAKE 1 TABLET BY MOUTH ONCE DAILY 30 tablet 5  . atorvastatin (LIPITOR) 80 MG tablet TAKE 1 TABLET BY MOUTH ONCE A DAY 30 tablet 5  . ELIQUIS 5 MG TABS tablet TAKE 1 TABLET BY MOUTH TWICE (2) DAILY 180 tablet 0  . losartan (COZAAR) 50 MG tablet TAKE 1 TABLET BY MOUTH ONCE A DAY 30 tablet 3  . NITROSTAT 0.4 MG SL tablet DISSOLVE 1 TABLET UNDER THE TONGUE FOR CHEST PAIN. MAY REPEAT EVERY 5MINUTES UP TO 3 DOSES. IF NO RELIEF, CALL 911** 25 tablet 3  . omeprazole (PRILOSEC) 40 MG capsule TAKE 1 CAPSULE BY MOUTH ONCE DAILY 30 capsule 12  . hydrochlorothiazide (MICROZIDE) 12.5 MG capsule Take 1 capsule (12.5 mg total) by mouth daily. 30 capsule 11   No current facility-administered medications for this encounter.    Vitals:   10/07/18 0959  BP: (!) 111/55  Pulse: (!) 55  SpO2: 100%  Weight: 90.6 kg (199 lb 12.8 oz)   Wt Readings from Last 3 Encounters:  10/07/18 90.6 kg (199 lb 12.8 oz)  08/10/18 91.2 kg (201 lb)  07/30/18 92 kg (202 lb 12.8 oz)    PHYSICAL EXAM: General:  Well appearing. No resp difficulty HEENT: normal Neck: supple. no JVD. Carotids 2+ bilat; no bruits. No lymphadenopathy or thryomegaly appreciated. Cor: PMI nondisplaced. Irregular rate & rhythm. No rubs, gallops or murmurs. Lungs: clear Abdomen: soft, nontender, nondistended. No hepatosplenomegaly. No bruits or masses. Good bowel sounds. Extremities: no cyanosis, clubbing, rash, edema Neuro: alert & orientedx3, cranial nerves grossly intact. moves all 4 extremities w/o difficulty. Affect pleasant   ASSESSMENT & PLAN:  1) LE edema - Resolved with addition of HCTZ - likely mixed picture between dependent edema (with LE varicosities) but also has some RV strain on echo and MRI  - unable to use spiro due to hyperkalemia.  -PYP suggestive of amyloid Grade 2. Quanitive 1.14 - cMRI 5/19:  LVEF 52% mid myocardial gadolinium uptake on delayed inversion recovery sequences in the septum, apex and inferior base suggesting. RV 28%  Reviewed by Drs. Meda Coffee and [place] and not felt c/w amyloid  - Overall doing great but I am still not completely convinced that he doesn't have TTR amyloid. I will repeat PYP scan in 6 months for comparison. RV dysfunction may be related to OSA (but this is mild) or previous CAD 2) Chronic a fib.  - Rate controlled. Denies bleeding on Eliquis. No bleeding  - CHADS VASC 4 (age =2. CAD, HTN).  3) CAD, s/p CABG 1998 - No s/s of ischemia - Myoview 3/16 OK.  4) HTN - Improved with HCTZ. SBP at home 120-140 - Continue losartan 50 mg daily - Continue amlodipine 5 mg daily. -  Echo 03/02/2018 LVEF 55-60%, and mild RV dysfunction.  5) HL - Managed by Dr. Rosanna Randy.  6) Bradycardia - BB previously stopped. Stable.  7) Carotid stenosis, mild - US Carotid 05/28/2017 Bilateral 1-39%. Will follow up prn.  8) Sleep Apnea - Completed sleep study 5/32019 Mild OSA AHI 6.1 - Follows with Dr. Radford Pax. The PAP download was reviewed today and showed an AHI of 4/hr on 5 cm H2O with 97% compliance in using more than 4 hours nightly.   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 Follow.   [name], MD  10:28 AM

## 2018-10-07 NOTE — Addendum Note (Signed)
Encounter addended by: Marlise Eves, RN on: 10/07/2018 10:50 AM  Actions taken: Charge Capture section accepted

## 2018-10-26 ENCOUNTER — Other Ambulatory Visit (HOSPITAL_COMMUNITY): Payer: Self-pay | Admitting: Internal Medicine

## 2018-12-10 DIAGNOSIS — Z8371 Family history of colonic polyps: Secondary | ICD-10-CM | POA: Diagnosis not present

## 2018-12-10 DIAGNOSIS — M6530 Trigger finger, unspecified finger: Secondary | ICD-10-CM | POA: Insufficient documentation

## 2018-12-10 DIAGNOSIS — D649 Anemia, unspecified: Secondary | ICD-10-CM | POA: Diagnosis not present

## 2018-12-10 DIAGNOSIS — Z86010 Personal history of colonic polyps: Secondary | ICD-10-CM | POA: Diagnosis not present

## 2018-12-10 DIAGNOSIS — R140 Abdominal distension (gaseous): Secondary | ICD-10-CM | POA: Diagnosis not present

## 2018-12-10 DIAGNOSIS — K219 Gastro-esophageal reflux disease without esophagitis: Secondary | ICD-10-CM | POA: Diagnosis not present

## 2018-12-10 DIAGNOSIS — R109 Unspecified abdominal pain: Secondary | ICD-10-CM | POA: Diagnosis not present

## 2018-12-10 DIAGNOSIS — R1084 Generalized abdominal pain: Secondary | ICD-10-CM | POA: Diagnosis not present

## 2018-12-11 ENCOUNTER — Other Ambulatory Visit: Payer: Self-pay | Admitting: Nurse Practitioner

## 2018-12-11 DIAGNOSIS — R1084 Generalized abdominal pain: Secondary | ICD-10-CM

## 2018-12-22 ENCOUNTER — Ambulatory Visit
Admission: RE | Admit: 2018-12-22 | Discharge: 2018-12-22 | Disposition: A | Discharge status: Home / Self Care | Payer: Medicare Other | Source: Ambulatory Visit | Attending: Nurse Practitioner | Admitting: Nurse Practitioner

## 2018-12-22 DIAGNOSIS — R1084 Generalized abdominal pain: Secondary | ICD-10-CM | POA: Diagnosis not present

## 2018-12-22 DIAGNOSIS — D3501 Benign neoplasm of right adrenal gland: Secondary | ICD-10-CM | POA: Diagnosis not present

## 2018-12-22 HISTORY — DX: Heart failure, unspecified: I50.9

## 2018-12-22 MED ORDER — IOPAMIDOL (ISOVUE-300) INJECTION 61%
100.0000 mL | Freq: Once | INTRAVENOUS | Status: AC | PRN
  Administered 2018-12-22: 100 mL via INTRAVENOUS

## 2018-12-28 ENCOUNTER — Other Ambulatory Visit: Payer: Self-pay | Admitting: Internal Medicine

## 2018-12-30 ENCOUNTER — Telehealth (HOSPITAL_COMMUNITY): Payer: Self-pay

## 2018-12-30 NOTE — Telephone Encounter (Signed)
Surgical Risk assessment/Clearance and pt records faxed 12/29/18 to [facility] Gastroenterology dept at [phone]

## 2019-01-12 DIAGNOSIS — H903 Sensorineural hearing loss, bilateral: Secondary | ICD-10-CM | POA: Diagnosis not present

## 2019-01-18 DIAGNOSIS — R748 Abnormal levels of other serum enzymes: Secondary | ICD-10-CM | POA: Diagnosis not present

## 2019-01-25 ENCOUNTER — Other Ambulatory Visit (HOSPITAL_COMMUNITY): Payer: Self-pay | Admitting: Internal Medicine

## 2019-01-29 ENCOUNTER — Encounter: Payer: Self-pay | Admitting: *Deleted

## 2019-02-01 ENCOUNTER — Encounter: Payer: Self-pay | Admitting: Anesthesiology

## 2019-02-01 ENCOUNTER — Encounter
Admission: RE | Disposition: A | Discharge status: Home / Self Care | Payer: Self-pay | Source: Home / Self Care | Attending: Unknown Physician Specialty

## 2019-02-01 ENCOUNTER — Ambulatory Visit
Admission: RE | Admit: 2019-02-01 | Discharge: 2019-02-01 | Disposition: A | Discharge status: Home / Self Care | Payer: Medicare Other | Attending: Unknown Physician Specialty | Admitting: Unknown Physician Specialty

## 2019-02-01 ENCOUNTER — Ambulatory Visit: Payer: Medicare Other | Admitting: Anesthesiology

## 2019-02-01 DIAGNOSIS — K228 Other specified diseases of esophagus: Secondary | ICD-10-CM | POA: Insufficient documentation

## 2019-02-01 DIAGNOSIS — G709 Myoneural disorder, unspecified: Secondary | ICD-10-CM | POA: Diagnosis not present

## 2019-02-01 DIAGNOSIS — K219 Gastro-esophageal reflux disease without esophagitis: Secondary | ICD-10-CM | POA: Insufficient documentation

## 2019-02-01 DIAGNOSIS — E785 Hyperlipidemia, unspecified: Secondary | ICD-10-CM | POA: Diagnosis not present

## 2019-02-01 DIAGNOSIS — I110 Hypertensive heart disease with heart failure: Secondary | ICD-10-CM | POA: Diagnosis not present

## 2019-02-01 DIAGNOSIS — Z951 Presence of aortocoronary bypass graft: Secondary | ICD-10-CM | POA: Diagnosis not present

## 2019-02-01 DIAGNOSIS — G4730 Sleep apnea, unspecified: Secondary | ICD-10-CM | POA: Insufficient documentation

## 2019-02-01 DIAGNOSIS — K2970 Gastritis, unspecified, without bleeding: Secondary | ICD-10-CM | POA: Diagnosis not present

## 2019-02-01 DIAGNOSIS — D126 Benign neoplasm of colon, unspecified: Secondary | ICD-10-CM | POA: Diagnosis not present

## 2019-02-01 DIAGNOSIS — R1084 Generalized abdominal pain: Secondary | ICD-10-CM | POA: Insufficient documentation

## 2019-02-01 DIAGNOSIS — E7800 Pure hypercholesterolemia, unspecified: Secondary | ICD-10-CM | POA: Diagnosis not present

## 2019-02-01 DIAGNOSIS — R140 Abdominal distension (gaseous): Secondary | ICD-10-CM | POA: Diagnosis not present

## 2019-02-01 DIAGNOSIS — D122 Benign neoplasm of ascending colon: Secondary | ICD-10-CM | POA: Diagnosis not present

## 2019-02-01 DIAGNOSIS — Z7901 Long term (current) use of anticoagulants: Secondary | ICD-10-CM | POA: Diagnosis not present

## 2019-02-01 DIAGNOSIS — Z79899 Other long term (current) drug therapy: Secondary | ICD-10-CM | POA: Diagnosis not present

## 2019-02-01 DIAGNOSIS — K3189 Other diseases of stomach and duodenum: Secondary | ICD-10-CM | POA: Diagnosis not present

## 2019-02-01 DIAGNOSIS — Z86010 Personal history of colonic polyps: Secondary | ICD-10-CM | POA: Diagnosis not present

## 2019-02-01 DIAGNOSIS — I2510 Atherosclerotic heart disease of native coronary artery without angina pectoris: Secondary | ICD-10-CM | POA: Diagnosis not present

## 2019-02-01 DIAGNOSIS — K2950 Unspecified chronic gastritis without bleeding: Secondary | ICD-10-CM | POA: Diagnosis not present

## 2019-02-01 DIAGNOSIS — K635 Polyp of colon: Secondary | ICD-10-CM | POA: Diagnosis not present

## 2019-02-01 DIAGNOSIS — Z1211 Encounter for screening for malignant neoplasm of colon: Secondary | ICD-10-CM | POA: Insufficient documentation

## 2019-02-01 DIAGNOSIS — I509 Heart failure, unspecified: Secondary | ICD-10-CM | POA: Insufficient documentation

## 2019-02-01 DIAGNOSIS — Z538 Procedure and treatment not carried out for other reasons: Secondary | ICD-10-CM | POA: Diagnosis not present

## 2019-02-01 DIAGNOSIS — D123 Benign neoplasm of transverse colon: Secondary | ICD-10-CM | POA: Diagnosis not present

## 2019-02-01 DIAGNOSIS — K2960 Other gastritis without bleeding: Secondary | ICD-10-CM | POA: Diagnosis not present

## 2019-02-01 HISTORY — PX: ESOPHAGOGASTRODUODENOSCOPY: SHX5428

## 2019-02-01 HISTORY — PX: COLONOSCOPY WITH PROPOFOL: SHX5780

## 2019-02-01 SURGERY — EGD (ESOPHAGOGASTRODUODENOSCOPY)
Anesthesia: General

## 2019-02-01 MED ORDER — EPHEDRINE SULFATE 50 MG/ML IJ SOLN
INTRAMUSCULAR | Status: DC | PRN
  Administered 2019-02-01 (×5): 5 mg via INTRAVENOUS

## 2019-02-01 MED ORDER — LIDOCAINE HCL (CARDIAC) PF 100 MG/5ML IV SOSY
PREFILLED_SYRINGE | INTRAVENOUS | Status: DC | PRN
  Administered 2019-02-01: 50 mg via INTRAVENOUS

## 2019-02-01 MED ORDER — FENTANYL CITRATE (PF) 100 MCG/2ML IJ SOLN
INTRAMUSCULAR | Status: DC | PRN
  Administered 2019-02-01: 50 ug via INTRAVENOUS

## 2019-02-01 MED ORDER — FENTANYL CITRATE (PF) 100 MCG/2ML IJ SOLN
INTRAMUSCULAR | Status: AC
  Filled 2019-02-01: qty 2

## 2019-02-01 MED ORDER — PROPOFOL 500 MG/50ML IV EMUL
INTRAVENOUS | Status: AC
  Filled 2019-02-01: qty 50

## 2019-02-01 MED ORDER — EPHEDRINE SULFATE 50 MG/ML IJ SOLN
INTRAMUSCULAR | Status: AC
  Filled 2019-02-01: qty 1

## 2019-02-01 MED ORDER — LIDOCAINE HCL (PF) 2 % IJ SOLN
INTRAMUSCULAR | Status: AC
  Filled 2019-02-01: qty 10

## 2019-02-01 MED ORDER — PROPOFOL 500 MG/50ML IV EMUL
INTRAVENOUS | Status: DC | PRN
  Administered 2019-02-01: 120 ug/kg/min via INTRAVENOUS

## 2019-02-01 MED ORDER — MIDAZOLAM HCL 2 MG/2ML IJ SOLN
INTRAMUSCULAR | Status: AC
  Filled 2019-02-01: qty 2

## 2019-02-01 MED ORDER — PROPOFOL 10 MG/ML IV BOLUS
INTRAVENOUS | Status: AC
  Filled 2019-02-01: qty 20

## 2019-02-01 MED ORDER — SODIUM CHLORIDE 0.9 % IV SOLN
INTRAVENOUS | Status: DC
  Administered 2019-02-01: 11:00:00 via INTRAVENOUS

## 2019-02-01 MED ORDER — SODIUM CHLORIDE 0.9 % IV SOLN: INTRAVENOUS | Status: DC

## 2019-02-01 MED ORDER — MIDAZOLAM HCL 2 MG/2ML IJ SOLN
INTRAMUSCULAR | Status: DC | PRN
  Administered 2019-02-01: 1 mg via INTRAVENOUS

## 2019-02-01 NOTE — Transfer of Care (Signed)
Immediate Anesthesia Transfer of Care Note  Patient: Micheal Wright  Procedure(s) Performed: ESOPHAGOGASTRODUODENOSCOPY (EGD) (N/A ) COLONOSCOPY WITH PROPOFOL (N/A )  Patient Location: PACU  Anesthesia Type:General  Level of Consciousness: awake and sedated  Airway & Oxygen Therapy: Patient Spontanous Breathing and Patient connected to face mask oxygen  Post-op Assessment: Report given to RN and Post -op Vital signs reviewed and stable  Post vital signs: Reviewed and stable  Last Vitals:  Vitals Value Taken Time  BP    Temp    Pulse    Resp    SpO2      Last Pain:  Vitals:   02/01/19 1104  TempSrc: Oral  PainSc: 0-No pain         Complications: No apparent anesthesia complications

## 2019-02-01 NOTE — Anesthesia Procedure Notes (Signed)
Performed by: Cook-Martin, Charmian Forbis Pre-anesthesia Checklist: Patient identified, Emergency Drugs available, Suction available, Patient being monitored and Timeout performed Patient Re-evaluated:Patient Re-evaluated prior to induction Oxygen Delivery Method: Nasal cannula Preoxygenation: Pre-oxygenation with 100% oxygen Induction Type: IV induction Airway Equipment and Method: Bite block Placement Confirmation: positive ETCO2 and CO2 detector       

## 2019-02-01 NOTE — Op Note (Signed)
[facility] Patient Name: Micheal Wright Procedure Date: 02/01/2019 12:07 PM MRN: [id number] Account #: [id number] Date of Birth: 1941/11/20 Admit Type: Outpatient Age: 78 Room: [facility] ENDO ROOM 1 Gender: Male Note Status: Finalized Procedure:            Upper GI endoscopy Indications:          Generalized abdominal pain Providers:            Manya Silvas, MD Referring MD:         Janine Ores. Rosanna Randy, MD (Referring MD) Medicines:            Propofol per Anesthesia Complications:        No immediate complications. Procedure:            Pre-Anesthesia Assessment:                       - After reviewing the risks and benefits, the patient                        was deemed in satisfactory condition to undergo the                        procedure.                       After obtaining informed consent, the endoscope was                        passed under direct vision. Throughout the procedure,                        the patient's blood pressure, pulse, and oxygen                        saturations were monitored continuously. The Endoscope                        was introduced through the mouth, and advanced to the                        second part of duodenum. The upper GI endoscopy was                        accomplished without difficulty. The patient tolerated                        the procedure well. Findings:      The examined esophagus was normal. GEJ 41cm.      Diffuse mildly erythematous mucosa without bleeding was found in the       gastric antrum. Biopsies were taken with a cold forceps for histology.       Biopsies were taken with a cold forceps for Helicobacter pylori testing.       The body of the      stomach was very nodular.      The examined duodenum was normal. Impression:           - Normal esophagus.                       - Erythematous mucosa in the antrum. Biopsied.                       -  Normal examined  duodenum. Recommendation:       - Await pathology results. Manya Silvas, MD 02/01/2019 12:38:57 PM This report has been signed electronically. Number of Addenda: 0 Note Initiated On: 02/01/2019 12:07 PM      [facility]

## 2019-02-01 NOTE — Anesthesia Post-op Follow-up Note (Signed)
Anesthesia QCDR form completed.        

## 2019-02-01 NOTE — Anesthesia Postprocedure Evaluation (Signed)
Anesthesia Post Note  Patient: Micheal Wright  Procedure(s) Performed: ESOPHAGOGASTRODUODENOSCOPY (EGD) (N/A ) COLONOSCOPY WITH PROPOFOL (N/A )  Patient location during evaluation: Endoscopy Anesthesia Type: General Level of consciousness: awake and alert Pain management: pain level controlled Vital Signs Assessment: post-procedure vital signs reviewed and stable Respiratory status: spontaneous breathing, nonlabored ventilation, respiratory function stable and patient connected to nasal cannula oxygen Cardiovascular status: blood pressure returned to baseline and stable Postop Assessment: no apparent nausea or vomiting Anesthetic complications: no     Last Vitals:  Vitals:   02/01/19 1339 02/01/19 1349  BP: 109/61 104/79  Pulse: (!) 56 72  Resp: (!) 24 17  Temp:    SpO2: 100% 100%    Last Pain:  Vitals:   02/01/19 1349  TempSrc:   PainSc: 0-No pain                 Tylerjames Hoglund S

## 2019-02-01 NOTE — Anesthesia Preprocedure Evaluation (Signed)
Anesthesia Evaluation  Patient identified by MRN, date of birth, ID band Patient awake    Reviewed: Allergy & Precautions, NPO status , Patient's Chart, lab work & pertinent test results, reviewed documented beta blocker date and time   Airway Mallampati: III  TM Distance: >3 FB     Dental  (+) Chipped, Upper Dentures, Partial Lower   Pulmonary sleep apnea ,           Cardiovascular hypertension, Pt. on medications + CAD and +CHF  + Valvular Problems/Murmurs      Neuro/Psych  Neuromuscular disease    GI/Hepatic GERD  ,  Endo/Other    Renal/GU      Musculoskeletal   Abdominal   Peds  Hematology   Anesthesia Other Findings No stents. Has not had UPPP.  Reproductive/Obstetrics                             Anesthesia Physical Anesthesia Plan  ASA: III  Anesthesia Plan: General   Post-op Pain Management:    Induction: Intravenous  PONV Risk Score and Plan:   Airway Management Planned:   Additional Equipment:   Intra-op Plan:   Post-operative Plan:   Informed Consent: I have reviewed the patients History and Physical, chart, labs and discussed the procedure including the risks, benefits and alternatives for the proposed anesthesia with the patient or authorized representative who has indicated his/her understanding and acceptance.       Plan Discussed with: CRNA  Anesthesia Plan Comments:         Anesthesia Quick Evaluation

## 2019-02-01 NOTE — H&P (Signed)
Primary Care Physician:  Jerrol Banana., MD Primary Gastroenterologist:  Dr. Vira Agar  Pre-Procedure History & Physical: HPI:  Micheal Wright is a 78 y.o. male is here for an endoscopy and colonoscopy.   Past Medical History:  Diagnosis Date  . APC (atrial premature contractions) 05/04/2014  . 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 Atherosclerosis of coronary artery 11/08/2008   All risk factors treated.   . Atrial complex, premature 05/04/2014  . CAD (coronary artery disease)    s/p CABG 1998; Last cath 2002 in setting of marked ST elevation during stress testing. patent grafts; Echo 8/09 EF 55%. No significant valvular disease.  . Cardiac murmur 09/03/2011   Overview:  Overview:  Last Assessment & Plan:  Likely mild aortic valve sclerosis. Will check echo.    . Carotid artery disease ([place])    u/s 9/10: R 40-59% L 0-39%  . Carotid artery obstruction 08/22/2009   Overview:  Overview:  Qualifier: Diagnosis of  By: Haroldine Laws, MD, Eileen Stanford  Last Assessment & Plan:  Asymptomatic. Due for repeat u/s.  Overview:  Overview:  Qualifier: Diagnosis of  By: Haroldine Laws, MD, Eileen Stanford  Last Assessment & Plan:  Asymptomatic. Due for repeat u/s.    . Carotid stenosis 08/22/2009   Qualifier: Diagnosis of  By: Haroldine Laws, MD, Eileen Stanford Cerebrovascular disease 11/17/2008  . Chest tightness 01/20/2015  . CHF (congestive heart failure) ([place])   . Chronic atrial fibrillation 03/02/2018  . Coronary artery disease 05/11/2007   Qualifier: Diagnosis of  By: Lurlean Nanny LPN, [place]    . Erectile dysfunction   . Essential hypertension 05/11/2007   Overview:  Overview:  Overview:  Qualifier: Diagnosis of  By: Lurlean Nanny LPN, [place]   Last Assessment & Plan:  BP up slightly here but when he checks [employer] usually 120/65. Will continue current regimen.  Qualifier: Diagnosis of  By: Lurlean Nanny LPN, Regina     . GERD (gastroesophageal reflux disease)   . History of colonic polyps   . Hypercholesteremia 11/08/2008   . Hyperlipidemia   . Hypertension   . Murmur, cardiac 09/03/2011  . PAC (premature atrial contraction) 05/04/2014  . 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 Sleep apnea     Past Surgical History:  Procedure Laterality Date  . ANGIOPLASTY     x 2  . APPENDECTOMY  1960's  . CARDIAC CATHETERIZATION  2002  . CARDIOVERSION N/A 11/14/2015   Procedure: CARDIOVERSION;  Surgeon: Jolaine Artist, MD;  Location: [facility] ENDOSCOPY;  Service: Cardiovascular;  Laterality: N/A;  . COLONOSCOPY W/ POLYPECTOMY    . COLONOSCOPY WITH PROPOFOL N/A 11/13/2015   Procedure: COLONOSCOPY WITH PROPOFOL;  Surgeon: Manya Silvas, MD;  Location: [facility] ENDOSCOPY;  Service: Endoscopy;  Laterality: N/A;  . CORONARY ARTERY BYPASS GRAFT  1998  . ESOPHAGOGASTRODUODENOSCOPY  04/2004   Inflam. at Z line (biopsy neg)  . HERPES SIMPLEX VIRUS DFA N/A   . KNEE ARTHROSCOPY W/ PARTIAL MEDIAL MENISCECTOMY  1990s   Left knee  . MRI    . sleep study    . UVULOPALATOPHARYNGOPLASTY      Prior to Admission medications   Medication Sig Start Date End Date Taking? Authorizing Provider  amLODipine (NORVASC) 5 MG tablet TAKE 1 TABLET BY MOUTH ONCE DAILY 12/28/18  Yes Bensimhon, Shaune Pascal, MD  atorvastatin (LIPITOR) 80 MG tablet TAKE 1 TABLET BY MOUTH ONCE A DAY 09/03/18  Yes Bensimhon, Shaune Pascal, MD  losartan (COZAAR)  50 MG tablet TAKE 1 TABLET BY MOUTH ONCE A DAY 04/08/18  Yes Bensimhon, Shaune Pascal, MD  omeprazole (PRILOSEC) 40 MG capsule TAKE 1 CAPSULE BY MOUTH ONCE DAILY 07/30/18  Yes Jerrol Banana., MD  Sildenafil Citrate (VIAGRA PO) Take by mouth.   Yes [provider]  triamcinolone ointment (KENALOG) 0.1 % Apply 1 application topically 2 (two) times daily.   Yes [provider]  ELIQUIS 5 MG TABS tablet TAKE 1 TABLET BY MOUTH TWICE (2) DAILY 01/25/19   Bensimhon, Shaune Pascal, MD  hydrochlorothiazide (MICROZIDE) 12.5 MG capsule Take 1 capsule (12.5 mg total) by mouth daily. 03/02/18 05/31/18  [name], PA-C  NITROSTAT 0.4 MG SL tablet DISSOLVE 1 TABLET UNDER THE TONGUE FOR CHEST PAIN. MAY REPEAT EVERY 5MINUTES UP TO 3 DOSES. IF NO RELIEF, CALL 911** 08/16/16   Bensimhon, Shaune Pascal, MD    Allergies as of 12/11/2018  . (No Known Allergies)    Family History  Problem Relation Age of Onset  . Stroke Mother   . Heart attack Father   . Alcohol abuse Father   . Breast cancer Sister   . Heart disease Sister   . Rheumatologic disease Brother        Heart  . Heart disease Brother   . Hypertension Neg Hx   . Diabetes Neg Hx   . Prostate cancer Neg Hx   . Colon cancer Neg Hx     Social History   Socioeconomic History  . Marital status: Married    Spouse name: Not on file  . Number of children: 3  . Years of education: Not on file  . Highest education level: 12th grade  Occupational History  . 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 Financial resource strain: Not hard at all  . Food insecurity:    Worry: Never true    Inability: Never true  . Transportation needs:    Medical: No    Non-medical: No  Tobacco Use  . Smoking status: Never Smoker  . Smokeless tobacco: Never Used  Substance and Sexual Activity  . Alcohol use: No  . Drug use: No  . Sexual activity: Never  Lifestyle  . Physical activity:    Days per week: Not on file    Minutes per session: Not on file  . Stress: Not at all  Relationships  . Social connections:    Talks on phone: Not on file    Gets together: Not on file    Attends religious service: Not on file    Active member of club or organization: Not on file    Attends meetings of clubs or organizations: Not on file    Relationship status: Not on file  . Intimate partner violence:    Fear of current or ex partner: Not on file    Emotionally abused: Not on file    Physically abused: Not on file    Forced sexual activity: Not on file  Other Topics Concern  . Not on file  Social History Narrative   Married with 3  children    Review of Systems: See HPI, otherwise negative ROS  Physical Exam: BP 116/61   Pulse 68   Temp 98 F (36.7 C) (Oral)   Resp 16   Ht 5\' 10"  (1.778 m)   Wt 90.7 kg   SpO2 98%   BMI 28.70 kg/m  General:   Alert,  pleasant and cooperative in NAD  Head:  Normocephalic and atraumatic. Neck:  Supple; no masses or thyromegaly. Lungs:  Clear throughout to auscultation.    Heart:  Regular rate and rhythm. Abdomen:  Soft, nontender and nondistended. Normal bowel sounds, without guarding, and without rebound.   Neurologic:  Alert and  oriented x4;  grossly normal neurologically.  Impression/Plan: Micheal Wright is here for an endoscopy and colonoscopy to be performed for [facility] colon polyps and abdominal pain  Risks, benefits, limitations, and alternatives regarding  endoscopy and colonoscopy have been reviewed with the patient.  Questions have been answered.  All parties agreeable.   Gaylyn Cheers, MD  02/01/2019, 12:24 PM

## 2019-02-01 NOTE — Op Note (Signed)
[facility] Gastroenterology Patient Name: Micheal Wright Procedure Date: 02/01/2019 12:06 PM MRN: [id number] Account #: [id number] Date of Birth: 06-Jun-1941 Admit Type: Outpatient Age: 78 Room: [facility] ENDO ROOM 1 Gender: Male Note Status: Finalized Procedure:            Colonoscopy Indications:          Screening for colorectal malignant neoplasm Providers:            Manya Silvas, MD Referring MD:         Janine Ores. Rosanna Randy, MD (Referring MD) Medicines:            Propofol per Anesthesia Complications:        No immediate complications. Procedure:            Pre-Anesthesia Assessment:                       - After reviewing the risks and benefits, the patient                        was deemed in satisfactory condition to undergo the                        procedure.                       After obtaining informed consent, the colonoscope was                        passed under direct vision. Throughout the procedure,                        the patient's blood pressure, pulse, and oxygen                        saturations were monitored continuously. The                        Colonoscope was introduced through the anus and                        advanced to the the ascending colon for evaluation.                        This was the intended extent. The colonoscopy was                        technically difficult and complex due to inadequate                        bowel prep. Successful completion of the procedure was                        aided by applying abdominal pressure. The patient                        tolerated the procedure well. The quality of the bowel                        preparation was adequate to identify polyps 6 mm and  larger in size. Findings:      The colon was very tortuous and it made the exam difficult but we passed       the scopeto the proximal ascending colon. I could see the ileocecal       valve but could not  advance a short way to the cecum.      A small polyp was found in the ascending colon. The polyp was sessile.       The polyp was removed with a jumbo cold forceps. Resection and retrieval       were complete.      A small polyp was found in the hepatic flexure. The polyp was sessile.       The polyp was removed with a cold snare. Resection and retrieval were       complete.      The colon was very long and we filled up the 1500cc cannister. Impression:           - One small polyp in the ascending colon, removed with                        a jumbo cold forceps. Resected and retrieved.                       - One small polyp at the hepatic flexure, removed with                        a cold snare. Resected and retrieved. Recommendation:       - Await pathology results. Manya Silvas, MD 02/01/2019 1:26:41 PM This report has been signed electronically. Number of Addenda: 0 Note Initiated On: 02/01/2019 12:06 PM Scope Withdrawal Time: 0 hours 16 minutes 34 seconds  Total Procedure Duration: 0 hours 35 minutes 1 second       [facility]

## 2019-02-02 LAB — SURGICAL PATHOLOGY

## 2019-02-03 ENCOUNTER — Encounter: Payer: Self-pay | Admitting: Unknown Physician Specialty

## 2019-02-03 ENCOUNTER — Other Ambulatory Visit (HOSPITAL_COMMUNITY): Payer: Self-pay

## 2019-02-03 DIAGNOSIS — I2510 Atherosclerotic heart disease of native coronary artery without angina pectoris: Secondary | ICD-10-CM

## 2019-02-03 MED ORDER — ATORVASTATIN CALCIUM 80 MG PO TABS: 80.0000 mg | ORAL_TABLET | Freq: Every day | ORAL | 5 refills | Status: DC

## 2019-02-08 ENCOUNTER — Ambulatory Visit: Payer: Self-pay | Admitting: Family Medicine

## 2019-02-11 ENCOUNTER — Ambulatory Visit (INDEPENDENT_AMBULATORY_CARE_PROVIDER_SITE_OTHER): Payer: Medicare Other | Admitting: Family Medicine

## 2019-02-11 ENCOUNTER — Other Ambulatory Visit: Payer: Self-pay

## 2019-02-11 VITALS — BP 118/72 | HR 82 | Temp 98.0°F | Resp 16 | Wt 205.0 lb

## 2019-02-11 DIAGNOSIS — M545 Low back pain, unspecified: Secondary | ICD-10-CM

## 2019-02-11 DIAGNOSIS — G8929 Other chronic pain: Secondary | ICD-10-CM

## 2019-02-11 DIAGNOSIS — I482 Chronic atrial fibrillation, unspecified: Secondary | ICD-10-CM

## 2019-02-11 DIAGNOSIS — I2510 Atherosclerotic heart disease of native coronary artery without angina pectoris: Secondary | ICD-10-CM

## 2019-02-11 DIAGNOSIS — I10 Essential (primary) hypertension: Secondary | ICD-10-CM | POA: Diagnosis not present

## 2019-02-11 DIAGNOSIS — G4733 Obstructive sleep apnea (adult) (pediatric): Secondary | ICD-10-CM | POA: Diagnosis not present

## 2019-02-11 NOTE — Progress Notes (Signed)
Micheal Wright  MRN: [id number] DOB: 05-22-1941  Subjective:  HPI   The patient is a 78 year old male who presents for follow up of chronic health.  He was last seen on 08/10/18. Everything is stable.  He is feeling well.  He has no complaints.  He is taking his medications he says and he wears a CPAP every night  Hypertension BP Readings from Last 3 Encounters:  02/11/19 118/72  02/01/19 104/79  10/07/18 (!) 111/55     Patient Active Problem List   Diagnosis Date Noted  . Chronic atrial fibrillation 03/02/2018  . 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 Basal cell carcinoma of skin 10/16/2015  . Benign prostatic hyperplasia with urinary obstruction 10/16/2015  . Neuropathy 10/16/2015  . Chest tightness 01/20/2015  . Posthitis 01/10/2015  . PAC (premature atrial contraction) 05/04/2014  . Atrial complex, premature 05/04/2014  . APC (atrial premature contractions) 05/04/2014  . Blood in semen 08/31/2012  . Murmur, cardiac 09/03/2011  . Cardiac murmur 09/03/2011  . 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 DISTURBANCE OF SKIN SENSATION 12/21/2009  . Carotid stenosis 08/22/2009  . Carotid artery obstruction 08/22/2009  . Low back pain 03/24/2009  . Cerebrovascular disease 11/17/2008  . Atherosclerosis of coronary artery 11/08/2008  . Hypercholesteremia 11/08/2008  . COLD SORE 11/12/2007  . Herpes 11/12/2007  . Hyperlipidemia 05/11/2007  . ERECTILE DYSFUNCTION 05/11/2007  . Essential hypertension 05/11/2007  . Coronary artery disease 05/11/2007  . GERD 05/11/2007  . Sleep apnea 05/11/2007  . COLONIC POLYPS, HX OF 05/11/2007  . Gastro-esophageal reflux disease without esophagitis 05/11/2007  . History of colon polyps 05/11/2007    Past Medical History:  Diagnosis Date  . APC (atrial premature contractions) 05/04/2014  . 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 Atherosclerosis of  coronary artery 11/08/2008   All risk factors treated.   . Atrial complex, premature 05/04/2014  . CAD (coronary artery disease)    s/p CABG 1998; Last cath 2002 in setting of marked ST elevation during stress testing. patent grafts; Echo 8/09 EF 55%. No significant valvular disease.  . Cardiac murmur 09/03/2011   Overview:  Overview:  Last Assessment & Plan:  Likely mild aortic valve sclerosis. Will check echo.    . Carotid artery disease ([place])    u/s 9/10: R 40-59% L 0-39%  . Carotid artery obstruction 08/22/2009   Overview:  Overview:  Qualifier: Diagnosis of  By: Haroldine Laws, MD, Eileen Stanford  Last Assessment & Plan:  Asymptomatic. Due for repeat u/s.  Overview:  Overview:  Qualifier: Diagnosis of  By: Haroldine Laws, MD, Eileen Stanford  Last Assessment & Plan:  Asymptomatic. Due for repeat u/s.    . Carotid stenosis 08/22/2009   Qualifier: Diagnosis of  By: Haroldine Laws, MD, Eileen Stanford Cerebrovascular disease 11/17/2008  . Chest tightness 01/20/2015  . CHF (congestive heart failure) ([place])   . Chronic atrial fibrillation 03/02/2018  . Coronary artery disease 05/11/2007   Qualifier: Diagnosis of  By: Lurlean Nanny LPN, Reg[place]    . Erectile dysfunction   . Essential hypertension 05/11/2007   Overview:  Overview:  Overview:  Qualifier: Diagnosis of  By: Lurlean Nanny LPN, Regina   Last Assessment & Plan:  BP up slightly here but when he checks [employer] usually 120/65. Will continue current regimen.  Qualifier: Diagnosis of  By: Lurlean Nanny LPN, Regina     . GERD (gastroesophageal reflux  disease)   . History of colonic polyps   . Hypercholesteremia 11/08/2008  . Hyperlipidemia   . Hypertension   . Murmur, cardiac 09/03/2011  . PAC (premature atrial contraction) 05/04/2014  . 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 Sleep apnea     Social History   Socioeconomic History  . Marital status: Married    Spouse name: Not on file  . Number of children: 3  . Years of education: Not on file  . Highest education  level: 12th grade  Occupational History  . 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 Financial resource strain: Not hard at all  . Food insecurity:    Worry: Never true    Inability: Never true  . Transportation needs:    Medical: No    Non-medical: No  Tobacco Use  . Smoking status: Never Smoker  . Smokeless tobacco: Never Used  Substance and Sexual Activity  . Alcohol use: No  . Drug use: No  . Sexual activity: Never  Lifestyle  . Physical activity:    Days per week: Not on file    Minutes per session: Not on file  . Stress: Not at all  Relationships  . Social connections:    Talks on phone: Not on file    Gets together: Not on file    Attends religious service: Not on file    Active member of club or organization: Not on file    Attends meetings of clubs or organizations: Not on file    Relationship status: Not on file  . Intimate partner violence:    Fear of current or ex partner: Not on file    Emotionally abused: Not on file    Physically abused: Not on file    Forced sexual activity: Not on file  Other Topics Concern  . Not on file  Social History Narrative   Married with 3 children    Outpatient Encounter Medications as of 02/11/2019  Medication Sig  . amLODipine (NORVASC) 5 MG tablet TAKE 1 TABLET BY MOUTH ONCE DAILY  . atorvastatin (LIPITOR) 80 MG tablet Take 1 tablet (80 mg total) by mouth daily.  Marland Kitchen ELIQUIS 5 MG TABS tablet TAKE 1 TABLET BY MOUTH TWICE (2) DAILY  . hydrochlorothiazide (MICROZIDE) 12.5 MG capsule Take 1 capsule (12.5 mg total) by mouth daily.  Marland Kitchen losartan (COZAAR) 50 MG tablet TAKE 1 TABLET BY MOUTH ONCE A DAY  . NITROSTAT 0.4 MG SL tablet DISSOLVE 1 TABLET UNDER THE TONGUE FOR CHEST PAIN. MAY REPEAT EVERY 5MINUTES UP TO 3 DOSES. IF NO RELIEF, CALL 911**  . omeprazole (PRILOSEC) 40 MG capsule TAKE 1 CAPSULE BY MOUTH ONCE DAILY  . Sildenafil Citrate (VIAGRA PO) Take by mouth.  . triamcinolone ointment (KENALOG) 0.1 % Apply 1  application topically 2 (two) times daily.   No facility-administered encounter medications on file as of 02/11/2019.     No Known Allergies  Review of Systems  Constitutional: Negative.   Eyes: Negative.   Respiratory: Negative for cough, shortness of breath and wheezing.   Cardiovascular: Negative for chest pain, palpitations and leg swelling.  Gastrointestinal: Negative.   Genitourinary: Negative.   Skin: Negative.   Neurological: Negative.   Endo/Heme/Allergies: Negative.   Psychiatric/Behavioral: Negative.     Objective:  BP 118/72 (BP Location: Right Arm, Patient Position: Sitting, Cuff Size: Normal)   Pulse 82   Temp 98 F (36.7 C) (Oral)   Resp 16   Wt 205  lb (93 kg)   SpO2 98%   BMI 29.41 kg/m   Physical Exam  Constitutional: He is oriented to person, place, and time and well-developed, well-nourished, and in no distress.  HENT:  Head: Normocephalic and atraumatic.  Right Ear: External ear normal.  Left Ear: External ear normal.  Nose: Nose normal.  Eyes: Conjunctivae are normal. No scleral icterus.  Neck: No thyromegaly present.  Cardiovascular: Normal rate, regular rhythm and normal heart sounds.  Pulmonary/Chest: Effort normal and breath sounds normal.  Abdominal: Soft.  Musculoskeletal:        General: No edema.  Neurological: He is alert and oriented to person, place, and time. Gait normal. GCS score is 15.  Skin: Skin is warm and dry.  Psychiatric: Mood, memory, affect and judgment normal.    Assessment and Plan :  1. Essential hypertension Trolled on losartan. - CBC with Differential/Platelet  2. Chronic atrial fibrillation On Eliquis  3. Obstructive sleep apnea syndrome Uses CPAP nightly  4. Chronic low back pain, unspecified back pain laterality, unspecified whether sciatica present  5.  Hyperlipidemia Lipitor.  f/u this summer  I have done the exam and reviewed the chart and it is accurate to the best of my knowledge. Risk manager has been used and  any errors in dictation or transcription are unintentional. Miguel Aschoff M.D. [facility]

## 2019-02-15 DIAGNOSIS — H903 Sensorineural hearing loss, bilateral: Secondary | ICD-10-CM | POA: Diagnosis not present

## 2019-02-16 DIAGNOSIS — I10 Essential (primary) hypertension: Secondary | ICD-10-CM | POA: Diagnosis not present

## 2019-02-17 LAB — CBC WITH DIFFERENTIAL/PLATELET
Basophils Absolute: 0 10*3/uL (ref 0.0–0.2)
Basos: 1 %
EOS (ABSOLUTE): 0.1 10*3/uL (ref 0.0–0.4)
EOS: 2 %
Hematocrit: 36 % — ABNORMAL LOW (ref 37.5–51.0)
Hemoglobin: 12.7 g/dL — ABNORMAL LOW (ref 13.0–17.7)
IMMATURE GRANULOCYTES: 0 %
Immature Grans (Abs): 0 10*3/uL (ref 0.0–0.1)
Lymphocytes Absolute: 2.3 10*3/uL (ref 0.7–3.1)
Lymphs: 42 %
MCH: 33.3 pg — ABNORMAL HIGH (ref 26.6–33.0)
MCHC: 35.3 g/dL (ref 31.5–35.7)
MCV: 95 fL (ref 79–97)
MONOS ABS: 0.6 10*3/uL (ref 0.1–0.9)
Monocytes: 12 %
NEUTROS PCT: 43 %
Neutrophils Absolute: 2.2 10*3/uL (ref 1.4–7.0)
Platelets: 239 10*3/uL (ref 150–450)
RBC: 3.81 x10E6/uL — ABNORMAL LOW (ref 4.14–5.80)
RDW: 12.6 % (ref 11.6–15.4)
WBC: 5.2 10*3/uL (ref 3.4–10.8)

## 2019-02-25 DIAGNOSIS — E278 Other specified disorders of adrenal gland: Secondary | ICD-10-CM | POA: Insufficient documentation

## 2019-02-25 DIAGNOSIS — E279 Disorder of adrenal gland, unspecified: Secondary | ICD-10-CM | POA: Diagnosis not present

## 2019-02-26 ENCOUNTER — Other Ambulatory Visit (HOSPITAL_COMMUNITY): Payer: Self-pay | Admitting: Student

## 2019-02-26 DIAGNOSIS — E279 Disorder of adrenal gland, unspecified: Secondary | ICD-10-CM | POA: Diagnosis not present

## 2019-03-01 DIAGNOSIS — D649 Anemia, unspecified: Secondary | ICD-10-CM | POA: Diagnosis not present

## 2019-03-01 DIAGNOSIS — R194 Change in bowel habit: Secondary | ICD-10-CM | POA: Insufficient documentation

## 2019-03-01 DIAGNOSIS — R748 Abnormal levels of other serum enzymes: Secondary | ICD-10-CM | POA: Insufficient documentation

## 2019-03-01 DIAGNOSIS — R17 Unspecified jaundice: Secondary | ICD-10-CM | POA: Diagnosis not present

## 2019-03-01 DIAGNOSIS — N400 Enlarged prostate without lower urinary tract symptoms: Secondary | ICD-10-CM | POA: Insufficient documentation

## 2019-03-01 DIAGNOSIS — K219 Gastro-esophageal reflux disease without esophagitis: Secondary | ICD-10-CM | POA: Diagnosis not present

## 2019-03-03 DIAGNOSIS — N400 Enlarged prostate without lower urinary tract symptoms: Secondary | ICD-10-CM | POA: Diagnosis not present

## 2019-03-03 DIAGNOSIS — D649 Anemia, unspecified: Secondary | ICD-10-CM | POA: Diagnosis not present

## 2019-03-03 DIAGNOSIS — R748 Abnormal levels of other serum enzymes: Secondary | ICD-10-CM | POA: Diagnosis not present

## 2019-03-08 ENCOUNTER — Telehealth: Payer: Self-pay | Admitting: Licensed Clinical Social Worker

## 2019-03-08 ENCOUNTER — Encounter: Payer: Self-pay | Admitting: Licensed Clinical Social Worker

## 2019-03-08 NOTE — Telephone Encounter (Signed)
A genetic counseling appt has ben scheduled for the Micheal Wright to see Faith Rogue on 6/18 at 2pm. Letter mailed to the Micheal Wright and faxed to the referring to notify the Micheal Wright.

## 2019-04-29 ENCOUNTER — Other Ambulatory Visit: Payer: Self-pay | Admitting: Internal Medicine

## 2019-05-04 ENCOUNTER — Ambulatory Visit (INDEPENDENT_AMBULATORY_CARE_PROVIDER_SITE_OTHER): Payer: Medicare Other | Admitting: Family Medicine

## 2019-05-04 ENCOUNTER — Encounter: Payer: Self-pay | Admitting: Family Medicine

## 2019-05-04 ENCOUNTER — Other Ambulatory Visit: Payer: Self-pay

## 2019-05-04 VITALS — BP 130/72 | HR 75 | Temp 99.1°F | Resp 18 | Wt 195.0 lb

## 2019-05-04 DIAGNOSIS — I10 Essential (primary) hypertension: Secondary | ICD-10-CM | POA: Diagnosis not present

## 2019-05-04 DIAGNOSIS — R634 Abnormal weight loss: Secondary | ICD-10-CM

## 2019-05-04 DIAGNOSIS — N401 Enlarged prostate with lower urinary tract symptoms: Secondary | ICD-10-CM | POA: Diagnosis not present

## 2019-05-04 DIAGNOSIS — I25810 Atherosclerosis of coronary artery bypass graft(s) without angina pectoris: Secondary | ICD-10-CM

## 2019-05-04 DIAGNOSIS — R350 Frequency of micturition: Secondary | ICD-10-CM | POA: Diagnosis not present

## 2019-05-04 DIAGNOSIS — E7800 Pure hypercholesterolemia, unspecified: Secondary | ICD-10-CM

## 2019-05-04 DIAGNOSIS — D649 Anemia, unspecified: Secondary | ICD-10-CM

## 2019-05-04 NOTE — Progress Notes (Signed)
Patient: Micheal Wright, Male    DOB: [birth year], 78 y.o.   MRN: [id number] Visit Date: 05/04/2019  Today's Provider: [name], MD   Chief Complaint  Patient presents with  . Annual Exam   Subjective:     Recheck of chronic problems Micheal Wright is a 78 y.o. male who presents today for health maintenance . He feels well.  He does not exercise.Marland Kitchen He reports he is sleeping well, but wakes through the night.  -----------------------------------------------------------------   Review of Systems  Constitutional: Negative.   HENT: Positive for hearing loss.   Eyes: Negative.   Respiratory: Negative.   Cardiovascular: Negative.   Gastrointestinal: Negative.   Endocrine: Negative.   Allergic/Immunologic: Negative.   Hematological: Negative.   Psychiatric/Behavioral: Negative.   All other systems reviewed and are negative.   Social History      He  reports that he has never smoked. He has never used smokeless tobacco. He reports that he does not drink alcohol or use drugs.       Social History   Socioeconomic History  . Marital status: Married    Spouse name: Not on file  . Number of children: 3  . Years of education: Not on file  . Highest education level: 12th grade  Occupational History  . 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 Financial resource strain: Not hard at all  . Food insecurity:    Worry: Never true    Inability: Never true  . Transportation needs:    Medical: No    Non-medical: No  Tobacco Use  . Smoking status: Never Smoker  . Smokeless tobacco: Never Used  Substance and Sexual Activity  . Alcohol use: No  . Drug use: No  . Sexual activity: Never  Lifestyle  . Physical activity:    Days per week: Not on file    Minutes per session: Not on file  . Stress: Not at all  Relationships  . Social connections:    Talks on phone: Not on file    Gets together: Not on file    Attends religious service: Not on file   Active member of club or organization: Not on file    Attends meetings of clubs or organizations: Not on file    Relationship status: Not on file  Other Topics Concern  . Not on file  Social History Narrative   Married with 3 children    Past Medical History:  Diagnosis Date  . APC (atrial premature contractions) 05/04/2014  . 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 Atherosclerosis of coronary artery 11/08/2008   All risk factors treated.   . Atrial complex, premature 05/04/2014  . CAD (coronary artery disease)    s/p CABG 1998; Last cath 2002 in setting of marked ST elevation during stress testing. patent grafts; Echo 8/09 EF 55%. No significant valvular disease.  . Cardiac murmur 09/03/2011   Overview:  Overview:  Last Assessment & Plan:  Likely mild aortic valve sclerosis. Will check echo.    . Carotid artery disease ([place])    u/s 9/10: R 40-59% L 0-39%  . Carotid artery obstruction 08/22/2009   Overview:  Overview:  Qualifier: Diagnosis of  By: Haroldine Laws, MD, Eileen Stanford  Last Assessment & Plan:  Asymptomatic. Due for repeat u/s.  Overview:  Overview:  Qualifier: Diagnosis of  By: Haroldine Laws, MD, Eileen Stanford  Last Assessment & Plan:  Asymptomatic. Due  for repeat u/s.    . Carotid stenosis 08/22/2009   Qualifier: Diagnosis of  By: Haroldine Laws, MD, Eileen Stanford Cerebrovascular disease 11/17/2008  . Chest tightness 01/20/2015  . CHF (congestive heart failure) ([place])   . Chronic atrial fibrillation 03/02/2018  . Coronary artery disease 05/11/2007   Qualifier: Diagnosis of  By: Lurlean Nanny LPN, Regina    . Erectile dysfunction   . Essential hypertension 05/11/2007   Overview:  Overview:  Overview:  Qualifier: Diagnosis of  By: Lurlean Nanny LPN, Regina   Last Assessment & Plan:  BP up slightly here but when he checks [employer] usually 120/65. Will continue current regimen.  Qualifier: Diagnosis of  By: Lurlean Nanny LPN, Regina     . GERD (gastroesophageal reflux disease)   . History of colonic polyps   .  Hypercholesteremia 11/08/2008  . Hyperlipidemia   . Hypertension   . Murmur, cardiac 09/03/2011  . PAC (premature atrial contraction) 05/04/2014  . 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 Sleep apnea      Patient Active Problem List   Diagnosis Date Noted  . Chronic atrial fibrillation 03/02/2018  . 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 Basal cell carcinoma of skin 10/16/2015  . Benign prostatic hyperplasia with urinary obstruction 10/16/2015  . Neuropathy 10/16/2015  . Posthitis 01/10/2015  . PAC (premature atrial contraction) 05/04/2014  . Atrial complex, premature 05/04/2014  . APC (atrial premature contractions) 05/04/2014  . Blood in semen 08/31/2012  . Murmur, cardiac 09/03/2011  . Cardiac murmur 09/03/2011  . 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 DISTURBANCE OF SKIN SENSATION 12/21/2009  . Carotid stenosis 08/22/2009  . Carotid artery obstruction 08/22/2009  . Low back pain 03/24/2009  . Cerebrovascular disease 11/17/2008  . Atherosclerosis of coronary artery 11/08/2008  . Hypercholesteremia 11/08/2008  . COLD SORE 11/12/2007  . Herpes 11/12/2007  . Hyperlipidemia 05/11/2007  . ERECTILE DYSFUNCTION 05/11/2007  . Essential hypertension 05/11/2007  . Coronary artery disease 05/11/2007  . GERD 05/11/2007  . Sleep apnea 05/11/2007  . COLONIC POLYPS, HX OF 05/11/2007  . Gastro-esophageal reflux disease without esophagitis 05/11/2007  . History of colon polyps 05/11/2007    Past Surgical History:  Procedure Laterality Date  . ANGIOPLASTY     x 2  . APPENDECTOMY  1960's  . CARDIAC CATHETERIZATION  2002  . CARDIOVERSION N/A 11/14/2015   Procedure: CARDIOVERSION;  Surgeon: Jolaine Artist, MD;  Location[facility] ENDOSCOPY;  Service: Cardiovascular;  Laterality: N/A;  . COLONOSCOPY W/ POLYPECTOMY    . COLONOSCOPY WITH PROPOFOL N/A 11/13/2015   Procedure:  COLONOSCOPY WITH PROPOFOL;  Surgeon: Manya Silvas, MD;  Location: [facility] ENDOSCOPY;  Service: Endoscopy;  Laterality: N/A;  . COLONOSCOPY WITH PROPOFOL N/A 02/01/2019   Procedure: COLONOSCOPY WITH PROPOFOL;  Surgeon: Manya Silvas, MD;  Location: [facility] ENDOSCOPY;  Service: Endoscopy;  Laterality: N/A;  . CORONARY ARTERY BYPASS GRAFT  1998  . ESOPHAGOGASTRODUODENOSCOPY  04/2004   Inflam. at Z line (biopsy neg)  . ESOPHAGOGASTRODUODENOSCOPY N/A 02/01/2019   Procedure: ESOPHAGOGASTRODUODENOSCOPY (EGD);  Surgeon: Manya Silvas, MD;  Location: [facility] ENDOSCOPY;  Service: Endoscopy;  Laterality: N/A;  . HERPES SIMPLEX VIRUS DFA N/A   . KNEE ARTHROSCOPY W/ PARTIAL MEDIAL MENISCECTOMY  1990s   Left knee  . MRI    . sleep study    . UVULOPALATOPHARYNGOPLASTY      Family History  Family Status  Relation Name Status  . Mother  Deceased at age 25  . Father  Deceased at age 28  . Sister  Deceased       Heart surgery in 32s  . Brother  Deceased at age 39  . Brother  Alive  . Neg Hx  (Not Specified)        His family history includes Alcohol abuse in his father; Breast cancer in his sister; Heart attack in his father; Heart disease in his brother and sister; Rheumatologic disease in his brother; Stroke in his mother. There is no history of Hypertension, Diabetes, Prostate cancer, or Colon cancer.      No Known Allergies   Current Outpatient Medications:  .  amLODipine (NORVASC) 5 MG tablet, TAKE 1 TABLET BY MOUTH ONCE DAILY, Disp: 30 tablet, Rfl: 5 .  atorvastatin (LIPITOR) 80 MG tablet, Take 1 tablet (80 mg total) by mouth daily., Disp: 30 tablet, Rfl: 5 .  ELIQUIS 5 MG TABS tablet, TAKE 1 TABLET BY MOUTH TWICE (2) DAILY, Disp: 180 tablet, Rfl: 1 .  hydrochlorothiazide (MICROZIDE) 12.5 MG capsule, TAKE 1 CAPSULE BY MOUTH ONCE DAILY, Disp: 90 capsule, Rfl: 3 .  losartan (COZAAR) 50 MG tablet, TAKE 1 TABLET BY MOUTH ONCE DAILY, Disp: 90 tablet, Rfl: 1 .  NITROSTAT 0.4 MG SL tablet,  DISSOLVE 1 TABLET UNDER THE TONGUE FOR CHEST PAIN. MAY REPEAT EVERY 5MINUTES UP TO 3 DOSES. IF NO RELIEF, CALL 911**, Disp: 25 tablet, Rfl: 3 .  omeprazole (PRILOSEC) 40 MG capsule, TAKE 1 CAPSULE BY MOUTH ONCE DAILY, Disp: 30 capsule, Rfl: 12 .  Sildenafil Citrate (VIAGRA PO), Take by mouth., Disp: , Rfl:  .  triamcinolone ointment (KENALOG) 0.1 %, Apply 1 application topically 2 (two) times daily., Disp: , Rfl:    Patient Care Team: Jerrol Banana., MD as PCP - General (Unknown Physician Specialty) Bensimhon, Shaune Pascal, MD as Consulting Physician (Cardiology) Carloyn Manner, MD as Referring Physician (Otolaryngology) Pa, [place] as Consulting Physician (Optometry) Manya Silvas, MD as Consulting Physician (Gastroenterology)    Objective:    Vitals: BP 130/72 (BP Location: Right Arm, Patient Position: Sitting, Cuff Size: Normal)   Pulse 75   Temp 99.1 F (37.3 C) (Oral)   Resp 18   Wt 195 lb (88.5 kg)   SpO2 97%   BMI 27.98 kg/m    Vitals:   05/04/19 1402  BP: 130/72  Pulse: 75  Resp: 18  Temp: 99.1 F (37.3 C)  TempSrc: Oral  SpO2: 97%  Weight: 195 lb (88.5 kg)     Physical Exam Vitals signs reviewed.  Constitutional:      Appearance: Normal appearance.  HENT:     Head: Normocephalic and atraumatic.     Right Ear: External ear normal.     Left Ear: External ear normal.     Nose: Nose normal.     Mouth/Throat:     Pharynx: Oropharynx is clear.  Eyes:     General: No scleral icterus.    Conjunctiva/sclera: Conjunctivae normal.  Cardiovascular:     Rate and Rhythm: Normal rate and regular rhythm.     Pulses: Normal pulses.     Heart sounds: Normal heart sounds.  Pulmonary:     Effort: Pulmonary effort is normal.     Breath sounds: Normal breath sounds.  Abdominal:     Palpations: Abdomen is soft.  Musculoskeletal:     Right lower leg: No edema.  Left lower leg: No edema.  Skin:    General: Skin is warm and dry.  Neurological:      General: No focal deficit present.     Mental Status: He is alert and oriented to person, place, and time.  Psychiatric:        Mood and Affect: Mood normal.        Behavior: Behavior normal.        Thought Content: Thought content normal.        Judgment: Judgment normal.      Depression Screen PHQ 2/9 Scores 05/04/2019 05/04/2019 04/29/2018 04/23/2017  PHQ - 2 Score 0 0 0 0  PHQ- 9 Score 0 - - -       Assessment & Plan:     Routine Health Maintenance  1. Essential hypertension Controlled with amlodipine - Comprehensive metabolic panel  2. Anemia, unspecified type  - CBC with Differential/Platelet - TSH  3. Benign prostatic hyperplasia with urinary frequency  - PSA  4. Hypercholesteremia On atorvastatin - Lipid panel  5. Coronary artery disease involving autologous artery coronary bypass graft without angina pectoris All risk factors treated  6. Weight loss Unintentional 13 pound weight loss per patient.  Discussed  with patient and he is not concerned at all.  He feels well.  Will recheck in 3 months unless he feels worse.  At that time repeat work.  To follow-up on weight.  Need for a work-up if weight loss continues  Exercise Activities and Dietary recommendations Goals    . DIET - REDUCE SALT INTAKE TO 2 GRAMS PER DAY OR LESS     Recommend decreasing amount of salt intake to 2 grams or less a day.        Immunization History  Administered Date(s) Administered  . Influenza Split 08/23/2008, 09/14/2009, 09/24/2010, 08/19/2011, 08/17/2012  . Influenza Whole 10/02/2005  . Influenza, High Dose Seasonal PF 09/27/2014, 09/13/2015, 08/23/2016, 08/28/2017, 08/10/2018  . Influenza,inj,Quad PF,6+ Mos 08/07/2013  . Pneumococcal Conjugate-13 04/17/2015  . Pneumococcal Polysaccharide-23 08/23/2008  . Td 10/02/2002  . Tdap 04/17/2015  . Zoster Recombinat (Shingrix) 09/28/2018, 12/15/2018    Health Maintenance  Topic Date Due  . INFLUENZA VACCINE  07/03/2019   . TETANUS/TDAP  04/16/2025  . PNA vac Low Risk Adult  Completed     Discussed health benefits of physical activity, and encouraged him to engage in regular exercise appropriate for his age and condition.    --------------------------------------------------------------------    [name], MD  [place]

## 2019-05-05 ENCOUNTER — Encounter: Payer: Medicare Other | Admitting: Family Medicine

## 2019-05-05 ENCOUNTER — Ambulatory Visit: Payer: Medicare Other

## 2019-05-10 DIAGNOSIS — D649 Anemia, unspecified: Secondary | ICD-10-CM | POA: Diagnosis not present

## 2019-05-10 DIAGNOSIS — R350 Frequency of micturition: Secondary | ICD-10-CM | POA: Diagnosis not present

## 2019-05-10 DIAGNOSIS — E7800 Pure hypercholesterolemia, unspecified: Secondary | ICD-10-CM | POA: Diagnosis not present

## 2019-05-10 DIAGNOSIS — I10 Essential (primary) hypertension: Secondary | ICD-10-CM | POA: Diagnosis not present

## 2019-05-10 DIAGNOSIS — N401 Enlarged prostate with lower urinary tract symptoms: Secondary | ICD-10-CM | POA: Diagnosis not present

## 2019-05-11 LAB — CBC WITH DIFFERENTIAL/PLATELET
Basophils Absolute: 0 10*3/uL (ref 0.0–0.2)
Basos: 0 %
EOS (ABSOLUTE): 0.1 10*3/uL (ref 0.0–0.4)
Eos: 2 %
Hematocrit: 37.8 % (ref 37.5–51.0)
Hemoglobin: 13.1 g/dL (ref 13.0–17.7)
Immature Grans (Abs): 0 10*3/uL (ref 0.0–0.1)
Immature Granulocytes: 0 %
Lymphocytes Absolute: 1.9 10*3/uL (ref 0.7–3.1)
Lymphs: 38 %
MCH: 32.3 pg (ref 26.6–33.0)
MCHC: 34.7 g/dL (ref 31.5–35.7)
MCV: 93 fL (ref 79–97)
Monocytes Absolute: 0.5 10*3/uL (ref 0.1–0.9)
Monocytes: 11 %
Neutrophils Absolute: 2.5 10*3/uL (ref 1.4–7.0)
Neutrophils: 49 %
Platelets: 246 10*3/uL (ref 150–450)
RBC: 4.06 x10E6/uL — ABNORMAL LOW (ref 4.14–5.80)
RDW: 12.8 % (ref 11.6–15.4)
WBC: 5 10*3/uL (ref 3.4–10.8)

## 2019-05-11 LAB — COMPREHENSIVE METABOLIC PANEL
ALT: 16 IU/L (ref 0–44)
AST: 20 IU/L (ref 0–40)
Albumin/Globulin Ratio: 1.6 (ref 1.2–2.2)
Albumin: 4.2 g/dL (ref 3.7–4.7)
Alkaline Phosphatase: 122 IU/L — ABNORMAL HIGH (ref 39–117)
BUN/Creatinine Ratio: 11 (ref 10–24)
BUN: 12 mg/dL (ref 8–27)
Bilirubin Total: 1.4 mg/dL — ABNORMAL HIGH (ref 0.0–1.2)
CO2: 23 mmol/L (ref 20–29)
Calcium: 9.2 mg/dL (ref 8.6–10.2)
Chloride: 102 mmol/L (ref 96–106)
Creatinine, Ser: 1.14 mg/dL (ref 0.76–1.27)
GFR calc Af Amer: 71 mL/min/{1.73_m2} (ref >59)
GFR calc non Af Amer: 62 mL/min/{1.73_m2} (ref >59)
Globulin, Total: 2.6 g/dL (ref 1.5–4.5)
Glucose: 92 mg/dL (ref 65–99)
Potassium: 4.5 mmol/L (ref 3.5–5.2)
Sodium: 141 mmol/L (ref 134–144)
Total Protein: 6.8 g/dL (ref 6.0–8.5)

## 2019-05-11 LAB — LIPID PANEL
Chol/HDL Ratio: 2.7 ratio (ref 0.0–5.0)
Cholesterol, Total: 86 mg/dL — ABNORMAL LOW (ref 100–199)
HDL: 32 mg/dL — ABNORMAL LOW (ref >39)
LDL Calculated: 39 mg/dL (ref 0–99)
Triglycerides: 77 mg/dL (ref 0–149)
VLDL Cholesterol Cal: 15 mg/dL (ref 5–40)

## 2019-05-11 LAB — TSH: TSH: 1.03 u[IU]/mL (ref 0.450–4.500)

## 2019-05-11 LAB — PSA: Prostate Specific Ag, Serum: 2.1 ng/mL (ref 0.0–4.0)

## 2019-05-13 ENCOUNTER — Telehealth: Payer: Self-pay

## 2019-05-13 NOTE — Telephone Encounter (Signed)
-----   Message from Jerrol Banana., MD sent at 05/13/2019  8:09 AM EDT ----- Labs okay.

## 2019-05-13 NOTE — Telephone Encounter (Signed)
Patient was advised.  

## 2019-05-18 ENCOUNTER — Telehealth: Payer: Self-pay | Admitting: Licensed Clinical Social Worker

## 2019-05-18 NOTE — Telephone Encounter (Signed)
Called patient regarding upcoming Webex appointment, patient would prefer this to be a walk-in visit. °

## 2019-05-20 ENCOUNTER — Inpatient Hospital Stay: Payer: Medicare Other

## 2019-05-20 ENCOUNTER — Other Ambulatory Visit: Payer: Self-pay

## 2019-05-20 ENCOUNTER — Inpatient Hospital Stay: Payer: Medicare Other | Attending: Genetic Counselor | Admitting: Licensed Clinical Social Worker

## 2019-05-20 ENCOUNTER — Encounter: Payer: Self-pay | Admitting: Licensed Clinical Social Worker

## 2019-05-20 DIAGNOSIS — Z803 Family history of malignant neoplasm of breast: Secondary | ICD-10-CM | POA: Diagnosis not present

## 2019-05-20 DIAGNOSIS — Z800 Family history of malignant neoplasm of digestive organs: Secondary | ICD-10-CM

## 2019-05-20 DIAGNOSIS — Z860100 Personal history of colon polyps, unspecified: Secondary | ICD-10-CM

## 2019-05-20 DIAGNOSIS — Z8371 Family history of colonic polyps: Secondary | ICD-10-CM | POA: Diagnosis not present

## 2019-05-20 DIAGNOSIS — Z83719 Family history of colon polyps, unspecified: Secondary | ICD-10-CM

## 2019-05-20 NOTE — Progress Notes (Signed)
REFERRING PROVIDER: Marval Regal, NP [facility] [place],  [place] [address]  PRIMARY PROVIDER:  Jerrol Banana., MD  PRIMARY REASON FOR VISIT:  1. COLONIC POLYPS, HX OF   2. Family history of breast cancer   3. Family history of colon cancer   4. Family history of colonic polyps     HISTORY OF PRESENT ILLNESS:   Mr. Micheal Wright, a 78 y.o. male, was seen for a [facility] cancer genetics consultation at the request of Dr. Jerelene Redden due to a personal and family history of colon polyps.  Mr. Stecklein presents to clinic today to discuss the possibility of a hereditary predisposition to cancer, genetic testing, and to further clarify his future cancer risks, as well as potential cancer risks for family members.   IMr. Halley is a 78 y.o. male with no personal history of cancer.    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  He reports having several other polyps when he lived in [place]. As we cannot access those files, we cannot confirm how many total colon polyps Mr. Alcock has had. He estimates 10 or fewer cumulative colon polyps in his lifetime.   Mr. Chuong also reports recent prostate cancer screening that was normal.   CANCER HISTORY:  Oncology History   No history exists.     Past Medical History:  Diagnosis Date  . APC (atrial premature contractions) 05/04/2014  . 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 Atherosclerosis of coronary artery 11/08/2008   All risk factors treated.   . Atrial complex, premature 05/04/2014  . CAD (coronary artery disease)    s/p CABG 1998; Last cath 2002 in setting of marked ST elevation during stress testing. patent grafts; Echo 8/09 EF 55%. No significant valvular disease.  . Cardiac murmur 09/03/2011   Overview:  Overview:  Last Assessment & Plan:  Likely mild aortic  valve sclerosis. Will check echo.    . Carotid artery disease ([place])    u/s 9/10: R 40-59% L 0-39%  . Carotid artery obstruction 08/22/2009   Overview:  Overview:  Qualifier: Diagnosis of  By: Haroldine Laws, MD, Eileen Stanford  Last Assessment & Plan:  Asymptomatic. Due for repeat u/s.  Overview:  Overview:  Qualifier: Diagnosis of  By: Haroldine Laws, MD, Eileen Stanford  Last Assessment & Plan:  Asymptomatic. Due for repeat u/s.    . Carotid stenosis 08/22/2009   Qualifier: Diagnosis of  By: Haroldine Laws, MD, Eileen Stanford Cerebrovascular disease 11/17/2008  . Chest tightness 01/20/2015  . CHF (congestive heart failure) ([place])   . Chronic atrial fibrillation 03/02/2018  . Coronary artery disease 05/11/2007   Qualifier: Diagnosis of  By: Lurlean Nanny LPN, Regina    . Erectile dysfunction   . Essential hypertension 05/11/2007   Overview:  Overview:  Overview:  Qualifier: Diagnosis of  By: Lurlean Nanny LPN, Regina   Last Assessment & Plan:  BP up slightly here but when he checks [employer] usually 120/65. Will continue current regimen.  Qualifier: Diagnosis of  By: Lurlean Nanny LPN, Regina     . Family history of breast cancer   . Family history of colon cancer   . Family history of colonic polyps   . GERD (gastroesophageal reflux disease)   . History of colonic polyps   . Hypercholesteremia  11/08/2008  . Hyperlipidemia   . Hypertension   . Murmur, cardiac 09/03/2011  . PAC (premature atrial contraction) 05/04/2014  . 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 Sleep apnea     Past Surgical History:  Procedure Laterality Date  . ANGIOPLASTY     x 2  . APPENDECTOMY  1960's  . CARDIAC CATHETERIZATION  2002  . CARDIOVERSION N/A 11/14/2015   Procedure: CARDIOVERSION;  Surgeon: Jolaine Artist, MD;  Location: [facility] ENDOSCOPY;  Service: Cardiovascular;  Laterality: N/A;  . COLONOSCOPY W/ POLYPECTOMY    . COLONOSCOPY WITH PROPOFOL N/A 11/13/2015   Procedure: COLONOSCOPY WITH PROPOFOL;  Surgeon: Manya Silvas, MD;   Location: [facility] ENDOSCOPY;  Service: Endoscopy;  Laterality: N/A;  . COLONOSCOPY WITH PROPOFOL N/A 02/01/2019   Procedure: COLONOSCOPY WITH PROPOFOL;  Surgeon: Manya Silvas, MD;  Location: [facility] ENDOSCOPY;  Service: Endoscopy;  Laterality: N/A;  . CORONARY ARTERY BYPASS GRAFT  1998  . ESOPHAGOGASTRODUODENOSCOPY  04/2004   Inflam. at Z line (biopsy neg)  . ESOPHAGOGASTRODUODENOSCOPY N/A 02/01/2019   Procedure: ESOPHAGOGASTRODUODENOSCOPY (EGD);  Surgeon: Manya Silvas, MD;  Location: [facility] ENDOSCOPY;  Service: Endoscopy;  Laterality: N/A;  . HERPES SIMPLEX VIRUS DFA N/A   . KNEE ARTHROSCOPY W/ PARTIAL MEDIAL MENISCECTOMY  1990s   Left knee  . MRI    . sleep study    . UVULOPALATOPHARYNGOPLASTY      Social History   Socioeconomic History  . Marital status: Married    Spouse name: Not on file  . Number of children: 3  . Years of education: Not on file  . Highest education level: 12th grade  Occupational History  . 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 Financial resource strain: Not hard at all  . Food insecurity    Worry: Never true    Inability: Never true  . Transportation needs    Medical: No    Non-medical: No  Tobacco Use  . Smoking status: Never Smoker  . Smokeless tobacco: Never Used  Substance and Sexual Activity  . Alcohol use: No  . Drug use: No  . Sexual activity: Never  Lifestyle  . Physical activity    Days per week: Not on file    Minutes per session: Not on file  . Stress: Not at all  Relationships  . Social Herbalist on phone: Not on file    Gets together: Not on file    Attends religious service: Not on file    Active member of club or organization: Not on file    Attends meetings of clubs or organizations: Not on file    Relationship status: Not on file  Other Topics Concern  . Not on file  Social History Narrative   Married with 3 children     FAMILY HISTORY:  We obtained a detailed, 4-generation family  history.  Significant diagnoses are listed below: Family History  Problem Relation Age of Onset  . Stroke Mother   . Heart attack Father   . Alcohol abuse Father   . Breast cancer Sister        51  . Heart disease Sister   . Rheumatologic disease Brother        Heart  . Heart disease Brother   . Breast cancer Maternal Aunt        dx 30s d. 60s  . Colon cancer Paternal Uncle        dx 25s  .  Colon polyps Son        5-6  . Hypertension Neg Hx   . Diabetes Neg Hx   . Prostate cancer Neg Hx    Mr. Strohm has 3 sons, ages 84, 8, and 53. His 6 year old son, Shanon Brow, has had between 5 and 6 colon polyps. He does not believe his other sons have had colonoscopies. Mr. Alberta had 2 brothers, 1 sister. His sister had breast cancer at 31 and died at 92. His brothers did not have colon polyps that he is aware of or cancer.  Mr. Pulsifer mother died at 78 of a stroke. She did not have cancer or colon polyps that he is aware of. Patient had 2 maternal uncles, 4 maternal aunts. One of his aunts had breast cancer in her 51s and died in her 78s. No cancers in his maternal cousins. He is unaware of ages of death for his maternal grandparents, no cancers he is aware of.  Mr. Appelt father died at 5 of a heart attack. The patient had 5 paternal uncles, 2 paternal aunts. One of his uncles had colon cancer in his 74s. No known cancers in cousins. No known cancers in paternal grandparents, both died in their 53s.   Mr. Weick is unaware of previous family history of genetic testing for hereditary cancer risks. Patient's ancestors are of English/Scottish/[place]ian descent. There is no reported Ashkenazi Jewish ancestry. There is no known consanguinity.  GENETIC COUNSELING ASSESSMENT: Mr. Deterding is a 78 y.o. male with a personal and family history which is somewhat suggestive of a hereditary cancer syndrome/polyposis syndrome and predisposition to cancer. We, therefore, discussed and recommended the following at today's  visit.   DISCUSSION: We discussed that polyps in general are common, however, most people have fewer than 5 lifetime polyps.  When an individual has 10 or more polyps we become concerned about an underlying polyposis syndrome.  The most common hereditary polyposis syndromes are caused by problems in the APC and MUTYH genes, however, more recently, mutations in the [place] and MSH3 genes have been identified in some polyposis families.    We also discussed that 5 - 10% of breast cancer is hereditary, with most cases associated with the BRCA1/BRCA2 genes.  There are other genes that can be associated with hereditary breas cancer syndromes. We reviewed the characteristics, features and inheritance patterns of hereditary cancer syndromes. We also discussed genetic testing, including the appropriate family members to test, the process of testing, insurance coverage and turn-around-time for results. We discussed the implications of a negative, positive and/or variant of uncertain significant result. We recommended Mr. Rubens pursue genetic testing for the [facility] Common Hereditary Cancers gene panel.   The Common Hereditary Cancers Panel offered by [facility] includes sequencing and/or deletion duplication testing of the following 48 genes: APC, ATM, AXIN2, BARD1, BMPR1A, BRCA1, BRCA2, BRIP1, CDH1, CDKN2A (p14ARF), CDKN2A (p16INK4a), CKD4, CHEK2, CTNNA1, DICER1, EPCAM (Deletion/duplication testing only), GREM1 (promoter region deletion/duplication testing only), KIT, MEN1, MLH1, MSH2, MSH3, MSH6, MUTYH, NBN, NF1, NHTL1, PALB2, PDGFRA, PMS2, POLD1, POLE, PTEN, RAD50, RAD51C, RAD51D, RNF43, SDHB, SDHC, SDHD, SMAD4, SMARCA4. STK11, TP53, TSC1, TSC2, and VHL.  The following genes were evaluated for sequence changes only: SDHA and HOXB13 c.251G>A variant only.  Based on Mr. Montanye personal and family history of polyps/cancer, he meets medical criteria for genetic testing. Despite that he meets criteria, he may still have an  out of pocket cost.   PLAN: After considering the risks, benefits, and limitations, Mr.  Creegan provided informed consent to pursue genetic testing and the blood sample was sent to [facility] for analysis of the Common Hereditary Cancers Panel. Results should be available within approximately 2-3 weeks' time, at which point they will be disclosed by telephone to Mr. [place], as will any additional recommendations warranted by these results. Mr. Dugue will receive a summary of his genetic counseling visit and a copy of his results once available. This information will also be available in Epic.   Based on Mr. Abel family history, we recommended his maternal relatives, especially those closely related to his maternal aunt who had breast cancer in her 35s, have genetic counseling and testing. Mr. Stonehocker will let us know if we can be of any assistance in coordinating genetic counseling and/or testing for this family member.   Lastly, we encouraged Mr. Yett to remain in contact with cancer genetics annually so that we can continuously update the family history and inform him of any changes in cancer genetics and testing that may be of benefit for this family.   Mr. Lofgren questions were answered to his satisfaction today. Our contact information was provided should additional questions or concerns arise. Thank you for the referral and allowing [place] to share in the care of your patient.   Faith Rogue, MS Genetic Counselor [email or web address] Phone: [phone]  The patient was seen for a total of 25 minutes in face-to-face genetic counseling.  Drs. Magrinat, Lindi Adie and/or Burr Medico were available for discussion regarding this case.

## 2019-05-27 ENCOUNTER — Other Ambulatory Visit: Payer: Self-pay

## 2019-05-27 ENCOUNTER — Ambulatory Visit (INDEPENDENT_AMBULATORY_CARE_PROVIDER_SITE_OTHER): Payer: Medicare Other | Admitting: Family Medicine

## 2019-05-27 ENCOUNTER — Encounter: Payer: Medicare Other | Admitting: Family Medicine

## 2019-05-27 ENCOUNTER — Encounter: Payer: Self-pay | Admitting: Family Medicine

## 2019-05-27 VITALS — BP 132/64 | HR 79 | Temp 98.4°F | Resp 16 | Wt 194.6 lb

## 2019-05-27 DIAGNOSIS — I25810 Atherosclerosis of coronary artery bypass graft(s) without angina pectoris: Secondary | ICD-10-CM

## 2019-05-27 DIAGNOSIS — L03031 Cellulitis of right toe: Secondary | ICD-10-CM | POA: Diagnosis not present

## 2019-05-27 MED ORDER — AMOXICILLIN-POT CLAVULANATE 875-125 MG PO TABS: 1.0000 | ORAL_TABLET | Freq: Two times a day (BID) | ORAL | 0 refills | Status: DC

## 2019-05-27 NOTE — Progress Notes (Signed)
Patient: Micheal Wright Male    DOB: 16-Aug-1941   78 y.o.   MRN: [id number] Visit Date: 05/27/2019  Today's Provider: [name], MD   Chief Complaint  Patient presents with  . Tick Removal   Subjective:     Other This is a new (patient states that he works out in [employer] and reports that he recently found a tick between his great toe and second toe on his left foot) problem. The current episode started in the past 7 days. The problem has been unchanged. Associated symptoms include joint swelling and a rash. Pertinent negatives include no abdominal pain, anorexia, arthralgias, change in bowel habit, chest pain, chills, congestion, coughing, diaphoresis, fatigue, fever, headaches, myalgias, nausea, neck pain, numbness, sore throat, swollen glands, urinary symptoms, vertigo, visual change, vomiting or weakness. Nothing aggravates the symptoms. Treatments tried: antibiotic ointment. The treatment provided moderate relief.    No Known Allergies   Current Outpatient Medications:  .  amLODipine (NORVASC) 5 MG tablet, TAKE 1 TABLET BY MOUTH ONCE DAILY, Disp: 30 tablet, Rfl: 5 .  atorvastatin (LIPITOR) 80 MG tablet, Take 1 tablet (80 mg total) by mouth daily., Disp: 30 tablet, Rfl: 5 .  ELIQUIS 5 MG TABS tablet, TAKE 1 TABLET BY MOUTH TWICE (2) DAILY, Disp: 180 tablet, Rfl: 1 .  hydrochlorothiazide (MICROZIDE) 12.5 MG capsule, TAKE 1 CAPSULE BY MOUTH ONCE DAILY, Disp: 90 capsule, Rfl: 3 .  losartan (COZAAR) 50 MG tablet, TAKE 1 TABLET BY MOUTH ONCE DAILY, Disp: 90 tablet, Rfl: 1 .  NITROSTAT 0.4 MG SL tablet, DISSOLVE 1 TABLET UNDER THE TONGUE FOR CHEST PAIN. MAY REPEAT EVERY 5MINUTES UP TO 3 DOSES. IF NO RELIEF, CALL 911**, Disp: 25 tablet, Rfl: 3 .  omeprazole (PRILOSEC) 40 MG capsule, TAKE 1 CAPSULE BY MOUTH ONCE DAILY, Disp: 30 capsule, Rfl: 12 .  Sildenafil Citrate (VIAGRA PO), Take by mouth., Disp: , Rfl:  .  triamcinolone ointment (KENALOG) 0.1 %, Apply 1 application  topically 2 (two) times daily., Disp: , Rfl:   Review of Systems  Constitutional: Negative for chills, diaphoresis, fatigue and fever.  HENT: Negative for congestion and sore throat.   Respiratory: Negative for cough.   Cardiovascular: Negative for chest pain.  Gastrointestinal: Negative for abdominal pain, anorexia, change in bowel habit, nausea and vomiting.  Endocrine: Negative.   Musculoskeletal: Positive for joint swelling. Negative for arthralgias, myalgias and neck pain.  Skin: Positive for rash.  Allergic/Immunologic: Negative.   Neurological: Negative for vertigo, weakness, numbness and headaches.  Psychiatric/Behavioral: Negative.     Social History   Tobacco Use  . Smoking status: Never Smoker  . Smokeless tobacco: Never Used  Substance Use Topics  . Alcohol use: No      Objective:   BP 132/64   Pulse 79   Temp 98.4 F (36.9 C) (Oral)   Resp 16   Wt 194 lb 9.6 oz (88.3 kg)   BMI 27.92 kg/m  Vitals:   05/27/19 1446  BP: 132/64  Pulse: 79  Resp: 16  Temp: 98.4 F (36.9 C)  TempSrc: Oral  Weight: 194 lb 9.6 oz (88.3 kg)     Physical Exam Vitals signs reviewed.  Constitutional:      Appearance: Normal appearance.  HENT:     Head: Normocephalic and atraumatic.     Right Ear: External ear normal.     Left Ear: External ear normal.     Nose: Nose normal.  Mouth/Throat:     Pharynx: Oropharynx is clear.  Eyes:     General: No scleral icterus.    Conjunctiva/sclera: Conjunctivae normal.  Cardiovascular:     Rate and Rhythm: Normal rate and regular rhythm.     Pulses: Normal pulses.     Heart sounds: Normal heart sounds.  Pulmonary:     Effort: Pulmonary effort is normal.     Breath sounds: Normal breath sounds.  Abdominal:     Palpations: Abdomen is soft.  Musculoskeletal:     Right lower leg: No edema.     Left lower leg: No edema.  Skin:    General: Skin is warm and dry.     Findings: Erythema present.     Comments: Skin breakdown  with mild cellulitis with skin breakdown in web of 1st /2nd toes.  Neurological:     General: No focal deficit present.     Mental Status: He is alert and oriented to person, place, and time.  Psychiatric:        Mood and Affect: Mood normal.        Behavior: Behavior normal.        Thought Content: Thought content normal.        Judgment: Judgment normal.      No results found for any visits on 05/27/19.     Assessment & Plan    1. Cellulitis of toe of right foot Treat and RTC 1 week. - amoxicillin-clavulanate (AUGMENTIN) 875-125 MG tablet; Take 1 tablet by mouth 2 (two) times daily.  Dispense: 20 tablet; Refill: 0      Cranford Mon, MD  [facility]

## 2019-06-01 ENCOUNTER — Telehealth: Payer: Self-pay | Admitting: Licensed Clinical Social Worker

## 2019-06-01 ENCOUNTER — Encounter: Payer: Self-pay | Admitting: Licensed Clinical Social Worker

## 2019-06-01 ENCOUNTER — Ambulatory Visit: Payer: Self-pay | Admitting: Licensed Clinical Social Worker

## 2019-06-01 DIAGNOSIS — Z800 Family history of malignant neoplasm of digestive organs: Secondary | ICD-10-CM

## 2019-06-01 DIAGNOSIS — Z86010 Personal history of colonic polyps: Secondary | ICD-10-CM

## 2019-06-01 DIAGNOSIS — Z1379 Encounter for other screening for genetic and chromosomal anomalies: Secondary | ICD-10-CM

## 2019-06-01 DIAGNOSIS — Z8371 Family history of colonic polyps: Secondary | ICD-10-CM

## 2019-06-01 DIAGNOSIS — Z803 Family history of malignant neoplasm of breast: Secondary | ICD-10-CM

## 2019-06-01 NOTE — Telephone Encounter (Signed)
Revealed negative genetic testing.  Revealed that a VUS in HOXB13 was identified. This normal result is reassuring.  It is unlikely that there is an increased risk of cancer due to a mutation in one of these genes. It also means we have not identified a hereditary cause for his history of colon polyps. However, genetic testing is not perfect, and cannot definitively rule out a hereditary cause.  It will be important for him to keep in contact with genetics to learn if any additional testing may be needed in the future.

## 2019-06-01 NOTE — Progress Notes (Signed)
HPI:  Mr. Pehl was previously seen in the [place] clinic due to a personal history of colon polyps, family history of cancer, and concerns regarding a hereditary predisposition to cancer. Please refer to our prior cancer genetics clinic note for more information regarding our discussion, assessment and recommendations, at the time. Mr. Dohse recent genetic test results were disclosed to him, as were recommendations warranted by these results. These results and recommendations are discussed in more detail below.  CANCER HISTORY:  Oncology History   No history exists.    FAMILY HISTORY:  We obtained a detailed, 4-generation family history.  Significant diagnoses are listed below: Family History  Problem Relation Age of Onset   Stroke Mother    Heart attack Father    Alcohol abuse Father    Breast cancer Sister        66   Heart disease Sister    Rheumatologic disease Brother        Heart   Heart disease Brother    Breast cancer Maternal Aunt        dx 58s d. 77s   Colon cancer Paternal Uncle        dx 5s   Colon polyps Son        5-6   Hypertension Neg Hx    Diabetes Neg Hx    Prostate cancer Neg Hx     Mr. Moskal has 3 sons, ages 69, 53, and 63. His 62 year old son, Shanon Brow, has had between 5 and 6 colon polyps. He does not believe his other sons have had colonoscopies. Mr. Maneri had 2 brothers, 1 sister. His sister had breast cancer at 62 and died at 97. His brothers did not have colon polyps that he is aware of or cancer.  Mr. Heffern mother died at 51 of a stroke. She did not have cancer or colon polyps that he is aware of. Patient had 2 maternal uncles, 4 maternal aunts. One of his aunts had breast cancer in her 26s and died in her 21s. No cancers in his maternal cousins. He is unaware of ages of death for his maternal grandparents, no cancers he is aware of.  Mr. Lina father died at 44 of a heart attack. The patient had 5 paternal uncles, 2  paternal aunts. One of his uncles had colon cancer in his 52s. No known cancers in cousins. No known cancers in paternal grandparents, both died in their 69s.   Mr. Dunton is unaware of previous family history of genetic testing for hereditary cancer risks. Patient's ancestors are of English/Scottish/[place]ian descent. There is no reported Ashkenazi Jewish ancestry. There is no known consanguinity.  GENETIC TEST RESULTS: Genetic testing reported out on 05/27/2019 through the [facility] Common hereditary cancer panel found no pathogenic mutations.  The Common Hereditary Cancers Panel offered by [facility] includes sequencing and/or deletion duplication testing of the following 48 genes: APC, ATM, AXIN2, BARD1, BMPR1A, BRCA1, BRCA2, BRIP1, CDH1, CDKN2A (p14ARF), CDKN2A (p16INK4a), CKD4, CHEK2, CTNNA1, DICER1, EPCAM (Deletion/duplication testing only), GREM1 (promoter region deletion/duplication testing only), KIT, MEN1, MLH1, MSH2, MSH3, MSH6, MUTYH, NBN, NF1, NHTL1, PALB2, PDGFRA, PMS2, POLD1, POLE, PTEN, RAD50, RAD51C, RAD51D, RNF43, SDHB, SDHC, SDHD, SMAD4, SMARCA4. STK11, TP53, TSC1, TSC2, and VHL.  The following genes were evaluated for sequence changes only: SDHA and HOXB13 c.251G>A variant only.  The test report has been scanned into EPIC and is located under the Molecular Pathology section of the Results Review tab.  A portion of  the result report is included below for reference.     We discussed with Mr. Gorr that because current genetic testing is not perfect, it is possible there may be a gene mutation in one of these genes that current testing cannot detect, but that chance is small.  We also discussed, that there could be another gene that has not yet been discovered, or that we have not yet tested, that is responsible for the cancer diagnoses in the family. It is also possible there is a hereditary cause for the cancer in the family that Mr. Azerbaijan did not inherit and therefore was not identified in  his testing.  Therefore, it is important to remain in touch with cancer genetics in the future so that we can continue to offer Mr. Swigert the most up to date genetic testing.   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  At this time, it is unknown if this variant is associated with increased cancer risk or if this is a normal finding, but most variants such as this get reclassified to being inconsequential. It should not be used to make medical management decisions. With time, we suspect the lab will determine the significance of this variant, if any. If we do learn more about it, we will try to contact Mr. Malone to discuss it further. However, it is important to stay in touch with [place] periodically and keep the address and phone number up to date.  ADDITIONAL GENETIC TESTING: We discussed with Mr. Borg that his genetic testing was fairly extensive.  If there are genes identified to increase cancer risk that can be analyzed in the future, we would be happy to discuss and coordinate this testing at that time.    CANCER SCREENING RECOMMENDATIONS: Mr. Dineen test result is considered negative (normal).  This means that we have not identified a hereditary cause for his personal history of polyps or family history of cancer at this time.  While reassuring, this does not definitively rule out a hereditary predisposition to cancer or polyps. It is still possible that there could be genetic mutations that are undetectable by current technology. There could be genetic mutations in genes that have not been tested or identified to increase cancer risk.  Therefore, it is recommended he continue to follow the cancer management and screening guidelines provided by his  primary healthcare provider.   An individual's cancer risk and medical management are not determined by genetic test results alone. Overall cancer risk assessment incorporates  additional factors, including personal medical history, family history, and any available genetic information that may result in a personalized plan for cancer prevention and surveillance  RECOMMENDATIONS FOR FAMILY MEMBERS:  Relatives in this family might be at some increased risk of developing cancer or colon polyps, over the general population risk, simply due to the family history of cancer.  We recommended male relatives in this family have a yearly mammogram beginning at age 22, or 69 years younger than the earliest onset of cancer, an annual clinical breast exam, and perform monthly breast self-exams. Male relatives in this family should also have a gynecological exam as recommended by their primary provider. All family members should have a colonoscopy by age 39, or as directed by their physicians.   It is also possible there is a hereditary cause for the cancer in Mr. Bulson family that he did not inherit and therefore was not identified in him.  Based on Mr. Confer family history, we recommended his maternal relatives have genetic counseling and testing. Mr. Maldonado will let us know if we can be of any assistance in coordinating genetic counseling and/or testing for these family members.  FOLLOW-UP: Lastly, we discussed with Mr. Schrecengost that cancer genetics is a rapidly advancing field and it is possible that new genetic tests will be appropriate for him and/or his family members in the future. We encouraged him to remain in contact with cancer genetics on an annual basis so we can update his personal and family histories and let him know of advances in cancer genetics that may benefit this family.   Our contact number was provided. Mr. Magri questions were answered to his satisfaction, and he knows he is welcome to call us at anytime with additional questions or concerns.   Faith Rogue, MS Genetic Counselor [email or web address] Phone: [phone]

## 2019-06-02 ENCOUNTER — Ambulatory Visit (INDEPENDENT_AMBULATORY_CARE_PROVIDER_SITE_OTHER): Payer: Medicare Other | Admitting: Family Medicine

## 2019-06-02 ENCOUNTER — Other Ambulatory Visit: Payer: Self-pay

## 2019-06-02 ENCOUNTER — Encounter: Payer: Self-pay | Admitting: Family Medicine

## 2019-06-02 VITALS — BP 118/65 | HR 84 | Temp 97.8°F | Resp 18 | Wt 195.2 lb

## 2019-06-02 DIAGNOSIS — L03031 Cellulitis of right toe: Secondary | ICD-10-CM | POA: Diagnosis not present

## 2019-06-02 DIAGNOSIS — I25810 Atherosclerosis of coronary artery bypass graft(s) without angina pectoris: Secondary | ICD-10-CM | POA: Diagnosis not present

## 2019-06-02 NOTE — Progress Notes (Signed)
Patient: Micheal Wright Male    DOB: 1941/03/04   78 y.o.   MRN: [id number] Visit Date: 06/02/2019  Today's Provider: [name], MD   Chief Complaint  Patient presents with  . Follow-up   Subjective:     HPI Follow up for cellulitis on left foot. He has no pain and the toes are back to normal.  No Known Allergies   Current Outpatient Medications:  .  amLODipine (NORVASC) 5 MG tablet, TAKE 1 TABLET BY MOUTH ONCE DAILY, Disp: 30 tablet, Rfl: 5 .  amoxicillin-clavulanate (AUGMENTIN) 875-125 MG tablet, Take 1 tablet by mouth 2 (two) times daily., Disp: 20 tablet, Rfl: 0 .  atorvastatin (LIPITOR) 80 MG tablet, Take 1 tablet (80 mg total) by mouth daily., Disp: 30 tablet, Rfl: 5 .  ELIQUIS 5 MG TABS tablet, TAKE 1 TABLET BY MOUTH TWICE (2) DAILY, Disp: 180 tablet, Rfl: 1 .  hydrochlorothiazide (MICROZIDE) 12.5 MG capsule, TAKE 1 CAPSULE BY MOUTH ONCE DAILY, Disp: 90 capsule, Rfl: 3 .  losartan (COZAAR) 50 MG tablet, TAKE 1 TABLET BY MOUTH ONCE DAILY, Disp: 90 tablet, Rfl: 1 .  NITROSTAT 0.4 MG SL tablet, DISSOLVE 1 TABLET UNDER THE TONGUE FOR CHEST PAIN. MAY REPEAT EVERY 5MINUTES UP TO 3 DOSES. IF NO RELIEF, CALL 911**, Disp: 25 tablet, Rfl: 3 .  omeprazole (PRILOSEC) 40 MG capsule, TAKE 1 CAPSULE BY MOUTH ONCE DAILY, Disp: 30 capsule, Rfl: 12 .  Sildenafil Citrate (VIAGRA PO), Take by mouth., Disp: , Rfl:  .  triamcinolone ointment (KENALOG) 0.1 %, Apply 1 application topically 2 (two) times daily., Disp: , Rfl:   Review of Systems  Constitutional: Negative.   Respiratory: Negative.   Cardiovascular: Negative.   Musculoskeletal: Negative.   Skin: Negative.   All other systems reviewed and are negative.   Social History   Tobacco Use  . Smoking status: Never Smoker  . Smokeless tobacco: Never Used  Substance Use Topics  . Alcohol use: No      Objective:   BP 118/65 (BP Location: Left Arm, Patient Position: Sitting, Cuff Size: Normal)   Pulse 84   Temp 97.8  F (36.6 C) (Oral)   Resp 18   Wt 195 lb 3.2 oz (88.5 kg)   SpO2 97%   BMI 28.01 kg/m  Vitals:   06/02/19 1056  BP: 118/65  Pulse: 84  Resp: 18  Temp: 97.8 F (36.6 C)  TempSrc: Oral  SpO2: 97%  Weight: 195 lb 3.2 oz (88.5 kg)     Physical Exam Constitutional:      Appearance: Normal appearance.  HENT:     Head: Normocephalic and atraumatic.     Right Ear: External ear normal.     Left Ear: External ear normal.     Nose: Nose normal.  Cardiovascular:     Rate and Rhythm: Regular rhythm.     Heart sounds: Normal heart sounds.  Pulmonary:     Effort: Pulmonary effort is normal.  Skin:    General: Skin is warm and dry.     Comments: Skin breakdown with drainage in webbing of toes has almost completely healed.  Neurological:     Mental Status: He is alert and oriented to person, place, and time.  Psychiatric:        Mood and Affect: Mood normal.        Behavior: Behavior normal.        Thought Content: Thought content normal.  Judgment: Judgment normal.      No results found for any visits on 06/02/19.     Assessment & Plan    1. Cellulitis of toe of right foot Finish antibiotics and use warm soaks daily.    I have done the exam and reviewed the above chart and it is accurate to the best of my knowledge. Development worker, community has been used in this note in any air is in the dictation or transcription are unintentional.  [name], MD  [place]

## 2019-06-23 DIAGNOSIS — Z08 Encounter for follow-up examination after completed treatment for malignant neoplasm: Secondary | ICD-10-CM | POA: Diagnosis not present

## 2019-06-23 DIAGNOSIS — L570 Actinic keratosis: Secondary | ICD-10-CM | POA: Diagnosis not present

## 2019-06-23 DIAGNOSIS — Z85828 Personal history of other malignant neoplasm of skin: Secondary | ICD-10-CM | POA: Diagnosis not present

## 2019-06-23 DIAGNOSIS — X32XXXA Exposure to sunlight, initial encounter: Secondary | ICD-10-CM | POA: Diagnosis not present

## 2019-06-23 DIAGNOSIS — L728 Other follicular cysts of the skin and subcutaneous tissue: Secondary | ICD-10-CM | POA: Diagnosis not present

## 2019-06-25 ENCOUNTER — Other Ambulatory Visit: Payer: Self-pay | Admitting: Internal Medicine

## 2019-07-06 DIAGNOSIS — M545 Low back pain: Secondary | ICD-10-CM | POA: Diagnosis not present

## 2019-07-07 DIAGNOSIS — M545 Low back pain: Secondary | ICD-10-CM | POA: Diagnosis not present

## 2019-07-13 DIAGNOSIS — H903 Sensorineural hearing loss, bilateral: Secondary | ICD-10-CM | POA: Diagnosis not present

## 2019-07-13 DIAGNOSIS — M545 Low back pain: Secondary | ICD-10-CM | POA: Diagnosis not present

## 2019-07-13 DIAGNOSIS — H6121 Impacted cerumen, right ear: Secondary | ICD-10-CM | POA: Diagnosis not present

## 2019-07-14 ENCOUNTER — Encounter: Payer: Medicare Other | Admitting: Family Medicine

## 2019-07-15 DIAGNOSIS — M545 Low back pain: Secondary | ICD-10-CM | POA: Diagnosis not present

## 2019-07-20 DIAGNOSIS — M545 Low back pain: Secondary | ICD-10-CM | POA: Diagnosis not present

## 2019-07-27 DIAGNOSIS — M545 Low back pain: Secondary | ICD-10-CM | POA: Diagnosis not present

## 2019-07-30 ENCOUNTER — Other Ambulatory Visit (HOSPITAL_COMMUNITY): Payer: Self-pay | Admitting: Internal Medicine

## 2019-07-30 DIAGNOSIS — I2510 Atherosclerotic heart disease of native coronary artery without angina pectoris: Secondary | ICD-10-CM

## 2019-08-04 ENCOUNTER — Other Ambulatory Visit: Payer: Self-pay

## 2019-08-04 ENCOUNTER — Encounter: Payer: Self-pay | Admitting: Family Medicine

## 2019-08-04 ENCOUNTER — Ambulatory Visit (INDEPENDENT_AMBULATORY_CARE_PROVIDER_SITE_OTHER): Payer: Medicare Other | Admitting: Family Medicine

## 2019-08-04 VITALS — BP 143/71 | HR 84 | Temp 98.3°F | Resp 18 | Wt 196.2 lb

## 2019-08-04 DIAGNOSIS — I2510 Atherosclerotic heart disease of native coronary artery without angina pectoris: Secondary | ICD-10-CM | POA: Diagnosis not present

## 2019-08-04 DIAGNOSIS — R634 Abnormal weight loss: Secondary | ICD-10-CM | POA: Diagnosis not present

## 2019-08-04 DIAGNOSIS — I10 Essential (primary) hypertension: Secondary | ICD-10-CM

## 2019-08-04 DIAGNOSIS — E7800 Pure hypercholesterolemia, unspecified: Secondary | ICD-10-CM

## 2019-08-04 DIAGNOSIS — I25810 Atherosclerosis of coronary artery bypass graft(s) without angina pectoris: Secondary | ICD-10-CM | POA: Diagnosis not present

## 2019-08-04 DIAGNOSIS — Z23 Encounter for immunization: Secondary | ICD-10-CM | POA: Diagnosis not present

## 2019-08-04 NOTE — Progress Notes (Signed)
infl      Patient: Micheal Wright Male    DOB: 06-04-1941   78 y.o.   MRN: [id number] Visit Date: 08/04/2019  Today's Provider: [name], MD   Chief Complaint  Patient presents with  . Hypertension  . Follow-up   Subjective:     HPI  3 Month follow up for hypertension.  No Known Allergies   Current Outpatient Medications:  .  amLODipine (NORVASC) 5 MG tablet, TAKE 1 TABLET BY MOUTH ONCE DAILY, Disp: 30 tablet, Rfl: 5 .  atorvastatin (LIPITOR) 80 MG tablet, Take 1 tablet (80 mg total) by mouth daily. Needs appt for further refills, Disp: 30 tablet, Rfl: 2 .  ELIQUIS 5 MG TABS tablet, TAKE 1 TABLET BY MOUTH TWICE (2) DAILY, Disp: 180 tablet, Rfl: 1 .  hydrochlorothiazide (MICROZIDE) 12.5 MG capsule, TAKE 1 CAPSULE BY MOUTH ONCE DAILY, Disp: 90 capsule, Rfl: 3 .  losartan (COZAAR) 50 MG tablet, TAKE 1 TABLET BY MOUTH ONCE DAILY, Disp: 90 tablet, Rfl: 1 .  NITROSTAT 0.4 MG SL tablet, DISSOLVE 1 TABLET UNDER THE TONGUE FOR CHEST PAIN. MAY REPEAT EVERY 5MINUTES UP TO 3 DOSES. IF NO RELIEF, CALL 911**, Disp: 25 tablet, Rfl: 3 .  omeprazole (PRILOSEC) 40 MG capsule, TAKE 1 CAPSULE BY MOUTH ONCE DAILY, Disp: 30 capsule, Rfl: 12 .  Sildenafil Citrate (VIAGRA PO), Take by mouth., Disp: , Rfl:  .  triamcinolone ointment (KENALOG) 0.1 %, Apply 1 application topically 2 (two) times daily., Disp: , Rfl:  .  amoxicillin-clavulanate (AUGMENTIN) 875-125 MG tablet, Take 1 tablet by mouth 2 (two) times daily. (Patient not taking: Reported on 08/04/2019), Disp: 20 tablet, Rfl: 0  Review of Systems  Constitutional: Negative.   HENT: Positive for hearing loss.   Eyes: Negative.   Respiratory: Negative.   Cardiovascular: Negative.   Gastrointestinal: Negative.   Endocrine: Negative.   Musculoskeletal: Positive for myalgias.       Myalgias better as day goes on.  Allergic/Immunologic: Negative.   Hematological: Negative.   Psychiatric/Behavioral: Negative.   All other systems reviewed and  are negative.   Social History   Tobacco Use  . Smoking status: Never Smoker  . Smokeless tobacco: Never Used  Substance Use Topics  . Alcohol use: No      Objective:   BP (!) 143/71 (BP Location: Left Arm, Patient Position: Sitting, Cuff Size: Normal)   Pulse 84   Temp 98.3 F (36.8 C) (Oral)   Resp 18   Wt 196 lb 3.2 oz (89 kg)   SpO2 97%   BMI 28.15 kg/m  Vitals:   08/04/19 0816  BP: (!) 143/71  Pulse: 84  Resp: 18  Temp: 98.3 F (36.8 C)  TempSrc: Oral  SpO2: 97%  Weight: 196 lb 3.2 oz (89 kg)  Body mass index is 28.15 kg/m.   Physical Exam Vitals signs reviewed.  Constitutional:      Appearance: Normal appearance.  HENT:     Head: Normocephalic and atraumatic.     Right Ear: External ear normal.     Left Ear: External ear normal.     Nose: Nose normal.     Mouth/Throat:     Pharynx: Oropharynx is clear.  Eyes:     General: No scleral icterus.    Conjunctiva/sclera: Conjunctivae normal.  Cardiovascular:     Rate and Rhythm: Normal rate and regular rhythm.     Pulses: Normal pulses.     Heart sounds: Normal heart sounds.  Pulmonary:     Effort: Pulmonary effort is normal.     Breath sounds: Normal breath sounds.  Abdominal:     Palpations: Abdomen is soft.  Musculoskeletal:     Right lower leg: No edema.     Left lower leg: No edema.  Skin:    General: Skin is warm and dry.  Neurological:     General: No focal deficit present.     Mental Status: He is alert and oriented to person, place, and time.  Psychiatric:        Mood and Affect: Mood normal.        Behavior: Behavior normal.        Thought Content: Thought content normal.        Judgment: Judgment normal.      No results found for any visits on 08/04/19.     Assessment & Plan    1. Weight loss Pt has gained weight --1 lb --since last visit. - Comp. Metabolic Panel (12) - CBC w/Diff/Platelet  2. Need for immunization against influenza  - Flu vaccine HIGH DOSE PF (Fluzone  High dose)  3. Atherosclerosis of native coronary artery of native heart without angina pectoris   4. Hypercholesteremia Mild myalgias in am.On lipitor.  5. Essential hypertension  On CCB/HCT 6.Afib    [name], MD  [facility]

## 2019-08-05 LAB — COMP. METABOLIC PANEL (12)
AST: 21 IU/L (ref 0–40)
Albumin/Globulin Ratio: 1.5 (ref 1.2–2.2)
Albumin: 4.1 g/dL (ref 3.7–4.7)
Alkaline Phosphatase: 120 IU/L — ABNORMAL HIGH (ref 39–117)
BUN/Creatinine Ratio: 8 — ABNORMAL LOW (ref 10–24)
BUN: 9 mg/dL (ref 8–27)
Bilirubin Total: 0.9 mg/dL (ref 0.0–1.2)
Calcium: 9.2 mg/dL (ref 8.6–10.2)
Chloride: 105 mmol/L (ref 96–106)
Creatinine, Ser: 1.16 mg/dL (ref 0.76–1.27)
GFR calc Af Amer: 69 mL/min/{1.73_m2} (ref >59)
GFR calc non Af Amer: 60 mL/min/{1.73_m2} (ref >59)
Globulin, Total: 2.7 g/dL (ref 1.5–4.5)
Glucose: 109 mg/dL — ABNORMAL HIGH (ref 65–99)
Potassium: 4 mmol/L (ref 3.5–5.2)
Sodium: 141 mmol/L (ref 134–144)
Total Protein: 6.8 g/dL (ref 6.0–8.5)

## 2019-08-05 LAB — CBC WITH DIFFERENTIAL/PLATELET
Basophils Absolute: 0 10*3/uL (ref 0.0–0.2)
Basos: 0 %
EOS (ABSOLUTE): 0.1 10*3/uL (ref 0.0–0.4)
Eos: 2 %
Hematocrit: 38.2 % (ref 37.5–51.0)
Hemoglobin: 12.9 g/dL — ABNORMAL LOW (ref 13.0–17.7)
Immature Grans (Abs): 0 10*3/uL (ref 0.0–0.1)
Immature Granulocytes: 0 %
Lymphocytes Absolute: 1.7 10*3/uL (ref 0.7–3.1)
Lymphs: 43 %
MCH: 31.9 pg (ref 26.6–33.0)
MCHC: 33.8 g/dL (ref 31.5–35.7)
MCV: 94 fL (ref 79–97)
Monocytes Absolute: 0.6 10*3/uL (ref 0.1–0.9)
Monocytes: 16 %
Neutrophils Absolute: 1.6 10*3/uL (ref 1.4–7.0)
Neutrophils: 39 %
Platelets: 247 10*3/uL (ref 150–450)
RBC: 4.05 x10E6/uL — ABNORMAL LOW (ref 4.14–5.80)
RDW: 12.7 % (ref 11.6–15.4)
WBC: 4 10*3/uL (ref 3.4–10.8)

## 2019-08-10 DIAGNOSIS — M545 Low back pain: Secondary | ICD-10-CM | POA: Diagnosis not present

## 2019-08-11 ENCOUNTER — Telehealth: Payer: Self-pay | Admitting: Family Medicine

## 2019-08-11 ENCOUNTER — Other Ambulatory Visit: Payer: Self-pay

## 2019-08-11 DIAGNOSIS — Z20822 Contact with and (suspected) exposure to covid-19: Secondary | ICD-10-CM

## 2019-08-11 DIAGNOSIS — R6889 Other general symptoms and signs: Secondary | ICD-10-CM | POA: Diagnosis not present

## 2019-08-11 NOTE — Telephone Encounter (Signed)
Pt had a flu shot Wed. of last week. He has been feeling bad and running a fever 101. Off and on.  Wife feels bad as well.  Please call wife back.  Thanks, [employer]

## 2019-08-11 NOTE — Telephone Encounter (Signed)
Patient's wife stated he received a influenza vaccine on Wednesday but started running a fever on Sunday. Temperatures are ranging between 100.4-101.0. He is very fatigued, having chills, and a dry cough. Possible exposure this weekend at a store with shoppers not wearing masks in close contact. Patient advised to go to [facility] for Covid testing and to self quarantine until results are received.

## 2019-08-11 NOTE — Telephone Encounter (Signed)
Agree with covid testing

## 2019-08-12 LAB — NOVEL CORONAVIRUS, NAA: SARS-CoV-2, NAA: NOT DETECTED

## 2019-08-13 ENCOUNTER — Telehealth: Payer: Self-pay

## 2019-08-13 ENCOUNTER — Ambulatory Visit (INDEPENDENT_AMBULATORY_CARE_PROVIDER_SITE_OTHER): Payer: Medicare Other | Admitting: Physician Assistant

## 2019-08-13 ENCOUNTER — Other Ambulatory Visit: Payer: Self-pay

## 2019-08-13 ENCOUNTER — Encounter: Payer: Self-pay | Admitting: Physician Assistant

## 2019-08-13 VITALS — Temp 100.2°F

## 2019-08-13 DIAGNOSIS — I25810 Atherosclerosis of coronary artery bypass graft(s) without angina pectoris: Secondary | ICD-10-CM | POA: Diagnosis not present

## 2019-08-13 DIAGNOSIS — K529 Noninfective gastroenteritis and colitis, unspecified: Secondary | ICD-10-CM | POA: Diagnosis not present

## 2019-08-13 MED ORDER — CIPROFLOXACIN HCL 250 MG PO TABS: 250.0000 mg | ORAL_TABLET | Freq: Two times a day (BID) | ORAL | 0 refills | Status: DC

## 2019-08-13 MED ORDER — ONDANSETRON HCL 4 MG PO TABS: 4.0000 mg | ORAL_TABLET | Freq: Three times a day (TID) | ORAL | 0 refills | Status: DC | PRN

## 2019-08-13 NOTE — Progress Notes (Signed)
Virtual Visit via Telephone Note  I connected with Micheal Wright on 08/13/19 at  2:40 PM EDT by telephone and verified that I am speaking with the correct person using two identifiers.  Location: Patient: Home Provider: [facility]   I discussed the limitations, risks, security and privacy concerns of performing an evaluation and management service by telephone and the availability of in person appointments. I also discussed with the patient that there may be a patient responsible charge related to this service. The patient expressed understanding and agreed to proceed.  Mar Daring, PA-C   Patient: Micheal Wright Male    DOB: 1940-12-20   78 y.o.   MRN: [id number] Visit Date: 08/13/2019  Today's Provider: Mar Daring, PA-C   No chief complaint on file.  Subjective:     HPI   Micheal Wright is seen today via telephone visit for fevers. He has been having them off and on since Monday, 08/09/19. Highest has been 101. Today was 100.2 twice. He reports he got his flu shot on Wednesday, 08/04/19. He had a little runny nose on Thursday and some Friday enough to take a Benadryl for it. Monday the fever started, Tuesday morning and all day he was ok, no fever, then Tuesday night the fever spiked again. He denies any URI symptoms, no cough, no SOB, no change in taste or smell. Still has appetite, but when he eats gets very "queasy". He denies vomiting or diarrhea. Said if he ate more than what he has been he probably would throw up. Wife is worried because his intake has been decreasing and he is getting more weak. Him and his wife were covid tested on 08/11/19 and were both negative.   No Known Allergies   Current Outpatient Medications:  .  amLODipine (NORVASC) 5 MG tablet, TAKE 1 TABLET BY MOUTH ONCE DAILY, Disp: 30 tablet, Rfl: 5 .  amoxicillin-clavulanate (AUGMENTIN) 875-125 MG tablet, Take 1 tablet by mouth 2 (two) times daily. (Patient not taking: Reported on 08/04/2019), Disp: 20 tablet,  Rfl: 0 .  atorvastatin (LIPITOR) 80 MG tablet, Take 1 tablet (80 mg total) by mouth daily. Needs appt for further refills, Disp: 30 tablet, Rfl: 2 .  ELIQUIS 5 MG TABS tablet, TAKE 1 TABLET BY MOUTH TWICE (2) DAILY, Disp: 180 tablet, Rfl: 1 .  hydrochlorothiazide (MICROZIDE) 12.5 MG capsule, TAKE 1 CAPSULE BY MOUTH ONCE DAILY, Disp: 90 capsule, Rfl: 3 .  losartan (COZAAR) 50 MG tablet, TAKE 1 TABLET BY MOUTH ONCE DAILY, Disp: 90 tablet, Rfl: 1 .  NITROSTAT 0.4 MG SL tablet, DISSOLVE 1 TABLET UNDER THE TONGUE FOR CHEST PAIN. MAY REPEAT EVERY 5MINUTES UP TO 3 DOSES. IF NO RELIEF, CALL 911**, Disp: 25 tablet, Rfl: 3 .  omeprazole (PRILOSEC) 40 MG capsule, TAKE 1 CAPSULE BY MOUTH ONCE DAILY, Disp: 30 capsule, Rfl: 12 .  Sildenafil Citrate (VIAGRA PO), Take by mouth., Disp: , Rfl:  .  triamcinolone ointment (KENALOG) 0.1 %, Apply 1 application topically 2 (two) times daily., Disp: , Rfl:   Review of Systems  Constitutional: Positive for fever.  HENT: Negative.   Respiratory: Negative.   Cardiovascular: Negative.   Gastrointestinal: Positive for nausea. Negative for abdominal pain, constipation, diarrhea and vomiting.  Neurological: Positive for weakness.    Social History   Tobacco Use  . Smoking status: Never Smoker  . Smokeless tobacco: Never Used  Substance Use Topics  . Alcohol use: No      Objective:  There were no vitals taken for this visit. There were no vitals filed for this visit.There is no height or weight on file to calculate BMI.   Physical Exam Vitals signs reviewed.  Constitutional:      General: He is not in acute distress. Pulmonary:     Effort: No respiratory distress.  Neurological:     Mental Status: He is alert.      No results found for any visits on 08/13/19.     Assessment & Plan    1. Gastroenteritis Possibly gastroenteritis. Will treat with cipro as below since he is having fevers and may indicate a bacterial source. Zofran sent in for  nausea. Try to increase fluid intake. Eat a bland diet. Increase as tolerated. Will have them f/u with Dr. Rosanna Randy early next week to make sure improving. Call if symptoms worsen or proceed to the ER.  - ciprofloxacin (CIPRO) 250 MG tablet; Take 1 tablet (250 mg total) by mouth 2 (two) times daily.  Dispense: 14 tablet; Refill: 0 - ondansetron (ZOFRAN) 4 MG tablet; Take 1 tablet (4 mg total) by mouth every 8 (eight) hours as needed.  Dispense: 20 tablet; Refill: 0    I discussed the assessment and treatment plan with the patient. The patient was provided an opportunity to ask questions and all were answered. The patient agreed with the plan and demonstrated an understanding of the instructions.   The patient was advised to call back or seek an in-person evaluation if the symptoms worsen or if the condition fails to improve as anticipated.  I provided 14 minutes of non-face-to-face time during this encounter.  Mar Daring, PA-C  [facility]

## 2019-08-13 NOTE — Telephone Encounter (Signed)
Patient has been running fever for 4-5 days.  Neg Covid on 08/11/19.  No other symptoms other than no appetite.  He is a pt of Dr Darnell Level.  There are no openings with anyone right now but I noticed you had some blocked app'ts and I didn't know if you would be able to do a phone visit with him.  If so it will actually be his wife that you speak to bc he has his hearing aids being repaired.  CB # is [phone]

## 2019-08-13 NOTE — Telephone Encounter (Signed)
I have no more availabilities today. He may need to go to Urgent Care

## 2019-08-17 ENCOUNTER — Telehealth: Payer: Self-pay | Admitting: Family Medicine

## 2019-08-17 ENCOUNTER — Ambulatory Visit (INDEPENDENT_AMBULATORY_CARE_PROVIDER_SITE_OTHER): Payer: Medicare Other | Admitting: Family Medicine

## 2019-08-17 ENCOUNTER — Other Ambulatory Visit: Payer: Self-pay

## 2019-08-17 DIAGNOSIS — K529 Noninfective gastroenteritis and colitis, unspecified: Secondary | ICD-10-CM | POA: Diagnosis not present

## 2019-08-17 DIAGNOSIS — R634 Abnormal weight loss: Secondary | ICD-10-CM | POA: Diagnosis not present

## 2019-08-17 DIAGNOSIS — I25810 Atherosclerosis of coronary artery bypass graft(s) without angina pectoris: Secondary | ICD-10-CM | POA: Diagnosis not present

## 2019-08-17 NOTE — Telephone Encounter (Signed)
Please review

## 2019-08-17 NOTE — Telephone Encounter (Signed)
Pt's wife calling back regarding Dr. Rosanna Randy was supposed to call pt back at 3:40pm. She was calling to see if he was still going to call Owyn - pt back.  Please call on wife phone [phone].  Thanks, [employer]

## 2019-08-17 NOTE — Progress Notes (Signed)
Patient: Micheal Wright Male    DOB: [birth year]   78 y.o.   MRN: [id number] Visit Date: 08/17/2019  Today's Provider: [name], MD   Subjective:    Virtual Visit via Telephone Note  I connected with Micheal Wright on 08/17/19 at  3:40 PM EDT by telephone and verified that I am speaking with the correct person using two identifiers.  I discussed the limitations, risks, security and privacy concerns of performing an evaluation and management service by telephone and the availability of in person appointments. I also discussed with the patient that there may be a patient responsible charge related to this service. The patient expressed understanding and agreed to proceed.   History of Present Illness: Patient was seen via telephone visit on 08/13/2019 with Grace Bushy, PA for fevers and nausea. He also mentioned that he had upper respiratory symptoms. He was treated with cipro 250mg  BID X 7 days, and Zofran as needed.    fever for 3 days, able to drink liquids and finally able to eat as of yesterday. 5-6 lb weight loss Observations/Objective:     No Known Allergies   Current Outpatient Medications:  .  amLODipine (NORVASC) 5 MG tablet, TAKE 1 TABLET BY MOUTH ONCE DAILY, Disp: 30 tablet, Rfl: 5 .  atorvastatin (LIPITOR) 80 MG tablet, Take 1 tablet (80 mg total) by mouth daily. Needs appt for further refills, Disp: 30 tablet, Rfl: 2 .  ciprofloxacin (CIPRO) 250 MG tablet, Take 1 tablet (250 mg total) by mouth 2 (two) times daily., Disp: 14 tablet, Rfl: 0 .  ELIQUIS 5 MG TABS tablet, TAKE 1 TABLET BY MOUTH TWICE (2) DAILY, Disp: 180 tablet, Rfl: 1 .  hydrochlorothiazide (MICROZIDE) 12.5 MG capsule, TAKE 1 CAPSULE BY MOUTH ONCE DAILY, Disp: 90 capsule, Rfl: 3 .  losartan (COZAAR) 50 MG tablet, TAKE 1 TABLET BY MOUTH ONCE DAILY, Disp: 90 tablet, Rfl: 1 .  NITROSTAT 0.4 MG SL tablet, DISSOLVE 1 TABLET UNDER THE TONGUE FOR CHEST PAIN. MAY REPEAT EVERY 5MINUTES UP TO 3 DOSES. IF  NO RELIEF, CALL 911**, Disp: 25 tablet, Rfl: 3 .  omeprazole (PRILOSEC) 40 MG capsule, TAKE 1 CAPSULE BY MOUTH ONCE DAILY, Disp: 30 capsule, Rfl: 12 .  ondansetron (ZOFRAN) 4 MG tablet, Take 1 tablet (4 mg total) by mouth every 8 (eight) hours as needed., Disp: 20 tablet, Rfl: 0 .  Sildenafil Citrate (VIAGRA PO), Take by mouth., Disp: , Rfl:  .  triamcinolone ointment (KENALOG) 0.1 %, Apply 1 application topically 2 (two) times daily., Disp: , Rfl:   Review of Systems  Social History   Tobacco Use  . Smoking status: Never Smoker  . Smokeless tobacco: Never Used  Substance Use Topics  . Alcohol use: No      Objective:   There were no vitals taken for this visit. There were no vitals filed for this visit.There is no height or weight on file to calculate BMI.   Physical Exam Labs also reviewed.  No results found for any visits on 08/17/19.     Assessment & Plan    Follow Up Instructions: 1. Gastroenteritis Resolving. Encouraged fluids and slowly add food back.  Try glycoloax for some constipation.  2. Weight loss F/u if it continues--I think this should resolve .    I discussed the assessment and treatment plan with the patient. The patient was provided an opportunity to ask questions and all were answered. The patient agreed with  the plan and demonstrated an understanding of the instructions.   The patient was advised to call back or seek an in-person evaluation if the symptoms worsen or if the condition fails to improve as anticipated.  I provided 10 minutes of non-face-to-face time during this encounter.       Cranford Mon, MD  [facility]

## 2019-08-24 ENCOUNTER — Other Ambulatory Visit: Payer: Self-pay | Admitting: Family Medicine

## 2019-08-25 DIAGNOSIS — D485 Neoplasm of uncertain behavior of skin: Secondary | ICD-10-CM | POA: Diagnosis not present

## 2019-08-25 DIAGNOSIS — D225 Melanocytic nevi of trunk: Secondary | ICD-10-CM | POA: Diagnosis not present

## 2019-08-25 DIAGNOSIS — Z85828 Personal history of other malignant neoplasm of skin: Secondary | ICD-10-CM | POA: Diagnosis not present

## 2019-08-25 DIAGNOSIS — L570 Actinic keratosis: Secondary | ICD-10-CM | POA: Diagnosis not present

## 2019-08-25 DIAGNOSIS — C44529 Squamous cell carcinoma of skin of other part of trunk: Secondary | ICD-10-CM | POA: Diagnosis not present

## 2019-08-30 DIAGNOSIS — K219 Gastro-esophageal reflux disease without esophagitis: Secondary | ICD-10-CM | POA: Diagnosis not present

## 2019-08-30 DIAGNOSIS — K5900 Constipation, unspecified: Secondary | ICD-10-CM | POA: Diagnosis not present

## 2019-08-30 DIAGNOSIS — K2950 Unspecified chronic gastritis without bleeding: Secondary | ICD-10-CM | POA: Diagnosis not present

## 2019-08-30 DIAGNOSIS — R634 Abnormal weight loss: Secondary | ICD-10-CM | POA: Diagnosis not present

## 2019-08-31 DIAGNOSIS — L905 Scar conditions and fibrosis of skin: Secondary | ICD-10-CM | POA: Diagnosis not present

## 2019-08-31 DIAGNOSIS — C44529 Squamous cell carcinoma of skin of other part of trunk: Secondary | ICD-10-CM | POA: Diagnosis not present

## 2019-09-05 DIAGNOSIS — K5900 Constipation, unspecified: Secondary | ICD-10-CM | POA: Insufficient documentation

## 2019-09-05 DIAGNOSIS — K2950 Unspecified chronic gastritis without bleeding: Secondary | ICD-10-CM | POA: Insufficient documentation

## 2019-09-15 ENCOUNTER — Other Ambulatory Visit: Payer: Self-pay

## 2019-09-15 ENCOUNTER — Ambulatory Visit (INDEPENDENT_AMBULATORY_CARE_PROVIDER_SITE_OTHER): Payer: Medicare Other | Admitting: Family Medicine

## 2019-09-15 ENCOUNTER — Encounter: Payer: Self-pay | Admitting: Family Medicine

## 2019-09-15 VITALS — BP 118/72 | HR 90 | Temp 98.4°F | Resp 16 | Wt 192.0 lb

## 2019-09-15 DIAGNOSIS — Z114 Encounter for screening for human immunodeficiency virus [HIV]: Secondary | ICD-10-CM | POA: Diagnosis not present

## 2019-09-15 DIAGNOSIS — R5383 Other fatigue: Secondary | ICD-10-CM

## 2019-09-15 DIAGNOSIS — R634 Abnormal weight loss: Secondary | ICD-10-CM

## 2019-09-15 DIAGNOSIS — M791 Myalgia, unspecified site: Secondary | ICD-10-CM | POA: Diagnosis not present

## 2019-09-15 DIAGNOSIS — I25810 Atherosclerosis of coronary artery bypass graft(s) without angina pectoris: Secondary | ICD-10-CM | POA: Diagnosis not present

## 2019-09-15 DIAGNOSIS — K529 Noninfective gastroenteritis and colitis, unspecified: Secondary | ICD-10-CM | POA: Diagnosis not present

## 2019-09-15 NOTE — Progress Notes (Signed)
Patient: Micheal Wright Male    DOB: 1941-09-14   78 y.o.   MRN: [id number] Visit Date: 09/15/2019  Today's Provider: [name], MD   Chief Complaint  Patient presents with  . Weight Check   Subjective:    HPI Patient comes in today to follow up. He was last seen in the office 1 month ago. No medications were changed. He reports that he did have an episode of diarrhea for about 4 days, but feels this was food related.  He states he feels well but is anxious about his health.  He goes back to a story about when he was still working for the city of [employer] and was exposed to bloody rag in the sewer system.  He is worried that he had hepatitis from that.  No symptoms of this.  Nothing to suggest this was possible.  Wt Readings from Last 3 Encounters:  09/15/19 192 lb (87.1 kg)  08/04/19 196 lb 3.2 oz (89 kg)  06/02/19 195 lb 3.2 oz (88.5 kg)   BP Readings from Last 3 Encounters:  09/15/19 118/72  08/04/19 (!) 143/71  06/02/19 118/65     No Known Allergies   Current Outpatient Medications:  .  amLODipine (NORVASC) 5 MG tablet, TAKE 1 TABLET BY MOUTH ONCE DAILY, Disp: 30 tablet, Rfl: 5 .  atorvastatin (LIPITOR) 80 MG tablet, Take 1 tablet (80 mg total) by mouth daily. Needs appt for further refills, Disp: 30 tablet, Rfl: 2 .  ELIQUIS 5 MG TABS tablet, TAKE 1 TABLET BY MOUTH TWICE (2) DAILY, Disp: 180 tablet, Rfl: 1 .  hydrochlorothiazide (MICROZIDE) 12.5 MG capsule, TAKE 1 CAPSULE BY MOUTH ONCE DAILY, Disp: 90 capsule, Rfl: 3 .  losartan (COZAAR) 50 MG tablet, TAKE 1 TABLET BY MOUTH ONCE DAILY, Disp: 90 tablet, Rfl: 1 .  NITROSTAT 0.4 MG SL tablet, DISSOLVE 1 TABLET UNDER THE TONGUE FOR CHEST PAIN. MAY REPEAT EVERY 5MINUTES UP TO 3 DOSES. IF NO RELIEF, CALL 911**, Disp: 25 tablet, Rfl: 3 .  omeprazole (PRILOSEC) 40 MG capsule, TAKE 1 CAPSULE BY MOUTH ONCE DAILY, Disp: 30 capsule, Rfl: 12 .  ondansetron (ZOFRAN) 4 MG tablet, Take 1 tablet (4 mg total) by mouth every  8 (eight) hours as needed., Disp: 20 tablet, Rfl: 0 .  Sildenafil Citrate (VIAGRA PO), Take by mouth., Disp: , Rfl:  .  triamcinolone ointment (KENALOG) 0.1 %, Apply 1 application topically 2 (two) times daily., Disp: , Rfl:  .  ciprofloxacin (CIPRO) 250 MG tablet, Take 1 tablet (250 mg total) by mouth 2 (two) times daily. (Patient not taking: Reported on 09/15/2019), Disp: 14 tablet, Rfl: 0  Review of Systems  Constitutional: Positive for appetite change. Negative for activity change and fatigue.  HENT: Negative.   Eyes: Negative.   Respiratory: Negative for cough.   Cardiovascular: Negative.   Gastrointestinal: Negative for abdominal pain, constipation, diarrhea, nausea, rectal pain and vomiting.  Endocrine: Negative.   Genitourinary: Negative.   Musculoskeletal: Positive for arthralgias.  Allergic/Immunologic: Negative.   Neurological: Negative for dizziness and headaches.  Psychiatric/Behavioral: Negative.     Social History   Tobacco Use  . Smoking status: Never Smoker  . Smokeless tobacco: Never Used  Substance Use Topics  . Alcohol use: No      Objective:   BP 118/72   Pulse 90   Temp 98.4 F (36.9 C)   Resp 16   Wt 192 lb (87.1 kg)   SpO2  99%   BMI 27.55 kg/m  Vitals:   09/15/19 1114  BP: 118/72  Pulse: 90  Resp: 16  Temp: 98.4 F (36.9 C)  SpO2: 99%  Weight: 192 lb (87.1 kg)  Body mass index is 27.55 kg/m.   Physical Exam Vitals signs reviewed.  Constitutional:      Appearance: Normal appearance.  HENT:     Head: Normocephalic and atraumatic.     Right Ear: External ear normal.     Left Ear: External ear normal.     Nose: Nose normal.     Mouth/Throat:     Pharynx: Oropharynx is clear.  Eyes:     General: No scleral icterus.    Conjunctiva/sclera: Conjunctivae normal.  Cardiovascular:     Rate and Rhythm: Normal rate and regular rhythm.     Pulses: Normal pulses.     Heart sounds: Normal heart sounds.  Pulmonary:     Effort: Pulmonary  effort is normal.     Breath sounds: Normal breath sounds.  Abdominal:     Palpations: Abdomen is soft.  Musculoskeletal:     Right lower leg: No edema.     Left lower leg: No edema.  Skin:    General: Skin is warm and dry.  Neurological:     General: No focal deficit present.     Mental Status: He is alert and oriented to person, place, and time.  Psychiatric:        Mood and Affect: Mood normal.        Behavior: Behavior normal.        Thought Content: Thought content normal.        Judgment: Judgment normal.      No results found for any visits on 09/15/19.     Assessment & Plan    1. Weight loss I think the weight loss was from a gastroenteritis.  I think this is stopped now.  Weight stable 6 weeks now.  We will get testing to reassure patient about hepatitis status.  I think he is doing well.More than 50% 25 minute visit spent in counseling or coordination of care  - Hepatitis C antibody - Hepatitis B Surface AntiBODY  2. Gastroenteritis Resolved.  3. Myalgia Obtain CK.  I think he is lost some muscle mass just with aging.  Not much.  4. Other fatigue   5. Screening for HIV (human immunodeficiency virus)  6.CAD All risk factors treated.    Kimblery Diop Cranford Mon, MD  [facility]

## 2019-09-16 LAB — HEPATITIS C ANTIBODY: Hep C Virus Ab: <0.1 s/co ratio (ref 0.0–0.9)

## 2019-09-16 LAB — HEPATITIS B SURFACE ANTIBODY,QUALITATIVE: Hep B Surface Ab, Qual: NONREACTIVE

## 2019-09-29 NOTE — Progress Notes (Signed)
Subjective:   Micheal Wright is a 78 y.o. male who presents for Medicare Annual/Subsequent preventive examination.    This visit is being conducted through telemedicine due to the COVID-19 pandemic. This patient has given me verbal consent via doximity to conduct this visit, patient states they are participating from their home address. Some vital signs may be absent or patient reported.    Patient identification: identified by name, DOB, and current address  Review of Systems:  N/A  Cardiac Risk Factors include: advanced age (>72men, >51 women);dyslipidemia;hypertension;male gender     Objective:    Vitals: There were no vitals taken for this visit.  There is no height or weight on file to calculate BMI. Unable to obtain vitals due to visit being conducted via telephonically.   Advanced Directives 09/30/2019 04/29/2018 04/14/2018 03/19/2018 04/03/2016 11/14/2015 11/13/2015  Does Patient Have a Medical Advance Directive? Yes No No No No No No  Type of Paramedic of [place];Living will - - - - - -  Copy of [place] in Chart? No - copy requested - - - - - -  Would patient like information on creating a medical advance directive? - No - Patient declined No - Patient declined No - Patient declined - No - patient declined information No - patient declined information    Tobacco Social History   Tobacco Use  Smoking Status Former Smoker  Smokeless Tobacco Never Used  Tobacco Comment   quit around age [age], did not inhale     Counseling given: Not Answered Comment: quit around age [age], did not inhale   Clinical Intake:  Pre-visit preparation completed: Yes  Pain : No/denies pain Pain Score: 0-No pain     Nutritional Risks: None Diabetes: No  How often do you need to have someone help you when you read instructions, pamphlets, or other written materials from your doctor or pharmacy?: 1 - Never  Interpreter Needed?: No   Information entered by :: [facility], LPN  Past Medical History:  Diagnosis Date  . APC (atrial premature contractions) 05/04/2014  . 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 Atherosclerosis of coronary artery 11/08/2008   All risk factors treated.   . Atrial complex, premature 05/04/2014  . CAD (coronary artery disease)    s/p CABG 1998; Last cath 2002 in setting of marked ST elevation during stress testing. patent grafts; Echo 8/09 EF 55%. No significant valvular disease.  . Cardiac murmur 09/03/2011   Overview:  Overview:  Last Assessment & Plan:  Likely mild aortic valve sclerosis. Will check echo.    . Carotid artery disease ([place])    u/s 9/10: R 40-59% L 0-39%  . Carotid artery obstruction 08/22/2009   Overview:  Overview:  Qualifier: Diagnosis of  By: Haroldine Laws, MD, Eileen Stanford  Last Assessment & Plan:  Asymptomatic. Due for repeat u/s.  Overview:  Overview:  Qualifier: Diagnosis of  By: Haroldine Laws, MD, Eileen Stanford  Last Assessment & Plan:  Asymptomatic. Due for repeat u/s.    . Carotid stenosis 08/22/2009   Qualifier: Diagnosis of  By: Haroldine Laws, MD, Eileen Stanford Cerebrovascular disease 11/17/2008  . Chest tightness 01/20/2015  . CHF (congestive heart failure) ([place])   . Chronic atrial fibrillation ([place]) 03/02/2018  . Coronary artery disease 05/11/2007   Qualifier: Diagnosis of  By: Lurlean Nanny LPN, [place]    . Erectile dysfunction   . Essential hypertension 05/11/2007   Overview:  Overview:  Overview:  Qualifier: Diagnosis of  By: Lurlean Nanny LPN, [place]   Last Assessment & Plan:  BP up slightly here but when he checks at [employer] usually 120/65. Will continue current regimen.  Qualifier: Diagnosis of  By: Lurlean Nanny LPN, Reg[place]     . Family history of breast cancer   . Family history of colon cancer   . Family history of colonic polyps   . GERD (gastroesophageal reflux disease)   . History of colonic polyps   . Hypercholesteremia 11/08/2008  . Hyperlipidemia   . Hypertension   . Murmur, cardiac  09/03/2011  . PAC (premature atrial contraction) 05/04/2014  . 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 Sleep apnea    Past Surgical History:  Procedure Laterality Date  . ANGIOPLASTY     x 2  . APPENDECTOMY  1960's  . CARDIAC CATHETERIZATION  2002  . CARDIOVERSION N/A 11/14/2015   Procedure: CARDIOVERSION;  Surgeon: Jolaine Artist, MD;  Location[facility] ENDOSCOPY;  Service: Cardiovascular;  Laterality: N/A;  . COLONOSCOPY W/ POLYPECTOMY    . COLONOSCOPY WITH PROPOFOL N/A 11/13/2015   Procedure: COLONOSCOPY WITH PROPOFOL;  Surgeon: Manya Silvas, MD;  Location[facility] ENDOSCOPY;  Service: Endoscopy;  Laterality: N/A;  . COLONOSCOPY WITH PROPOFOL N/A 02/01/2019   Procedure: COLONOSCOPY WITH PROPOFOL;  Surgeon: Manya Silvas, MD;  Location: [facility] ENDOSCOPY;  Service: Endoscopy;  Laterality: N/A;  . CORONARY ARTERY BYPASS GRAFT  1998  . ESOPHAGOGASTRODUODENOSCOPY  04/2004   Inflam. at Z line (biopsy neg)  . ESOPHAGOGASTRODUODENOSCOPY N/A 02/01/2019   Procedure: ESOPHAGOGASTRODUODENOSCOPY (EGD);  Surgeon: Manya Silvas, MD;  Location[facility] ENDOSCOPY;  Service: Endoscopy;  Laterality: N/A;  . HERPES SIMPLEX VIRUS DFA N/A   . KNEE ARTHROSCOPY W/ PARTIAL MEDIAL MENISCECTOMY  1990s   Left knee  . MRI    . sleep study    . UVULOPALATOPHARYNGOPLASTY     Family History  Problem Relation Age of Onset  . Stroke Mother   . Heart attack Father   . Alcohol abuse Father   . Breast cancer Sister        24  . Heart disease Sister   . Rheumatologic disease Brother        Heart  . Heart disease Brother   . Breast cancer Maternal Aunt        dx 30s d. 68s  . Colon cancer Paternal Uncle        dx 75s  . Colon polyps Son        5-6  . Hypertension Neg Hx   . Diabetes Neg Hx   . Prostate cancer Neg Hx    Social History   Socioeconomic History  . Marital status: Married    Spouse name: Not on file  . Number of children: 3  . Years of education: Not on file  . Highest  education level: 12th grade  Occupational History  . 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 Financial resource strain: Not hard at all  . Food insecurity    Worry: Never true    Inability: Never true  . Transportation needs    Medical: No    Non-medical: No  Tobacco Use  . Smoking status: Former Research scientist (life sciences)  . Smokeless tobacco: Never Used  . Tobacco comment: quit around age [age], did not inhale  Substance and Sexual Activity  . Alcohol use: No  . Drug use: No  . Sexual activity: Never  Lifestyle  . Physical activity  Days per week: 0 days    Minutes per session: 0 min  . Stress: Not at all  Relationships  . Social Herbalist on phone: Patient refused    Gets together: Patient refused    Attends religious service: Patient refused    Active member of club or organization: Patient refused    Attends meetings of clubs or organizations: Patient refused    Relationship status: Patient refused  Other Topics Concern  . Not on file  Social History Narrative   Married with 3 children    Outpatient Encounter Medications as of 09/30/2019  Medication Sig  . amLODipine (NORVASC) 5 MG tablet TAKE 1 TABLET BY MOUTH ONCE DAILY  . atorvastatin (LIPITOR) 80 MG tablet Take 1 tablet (80 mg total) by mouth daily. Needs appt for further refills  . ELIQUIS 5 MG TABS tablet TAKE 1 TABLET BY MOUTH TWICE (2) DAILY  . hydrochlorothiazide (MICROZIDE) 12.5 MG capsule TAKE 1 CAPSULE BY MOUTH ONCE DAILY  . losartan (COZAAR) 50 MG tablet TAKE 1 TABLET BY MOUTH ONCE DAILY  . NITROSTAT 0.4 MG SL tablet DISSOLVE 1 TABLET UNDER THE TONGUE FOR CHEST PAIN. MAY REPEAT EVERY 5MINUTES UP TO 3 DOSES. IF NO RELIEF, CALL 911**  . omeprazole (PRILOSEC) 40 MG capsule TAKE 1 CAPSULE BY MOUTH ONCE DAILY  . ciprofloxacin (CIPRO) 250 MG tablet Take 1 tablet (250 mg total) by mouth 2 (two) times daily. (Patient not taking: Reported on 09/15/2019)  . ondansetron (ZOFRAN) 4 MG tablet Take 1  tablet (4 mg total) by mouth every 8 (eight) hours as needed. (Patient not taking: Reported on 09/30/2019)  . Sildenafil Citrate (VIAGRA PO) Take by mouth.  . triamcinolone ointment (KENALOG) 0.1 % Apply 1 application topically 2 (two) times daily.   No facility-administered encounter medications on file as of 09/30/2019.     Activities of Daily Living In your present state of health, do you have any difficulty performing the following activities: 09/30/2019 05/04/2019  Hearing? Tempie Donning  Comment Wears bilateral hearing aids. -  Vision? Y N  Comment Has trouble with night vision. -  Difficulty concentrating or making decisions? N N  Walking or climbing stairs? N N  Dressing or bathing? N N  Doing errands, shopping? N N  Preparing Food and eating ? N -  Using the Toilet? N -  In the past six months, have you accidently leaked urine? N -  Do you have problems with loss of bowel control? Y -  Comment Occasionally when using Miralax. -  Managing your Medications? N -  Managing your Finances? N -  Housekeeping or managing your Housekeeping? N -  Some recent data might be hidden    Patient Care Team: Jerrol Banana., MD as PCP - General (Unknown Physician Specialty) Bensimhon, Shaune Pascal, MD as Consulting Physician (Cardiology) Carloyn Manner, MD as Referring Physician (Otolaryngology) Jola Schmidt, MD as Consulting Physician (Ophthalmology) Roderick Pee as Physician Assistant Oneta Rack, MD (Dermatology)   Assessment:   This is a routine wellness examination for Danelle.  Exercise Activities and Dietary recommendations Current Exercise Habits: The patient does not participate in regular exercise at present, Exercise limited by: None identified  Goals    . DIET - REDUCE SALT INTAKE TO 2 GRAMS PER DAY OR LESS     Recommend decreasing amount of salt intake to 2 grams or less a day.        Fall Risk: Fall Risk  09/30/2019 09/30/2019 04/29/2018 04/23/2017  04/03/2016  Falls in the past year? 0 0 No No No  Number falls in past yr: 0 0 - - -  Injury with Fall? 0 0 - - -    FALL RISK PREVENTION PERTAINING TO THE HOME:  Any stairs in or around the home? Yes  If so, are there any without handrails? No   Home free of loose throw rugs in walkways, pet beds, electrical cords, etc? Yes  Adequate lighting in your home to reduce risk of falls? Yes   ASSISTIVE DEVICES UTILIZED TO PREVENT FALLS:  Life alert? No  Use of a cane, walker or w/c? No  Grab bars in the bathroom? No  Shower chair or bench in shower? No  Elevated toilet seat or a handicapped toilet? No   TIMED UP AND GO:  Was the test performed? No .    Depression Screen PHQ 2/9 Scores 09/30/2019 05/04/2019 05/04/2019 04/29/2018  PHQ - 2 Score 0 0 0 0  PHQ- 9 Score - 0 - -    Cognitive Function     6CIT Screen 09/30/2019 04/29/2018  What Year? 0 points 0 points  What month? 0 points 0 points  What time? 0 points 0 points  Count back from 20 0 points 0 points  Months in reverse 0 points 0 points  Repeat phrase 0 points 0 points  Total Score 0 0    Immunization History  Administered Date(s) Administered  . Influenza Split 08/23/2008, 09/14/2009, 09/24/2010, 08/19/2011, 08/17/2012  . Influenza Whole 10/02/2005  . Influenza, High Dose Seasonal PF 09/27/2014, 09/13/2015, 08/23/2016, 08/28/2017, 08/10/2018, 08/04/2019  . Influenza,inj,Quad PF,6+ Mos 08/07/2013  . Pneumococcal Conjugate-13 04/17/2015  . Pneumococcal Polysaccharide-23 08/23/2008  . Td 10/02/2002  . Tdap 04/17/2015  . Zoster Recombinat (Shingrix) 09/28/2018, 12/15/2018    Qualifies for Shingles Vaccine? Completed series  Tdap: Up to date  Flu Vaccine: Up to date  Pneumococcal Vaccine: Completed series  Screening Tests Health Maintenance  Topic Date Due  . TETANUS/TDAP  04/16/2025  . INFLUENZA VACCINE  Completed  . PNA vac Low Risk Adult  Completed   Cancer Screenings:  Colorectal Screening: No  longer required.   Lung Cancer Screening: (Low Dose CT Chest recommended if Age 78-80 years, 30 pack-year currently smoking OR have quit w/in 15years.) does not qualify.   Additional Screening:  Vision Screening: Recommended annual ophthalmology exams for early detection of glaucoma and other disorders of the eye.  Dental Screening: Recommended annual dental exams for proper oral hygiene  Community Resource Referral:  CRR required this visit?  No        Plan:  I have personally reviewed and addressed the Medicare Annual Wellness questionnaire and have noted the following in the patient's chart:  A. Medical and social history B. Use of alcohol, tobacco or illicit drugs  C. Current medications and supplements D. Functional ability and status E.  Nutritional status F.  Physical activity G. Advance directives H. List of other physicians I.  Hospitalizations, surgeries, and ER visits in previous 12 months J.  [place] such as hearing and vision if needed, cognitive and depression L. Referrals and appointments   In addition, I have reviewed and discussed with patient certain preventive protocols, quality metrics, and best practice recommendations. A written personalized care plan for preventive services as well as general preventive health recommendations were provided to patient.   Glendora Score, LPN  075-[facility] Nurse Health Advisor   Nurse  Notes: None.

## 2019-09-30 ENCOUNTER — Other Ambulatory Visit: Payer: Self-pay

## 2019-09-30 ENCOUNTER — Ambulatory Visit (INDEPENDENT_AMBULATORY_CARE_PROVIDER_SITE_OTHER): Payer: Medicare Other

## 2019-09-30 DIAGNOSIS — Z0000 Encounter for general adult medical examination without abnormal findings: Secondary | ICD-10-CM | POA: Diagnosis not present

## 2019-09-30 NOTE — Patient Instructions (Addendum)
Micheal Wright , Thank you for taking time to come for your Medicare Wellness Visit. I appreciate your ongoing commitment to your health goals. Please review the following plan we discussed and let me know if I can assist you in the future.   Screening recommendations/referrals: Colonoscopy: No longer required.  Recommended yearly ophthalmology/optometry visit for glaucoma screening and checkup Recommended yearly dental visit for hygiene and checkup  Vaccinations: Influenza vaccine: Up to date Pneumococcal vaccine: Completed series Tdap vaccine: Up to date, due 04/2025 Shingles vaccine: Completed series    Advanced directives: Please bring a copy of your POA (Power of Attorney) and/or Living Will to your next appointment.   Conditions/risks identified: Continue to cut back on salt intake and advised not to add to any food.   Next appointment: 11/15/19 @ 10:40 AM with Dr Rosanna Randy. Declined scheduling an AWV for 2021 at this time.   Preventive Care 10 Years and Older, Male Preventive care refers to lifestyle choices and visits with your health care provider that can promote health and wellness. What does preventive care include?  A yearly physical exam. This is also called an annual well check.  Dental exams once or twice a year.  Routine eye exams. Ask your health care provider how often you should have your eyes checked.  Personal lifestyle choices, including:  Daily care of your teeth and gums.  Regular physical activity.  Eating a healthy diet.  Avoiding tobacco and drug use.  Limiting alcohol use.  Practicing safe sex.  Taking low doses of aspirin every day.  Taking vitamin and mineral supplements as recommended by your health care provider. What happens during an annual well check? The services and screenings done by your health care provider during your annual well check will depend on your age, overall health, lifestyle risk factors, and family history of disease.  Counseling  Your health care provider may ask you questions about your:  Alcohol use.  Tobacco use.  Drug use.  Emotional well-being.  Home and relationship well-being.  Sexual activity.  Eating habits.  History of falls.  Memory and ability to understand (cognition).  Work and work Statistician. Screening  You may have the following tests or measurements:  Height, weight, and BMI.  Blood pressure.  Lipid and cholesterol levels. These may be checked every 5 years, or more frequently if you are over 52 years old.  Skin check.  Lung cancer screening. You may have this screening every year starting at age 52 if you have a 30-pack-year history of smoking and currently smoke or have quit within the past 15 years.  Fecal occult blood test (FOBT) of the stool. You may have this test every year starting at age 75.  Flexible sigmoidoscopy or colonoscopy. You may have a sigmoidoscopy every 5 years or a colonoscopy every 10 years starting at age 53.  Prostate cancer screening. Recommendations will vary depending on your family history and other risks.  Hepatitis C blood test.  Hepatitis B blood test.  Sexually transmitted disease (STD) testing.  Diabetes screening. This is done by checking your blood sugar (glucose) after you have not eaten for a while (fasting). You may have this done every 1-3 years.  Abdominal aortic aneurysm (AAA) screening. You may need this if you are a current or former smoker.  Osteoporosis. You may be screened starting at age 92 if you are at high risk. Talk with your health care provider about your test results, treatment options, and if necessary, the need  for more tests. Vaccines  Your health care provider may recommend certain vaccines, such as:  Influenza vaccine. This is recommended every year.  Tetanus, diphtheria, and acellular pertussis (Tdap, Td) vaccine. You may need a Td booster every 10 years.  Zoster vaccine. You may need this  after age 98.  Pneumococcal 13-valent conjugate (PCV13) vaccine. One dose is recommended after age 51.  Pneumococcal polysaccharide (PPSV23) vaccine. One dose is recommended after age 15. Talk to your health care provider about which screenings and vaccines you need and how often you need them. This information is not intended to replace advice given to you by your health care provider. Make sure you discuss any questions you have with your health care provider. Document Released: 12/15/2015 Document Revised: 08/07/2016 Document Reviewed: 09/19/2015 Elsevier Interactive Patient Education  2017 [place] Prevention in the Home Falls can cause injuries. They can happen to people of all ages. There are many things you can do to make your home safe and to help prevent falls. What can I do on the outside of my home?  Regularly fix the edges of walkways and driveways and fix any cracks.  Remove anything that might make you trip as you walk through a door, such as a raised step or threshold.  Trim any bushes or trees on the path to your home.  Use bright outdoor lighting.  Clear any walking paths of anything that might make someone trip, such as rocks or tools.  Regularly check to see if handrails are loose or broken. Make sure that both sides of any steps have handrails.  Any raised decks and porches should have guardrails on the edges.  Have any leaves, snow, or ice cleared regularly.  Use sand or salt on walking paths during winter.  Clean up any spills in your garage right away. This includes oil or grease spills. What can I do in the bathroom?  Use night lights.  Install grab bars by the toilet and in the tub and shower. Do not use towel bars as grab bars.  Use non-skid mats or decals in the tub or shower.  If you need to sit down in the shower, use a plastic, non-slip stool.  Keep the floor dry. Clean up any water that spills on the floor as soon as it happens.   Remove soap buildup in the tub or shower regularly.  Attach bath mats securely with double-sided non-slip rug tape.  Do not have throw rugs and other things on the floor that can make you trip. What can I do in the bedroom?  Use night lights.  Make sure that you have a light by your bed that is easy to reach.  Do not use any sheets or blankets that are too big for your bed. They should not hang down onto the floor.  Have a firm chair that has side arms. You can use this for support while you get dressed.  Do not have throw rugs and other things on the floor that can make you trip. What can I do in the kitchen?  Clean up any spills right away.  Avoid walking on wet floors.  Keep items that you use a lot in easy-to-reach places.  If you need to reach something above you, use a strong step stool that has a grab bar.  Keep electrical cords out of the way.  Do not use floor polish or wax that makes floors slippery. If you must use wax, use  non-skid floor wax.  Do not have throw rugs and other things on the floor that can make you trip. What can I do with my stairs?  Do not leave any items on the stairs.  Make sure that there are handrails on both sides of the stairs and use them. Fix handrails that are broken or loose. Make sure that handrails are as long as the stairways.  Check any carpeting to make sure that it is firmly attached to the stairs. Fix any carpet that is loose or worn.  Avoid having throw rugs at the top or bottom of the stairs. If you do have throw rugs, attach them to the floor with carpet tape.  Make sure that you have a light switch at the top of the stairs and the bottom of the stairs. If you do not have them, ask someone to add them for you. What else can I do to help prevent falls?  Wear shoes that:  Do not have high heels.  Have rubber bottoms.  Are comfortable and fit you well.  Are closed at the toe. Do not wear sandals.  If you use a  stepladder:  Make sure that it is fully opened. Do not climb a closed stepladder.  Make sure that both sides of the stepladder are locked into place.  Ask someone to hold it for you, if possible.  Clearly mark and make sure that you can see:  Any grab bars or handrails.  First and last steps.  Where the edge of each step is.  Use tools that help you move around (mobility aids) if they are needed. These include:  Canes.  Walkers.  Scooters.  Crutches.  Turn on the lights when you go into a dark area. Replace any light bulbs as soon as they burn out.  Set up your furniture so you have a clear path. Avoid moving your furniture around.  If any of your floors are uneven, fix them.  If there are any pets around you, be aware of where they are.  Review your medicines with your doctor. Some medicines can make you feel dizzy. This can increase your chance of falling. Ask your doctor what other things that you can do to help prevent falls. This information is not intended to replace advice given to you by your health care provider. Make sure you discuss any questions you have with your health care provider. Document Released: 09/14/2009 Document Revised: 04/25/2016 Document Reviewed: 12/23/2014 Elsevier Interactive Patient Education  2017 [employer].

## 2019-10-23 ENCOUNTER — Other Ambulatory Visit (HOSPITAL_COMMUNITY): Payer: Self-pay | Admitting: Internal Medicine

## 2019-10-23 DIAGNOSIS — I2510 Atherosclerotic heart disease of native coronary artery without angina pectoris: Secondary | ICD-10-CM

## 2019-10-27 ENCOUNTER — Other Ambulatory Visit: Payer: Self-pay | Admitting: Internal Medicine

## 2019-11-02 DIAGNOSIS — E278 Other specified disorders of adrenal gland: Secondary | ICD-10-CM | POA: Diagnosis not present

## 2019-11-02 DIAGNOSIS — D3501 Benign neoplasm of right adrenal gland: Secondary | ICD-10-CM | POA: Diagnosis not present

## 2019-11-04 ENCOUNTER — Other Ambulatory Visit: Payer: Self-pay | Admitting: "Endocrinology

## 2019-11-04 DIAGNOSIS — E278 Other specified disorders of adrenal gland: Secondary | ICD-10-CM

## 2019-11-04 DIAGNOSIS — D3501 Benign neoplasm of right adrenal gland: Secondary | ICD-10-CM

## 2019-11-12 NOTE — Progress Notes (Signed)
Patient: Micheal Wright Male    DOB: 23-Nov-1941   78 y.o.   MRN: [id number] Visit Date: 11/15/2019  Today's Provider: [name], MD   Chief Complaint  Patient presents with  . Follow-up   Subjective:     HPI  Patient now feels well.  His onl complaint is loss of muscle mass in the last couple of years.  He has gained 2 pounds since his last visit. Weight loss From 09/15/2019-labs normal.  No Known Allergies   Current Outpatient Medications:  .  amLODipine (NORVASC) 5 MG tablet, TAKE 1 TABLET BY MOUTH ONCE DAILY, Disp: 30 tablet, Rfl: 5 .  atorvastatin (LIPITOR) 80 MG tablet, TAKE 1 TABLET BY MOUTH DAILY, Disp: 30 tablet, Rfl: 2 .  ELIQUIS 5 MG TABS tablet, TAKE 1 TABLET BY MOUTH TWICE A DAY, Disp: 180 tablet, Rfl: 1 .  hydrochlorothiazide (MICROZIDE) 12.5 MG capsule, TAKE 1 CAPSULE BY MOUTH ONCE DAILY, Disp: 90 capsule, Rfl: 3 .  losartan (COZAAR) 50 MG tablet, TAKE 1 TABLET BY MOUTH ONCE A DAY, Disp: 90 tablet, Rfl: 1 .  NITROSTAT 0.4 MG SL tablet, DISSOLVE 1 TABLET UNDER THE TONGUE FOR CHEST PAIN. MAY REPEAT EVERY 5MINUTES UP TO 3 DOSES. IF NO RELIEF, CALL 911**, Disp: 25 tablet, Rfl: 3 .  omeprazole (PRILOSEC) 40 MG capsule, TAKE 1 CAPSULE BY MOUTH ONCE DAILY, Disp: 30 capsule, Rfl: 12 .  ciprofloxacin (CIPRO) 250 MG tablet, Take 1 tablet (250 mg total) by mouth 2 (two) times daily. (Patient not taking: Reported on 09/15/2019), Disp: 14 tablet, Rfl: 0 .  ondansetron (ZOFRAN) 4 MG tablet, Take 1 tablet (4 mg total) by mouth every 8 (eight) hours as needed. (Patient not taking: Reported on 09/30/2019), Disp: 20 tablet, Rfl: 0 .  Sildenafil Citrate (VIAGRA PO), Take by mouth., Disp: , Rfl:  .  triamcinolone ointment (KENALOG) 0.1 %, Apply 1 application topically 2 (two) times daily., Disp: , Rfl:   Review of Systems  Constitutional: Negative for appetite change, chills and fever.  Eyes: Negative.   Respiratory: Negative for chest tightness, shortness of breath and  wheezing.   Cardiovascular: Negative for chest pain and palpitations.  Gastrointestinal: Negative for abdominal pain, nausea and vomiting.  Endocrine: Negative.   Allergic/Immunologic: Negative.   Neurological: Negative.   Hematological: Negative.   Psychiatric/Behavioral: Negative.     Social History   Tobacco Use  . Smoking status: Former Research scientist (life sciences)  . Smokeless tobacco: Never Used  . Tobacco comment: quit around age [age], did not inhale  Substance Use Topics  . Alcohol use: No      Objective:   BP 133/76 (BP Location: Left Arm, Patient Position: Sitting, Cuff Size: Large)   Pulse 90   Temp (!) 97.3 F (36.3 C) (Other (Comment))   Resp 18   Ht 5\' 10"  (1.778 m)   Wt 195 lb (88.5 kg)   SpO2 97%   BMI 27.98 kg/m  Vitals:   11/15/19 1101  BP: 133/76  Pulse: 90  Resp: 18  Temp: (!) 97.3 F (36.3 C)  TempSrc: Other (Comment)  SpO2: 97%  Weight: 195 lb (88.5 kg)  Height: 5\' 10"  (1.778 m)  Body mass index is 27.98 kg/m.   Physical Exam Vitals reviewed.  Constitutional:      Appearance: Normal appearance.  HENT:     Head: Normocephalic and atraumatic.     Right Ear: External ear normal.     Left Ear: External ear  normal.     Nose: Nose normal.     Mouth/Throat:     Pharynx: Oropharynx is clear.  Eyes:     General: No scleral icterus.    Conjunctiva/sclera: Conjunctivae normal.  Cardiovascular:     Rate and Rhythm: Normal rate and regular rhythm.     Pulses: Normal pulses.     Heart sounds: Normal heart sounds.  Pulmonary:     Effort: Pulmonary effort is normal.     Breath sounds: Normal breath sounds.  Abdominal:     Palpations: Abdomen is soft.  Musculoskeletal:     Right lower leg: No edema.     Left lower leg: No edema.  Skin:    General: Skin is warm and dry.  Neurological:     General: No focal deficit present.     Mental Status: He is alert and oriented to person, place, and time.  Psychiatric:        Mood and Affect: Mood normal.         Behavior: Behavior normal.        Thought Content: Thought content normal.        Judgment: Judgment normal.      No results found for any visits on 11/15/19.     Assessment & Plan    1. Weight loss Issue issue has resolved.  He is gaining weight.  Follow-up in 6 months.  2. Essential hypertension This and lipid problem are both treated for CAD  3. Chronic atrial fibrillation (HCC) On Eliquis  4. Atherosclerosis of native coronary artery of native heart without angina pectoris All risk factors treated     [name], MD  [facility]

## 2019-11-15 ENCOUNTER — Encounter: Payer: Self-pay | Admitting: Family Medicine

## 2019-11-15 ENCOUNTER — Other Ambulatory Visit: Payer: Self-pay

## 2019-11-15 ENCOUNTER — Ambulatory Visit (INDEPENDENT_AMBULATORY_CARE_PROVIDER_SITE_OTHER): Payer: Medicare Other | Admitting: Family Medicine

## 2019-11-15 VITALS — BP 133/76 | HR 90 | Temp 97.3°F | Resp 18 | Ht 70.0 in | Wt 195.0 lb

## 2019-11-15 DIAGNOSIS — I25810 Atherosclerosis of coronary artery bypass graft(s) without angina pectoris: Secondary | ICD-10-CM

## 2019-11-15 DIAGNOSIS — I2510 Atherosclerotic heart disease of native coronary artery without angina pectoris: Secondary | ICD-10-CM

## 2019-11-15 DIAGNOSIS — I10 Essential (primary) hypertension: Secondary | ICD-10-CM | POA: Diagnosis not present

## 2019-11-15 DIAGNOSIS — I482 Chronic atrial fibrillation, unspecified: Secondary | ICD-10-CM

## 2019-11-15 DIAGNOSIS — R634 Abnormal weight loss: Secondary | ICD-10-CM

## 2019-11-17 ENCOUNTER — Ambulatory Visit
Admission: RE | Admit: 2019-11-17 | Discharge: 2019-11-17 | Disposition: A | Discharge status: Home / Self Care | Payer: Medicare Other | Source: Ambulatory Visit | Attending: "Endocrinology | Admitting: "Endocrinology

## 2019-11-17 ENCOUNTER — Other Ambulatory Visit: Payer: Self-pay

## 2019-11-17 DIAGNOSIS — D3501 Benign neoplasm of right adrenal gland: Secondary | ICD-10-CM | POA: Diagnosis not present

## 2019-11-17 DIAGNOSIS — E278 Other specified disorders of adrenal gland: Secondary | ICD-10-CM | POA: Insufficient documentation

## 2019-11-23 ENCOUNTER — Other Ambulatory Visit: Payer: Self-pay | Admitting: Internal Medicine

## 2019-12-27 ENCOUNTER — Other Ambulatory Visit: Payer: Self-pay | Admitting: Internal Medicine

## 2019-12-27 DIAGNOSIS — I2510 Atherosclerotic heart disease of native coronary artery without angina pectoris: Secondary | ICD-10-CM

## 2020-01-04 DIAGNOSIS — M542 Cervicalgia: Secondary | ICD-10-CM | POA: Insufficient documentation

## 2020-01-04 DIAGNOSIS — M25511 Pain in right shoulder: Secondary | ICD-10-CM | POA: Insufficient documentation

## 2020-01-04 DIAGNOSIS — M7501 Adhesive capsulitis of right shoulder: Secondary | ICD-10-CM | POA: Diagnosis not present

## 2020-01-04 DIAGNOSIS — M47812 Spondylosis without myelopathy or radiculopathy, cervical region: Secondary | ICD-10-CM | POA: Insufficient documentation

## 2020-01-04 DIAGNOSIS — M7541 Impingement syndrome of right shoulder: Secondary | ICD-10-CM | POA: Diagnosis not present

## 2020-01-10 DIAGNOSIS — M25511 Pain in right shoulder: Secondary | ICD-10-CM | POA: Diagnosis not present

## 2020-02-22 ENCOUNTER — Other Ambulatory Visit (HOSPITAL_COMMUNITY): Payer: Self-pay | Admitting: Internal Medicine

## 2020-02-28 DIAGNOSIS — K2950 Unspecified chronic gastritis without bleeding: Secondary | ICD-10-CM | POA: Diagnosis not present

## 2020-02-28 DIAGNOSIS — K5900 Constipation, unspecified: Secondary | ICD-10-CM | POA: Diagnosis not present

## 2020-02-28 DIAGNOSIS — K219 Gastro-esophageal reflux disease without esophagitis: Secondary | ICD-10-CM | POA: Diagnosis not present

## 2020-03-22 ENCOUNTER — Other Ambulatory Visit (HOSPITAL_COMMUNITY): Payer: Self-pay | Admitting: Internal Medicine

## 2020-03-30 DIAGNOSIS — L821 Other seborrheic keratosis: Secondary | ICD-10-CM | POA: Diagnosis not present

## 2020-03-30 DIAGNOSIS — Z85828 Personal history of other malignant neoplasm of skin: Secondary | ICD-10-CM | POA: Diagnosis not present

## 2020-03-30 DIAGNOSIS — D2262 Melanocytic nevi of left upper limb, including shoulder: Secondary | ICD-10-CM | POA: Diagnosis not present

## 2020-03-30 DIAGNOSIS — D2261 Melanocytic nevi of right upper limb, including shoulder: Secondary | ICD-10-CM | POA: Diagnosis not present

## 2020-03-30 DIAGNOSIS — X32XXXA Exposure to sunlight, initial encounter: Secondary | ICD-10-CM | POA: Diagnosis not present

## 2020-03-30 DIAGNOSIS — L728 Other follicular cysts of the skin and subcutaneous tissue: Secondary | ICD-10-CM | POA: Diagnosis not present

## 2020-03-30 DIAGNOSIS — D045 Carcinoma in situ of skin of trunk: Secondary | ICD-10-CM | POA: Diagnosis not present

## 2020-03-30 DIAGNOSIS — D2272 Melanocytic nevi of left lower limb, including hip: Secondary | ICD-10-CM | POA: Diagnosis not present

## 2020-03-30 DIAGNOSIS — L570 Actinic keratosis: Secondary | ICD-10-CM | POA: Diagnosis not present

## 2020-03-30 DIAGNOSIS — D485 Neoplasm of uncertain behavior of skin: Secondary | ICD-10-CM | POA: Diagnosis not present

## 2020-03-30 DIAGNOSIS — D225 Melanocytic nevi of trunk: Secondary | ICD-10-CM | POA: Diagnosis not present

## 2020-04-21 ENCOUNTER — Other Ambulatory Visit (HOSPITAL_COMMUNITY): Payer: Self-pay | Admitting: Internal Medicine

## 2020-04-24 ENCOUNTER — Other Ambulatory Visit (HOSPITAL_COMMUNITY): Payer: Self-pay | Admitting: Internal Medicine

## 2020-04-24 DIAGNOSIS — I2510 Atherosclerotic heart disease of native coronary artery without angina pectoris: Secondary | ICD-10-CM

## 2020-04-27 DIAGNOSIS — D045 Carcinoma in situ of skin of trunk: Secondary | ICD-10-CM | POA: Diagnosis not present

## 2020-05-31 NOTE — Progress Notes (Signed)
Established patient visit   Patient: Micheal Wright   DOB: [birth year]   79 y.o. Male  MRN: [id number] Visit Date: 06/12/2020  I,Jaycie Kregel S Eriko Economos,acting as a scribe for [name], MD.,have documented all relevant documentation on the behalf of [name], MD,as directed by  [name], MD while in the presence of [name], MD.  Today's healthcare provider: [name], MD   Chief Complaint  Patient presents with  . Hypertension   Subjective    HPI  Patient has been feeling fairly well.  He had a URI last week with 2 to 3 days of nonproductive cough and a T-max of 100.4.  Afebrile since then and the cough is resolved. 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  Not painful.  He is taking his daily medications as prescribed. 05/10/2019 PSA-2.1 02/01/2019 Colonoscopy-Polyps 02/01/2019 Pathology-Tubular Adenoma  Hypertension, follow-up  BP Readings from Last 3 Encounters:  06/12/20 126/66  06/01/20 105/65  11/15/19 133/76   Wt Readings from Last 3 Encounters:  06/12/20 190 lb 12.8 oz (86.5 kg)  06/01/20 191 lb 3.2 oz (86.7 kg)  11/15/19 195 lb (88.5 kg)     He was last seen for hypertension 7 months ago.  BP at that visit was 133/76. Management since that visit includes; This and lipid problem are both treated for CAD. He reports good compliance with treatment. He is not having side effects.  He is exercising. He is adherent to low salt diet.   Outside blood pressures are good.Marland Kitchen  He does not smoke.  Use of agents associated with hypertension: none.   --------------------------------------------------------------------------------------------------- Lipid/Cholesterol, Follow-up  Last lipid panel Other pertinent labs  Lab Results  Component Value Date   CHOL 86 (L) 05/10/2019   HDL 32 (L) 05/10/2019   LDLCALC 39 05/10/2019   TRIG 77 05/10/2019   CHOLHDL  2.7 05/10/2019   Lab Results  Component Value Date   ALT 16 05/10/2019   AST 21 08/04/2019   PLT 236 06/01/2020   TSH 1.030 05/10/2019     He was last seen for this 1 years ago.  Management since that visit includes no changes.  He reports good compliance with treatment. He is not having side effects.   Symptoms: No chest pain No chest pressure/discomfort  No dyspnea No lower extremity edema  Yes numbness or tingling of extremity No orthopnea  No palpitations No paroxysmal nocturnal dyspnea  No speech difficulty No syncope   Current diet: in general, a "healthy" diet   Current exercise: walking  The ASCVD Risk score (Freedom., et al., 2013) failed to calculate for the following reasons:   The valid total cholesterol range is 130 to 320 mg/dL  ---------------------------------------------------------------------------------------------------  Follow up for weight loss  The patient was last seen for this 6 months ago. Changes made at last visit include no changes.  He reports good compliance with treatment. He feels that condition is Unchanged. He is not having side effects.   -----------------------------------------------------------------------------------------   Patient Active Problem List   Diagnosis Date Noted  . Genetic testing 06/01/2019  . Family history of breast cancer   . Family history of colon cancer   . Family history of colonic polyps   . Chronic atrial fibrillation ([place]) 03/02/2018  . 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 Basal cell carcinoma of skin 10/16/2015  . Benign prostatic  hyperplasia with urinary obstruction 10/16/2015  . Neuropathy 10/16/2015  . Posthitis 01/10/2015  . PAC (premature atrial contraction) 05/04/2014  . Atrial complex, premature 05/04/2014  . APC (atrial premature contractions) 05/04/2014  . Blood in semen 08/31/2012  . Murmur, cardiac 09/03/2011  . Cardiac murmur 09/03/2011  . 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 DISTURBANCE OF SKIN SENSATION 12/21/2009  . Carotid stenosis 08/22/2009  . Carotid artery obstruction 08/22/2009  . Low back pain 03/24/2009  . Cerebrovascular disease 11/17/2008  . Atherosclerosis of coronary artery 11/08/2008  . Hypercholesteremia 11/08/2008  . COLD SORE 11/12/2007  . Herpes 11/12/2007  . Hyperlipidemia 05/11/2007  . ERECTILE DYSFUNCTION 05/11/2007  . Essential hypertension 05/11/2007  . Coronary artery disease 05/11/2007  . GERD 05/11/2007  . Sleep apnea 05/11/2007  . COLONIC POLYPS, HX OF 05/11/2007  . Gastro-esophageal reflux disease without esophagitis 05/11/2007  . History of colon polyps 05/11/2007   Past Surgical History:  Procedure Laterality Date  . ANGIOPLASTY     x 2  . APPENDECTOMY  1960's  . CARDIAC CATHETERIZATION  2002  . CARDIOVERSION N/A 11/14/2015   Procedure: CARDIOVERSION;  Surgeon: Jolaine Artist, MD;  Location: [facility] ENDOSCOPY;  Service: Cardiovascular;  Laterality: N/A;  . COLONOSCOPY W/ POLYPECTOMY    . COLONOSCOPY WITH PROPOFOL N/A 11/13/2015   Procedure: COLONOSCOPY WITH PROPOFOL;  Surgeon: Manya Silvas, MD;  Location: [facility] ENDOSCOPY;  Service: Endoscopy;  Laterality: N/A;  . COLONOSCOPY WITH PROPOFOL N/A 02/01/2019   Procedure: COLONOSCOPY WITH PROPOFOL;  Surgeon: Manya Silvas, MD;  Location: [facility] ENDOSCOPY;  Service: Endoscopy;  Laterality: N/A;  . CORONARY ARTERY BYPASS GRAFT  1998  . ESOPHAGOGASTRODUODENOSCOPY  04/2004   Inflam. at Z line (biopsy neg)  . ESOPHAGOGASTRODUODENOSCOPY N/A 02/01/2019   Procedure: ESOPHAGOGASTRODUODENOSCOPY (EGD);  Surgeon: Manya Silvas, MD;  Location: [facility] ENDOSCOPY;  Service: Endoscopy;  Laterality: N/A;  . HERPES SIMPLEX VIRUS DFA N/A   . KNEE ARTHROSCOPY W/ PARTIAL MEDIAL MENISCECTOMY  1990s   Left knee  . MRI    . sleep study    . UVULOPALATOPHARYNGOPLASTY     Social History    Socioeconomic History  . Marital status: Married    Spouse name: Not on file  . Number of children: 3  . Years of education: Not on file  . Highest education level: 12th grade  Occupational History  . Occupation: Press photographer, Academic librarian parts - Retired  Tobacco Use  . Smoking status: Former Research scientist (life sciences)  . Smokeless tobacco: Never Used  . Tobacco comment: quit around age [age], did not inhale  Vaping Use  . Vaping Use: Never used  Substance and Sexual Activity  . Alcohol use: No  . Drug use: No  . Sexual activity: Never  Other Topics Concern  . Not on file  Social History Narrative   Married with 3 children   Social Determinants of Health   Financial Resource Strain:   . Difficulty of Paying Living Expenses:   Food Insecurity:   . Worried About Charity fundraiser in the Last Year:   . Arboriculturist in the Last Year:   Transportation Needs:   . Film/video editor (Medical):   Marland Kitchen Lack of Transportation (Non-Medical):   Physical Activity: Inactive  . Days of Exercise per Week: 0 days  . Minutes of Exercise per Session: 0 min  Stress:   . Feeling of Stress :   Social Connections:  Unknown  . Frequency of Communication with Friends and Family: Patient refused  . Frequency of Social Gatherings with Friends and Family: Patient refused  . Attends Religious Services: Patient refused  . Active Member of Clubs or Organizations: Patient refused  . Attends Archivist Meetings: Patient refused  . Marital Status: Patient refused  Intimate Partner Violence: Unknown  . Fear of Current or Ex-Partner: Patient refused  . Emotionally Abused: Patient refused  . Physically Abused: Patient refused  . Sexually Abused: Patient refused   Family History  Problem Relation Age of Onset  . Stroke Mother   . Heart attack Father   . Alcohol abuse Father   . Breast cancer Sister        4  . Heart disease Sister   . Rheumatologic disease Brother        Heart  . Heart disease Brother   .  Breast cancer Maternal Aunt        dx 30s d. 47s  . Colon cancer Paternal Uncle        dx 80s  . Colon polyps Son        5-6  . Hypertension Neg Hx   . Diabetes Neg Hx   . Prostate cancer Neg Hx    No Known Allergies     Medications: Outpatient Medications Prior to Visit  Medication Sig  . amLODipine (NORVASC) 5 MG tablet TAKE 1 TABLET BY MOUTH ONCE A DAY  . atorvastatin (LIPITOR) 80 MG tablet TAKE 1 TABLET BY MOUTH DAILY  . ELIQUIS 5 MG TABS tablet TAKE 1 TABLET BY MOUTH TWICE A DAY  . hydrochlorothiazide (MICROZIDE) 12.5 MG capsule Take 1 capsule (12.5 mg total) by mouth daily.  Marland Kitchen losartan (COZAAR) 50 MG tablet TAKE 1 TABLET BY MOUTH ONCE A DAY  . NITROSTAT 0.4 MG SL tablet DISSOLVE 1 TABLET UNDER THE TONGUE FOR CHEST PAIN. MAY REPEAT EVERY 5MINUTES UP TO 3 DOSES. IF NO RELIEF, CALL 911**  . omeprazole (PRILOSEC) 40 MG capsule TAKE 1 CAPSULE BY MOUTH ONCE DAILY   No facility-administered medications prior to visit.    Review of Systems  Constitutional: Negative.   HENT: Positive for hearing loss and tinnitus.   Eyes: Negative.   Respiratory: Negative.   Cardiovascular: Negative.   Gastrointestinal: Negative.   Endocrine: Negative.   Genitourinary: Negative.   Musculoskeletal: Positive for arthralgias.  Skin: Negative.   Allergic/Immunologic: Negative.   Neurological: Positive for numbness.  Hematological: Negative.   Psychiatric/Behavioral: Negative.     Last CBC Lab Results  Component Value Date   WBC 6.0 06/01/2020   HGB 13.1 06/01/2020   HCT 39.2 06/01/2020   MCV 96.3 06/01/2020   MCH 32.2 06/01/2020   RDW 12.5 06/01/2020   PLT 236 89/38/1017   Last metabolic panel Lab Results  Component Value Date   GLUCOSE 101 (H) 06/01/2020   NA 138 06/01/2020   K 3.8 06/01/2020   CL 104 06/01/2020   CO2 24 06/01/2020   BUN 12 06/01/2020   CREATININE 1.03 06/01/2020   GFRNONAA >60 06/01/2020   GFRAA >60 06/01/2020   CALCIUM 9.2 06/01/2020   PHOS 2.9  11/13/2007   PROT 6.8 08/04/2019   ALBUMIN 4.1 08/04/2019   LABGLOB 2.7 08/04/2019   AGRATIO 1.5 08/04/2019   BILITOT 0.9 08/04/2019   ALKPHOS 120 (H) 08/04/2019   AST 21 08/04/2019   ALT 16 05/10/2019   ANIONGAP 10 06/01/2020   Last lipids Lab Results  Component Value Date  CHOL 86 (L) 05/10/2019   HDL 32 (L) 05/10/2019   LDLCALC 39 05/10/2019   TRIG 77 05/10/2019   CHOLHDL 2.7 05/10/2019   Last hemoglobin A1c Lab Results  Component Value Date   HGBA1C <4.3 (L) 04/09/2016   Last thyroid functions Lab Results  Component Value Date   TSH 1.030 05/10/2019     Objective    BP 126/66 (BP Location: Right Arm, Patient Position: Sitting, Cuff Size: Normal)   Pulse 76   Temp (!) 97.5 F (36.4 C) (Temporal)   Resp 16   Ht 5\' 10"  (1.778 m)   Wt 190 lb 12.8 oz (86.5 kg)   BMI 27.38 kg/m  BP Readings from Last 3 Encounters:  06/12/20 126/66  06/01/20 105/65  11/15/19 133/76   Wt Readings from Last 3 Encounters:  06/12/20 190 lb 12.8 oz (86.5 kg)  06/01/20 191 lb 3.2 oz (86.7 kg)  11/15/19 195 lb (88.5 kg)      Physical Exam Vitals reviewed.  Constitutional:      Appearance: Normal appearance.  HENT:     Head: Normocephalic and atraumatic.     Right Ear: External ear normal.     Left Ear: External ear normal.     Nose: Nose normal.     Mouth/Throat:     Pharynx: Oropharynx is clear.  Eyes:     General: No scleral icterus.    Conjunctiva/sclera: Conjunctivae normal.  Cardiovascular:     Rate and Rhythm: Normal rate and regular rhythm.     Pulses: Normal pulses.     Heart sounds: Normal heart sounds.  Pulmonary:     Effort: Pulmonary effort is normal.     Breath sounds: Normal breath sounds.  Abdominal:     Palpations: Abdomen is soft.  Musculoskeletal:     Right lower leg: No edema.     Left lower leg: No edema.  Skin:    General: Skin is warm and dry.  Neurological:     General: No focal deficit present.     Mental Status: He is alert and  oriented to person, place, and time.  Psychiatric:        Mood and Affect: Mood normal.        Behavior: Behavior normal.        Thought Content: Thought content normal.        Judgment: Judgment normal.       No results found for any visits on 06/12/20.  Assessment & Plan     1. Essential hypertension Good control. - Comprehensive metabolic panel  2. Hypercholesteremia On atorvastatin 80 - Lipid Panel With LDL/HDL Ratio  3. Anemia, unspecified type  - CBC with Differential/Platelet - TSH  4. Benign prostatic hyperplasia with urinary frequency No more PSA is an 79 year old.  He will be 80 tomorrow. - PSA  5. Chronic atrial fibrillation (HCC)  - CBC with Differential/Platelet - Vitamin B12  6. Neuropathy Clinically stable. - Vitamin B12  7. Unspecified protein-calorie malnutrition ([place])  Work on balanced diet. - Vitamin B12  8. Postsurgical aortocoronary bypass status All risk factors treated No follow-ups on file.      I, [name], MD, have reviewed all documentation for this visit. The documentation on 06/12/20 for the exam, diagnosis, procedures, and orders are all accurate and complete.    Richard Cranford Mon, MD  [facility] [phone] (phone) [phone] (fax)  [place]

## 2020-06-01 ENCOUNTER — Encounter (HOSPITAL_COMMUNITY): Payer: Self-pay | Admitting: Internal Medicine

## 2020-06-01 ENCOUNTER — Ambulatory Visit (HOSPITAL_COMMUNITY)
Admission: RE | Admit: 2020-06-01 | Discharge: 2020-06-01 | Disposition: A | Discharge status: Home / Self Care | Payer: Medicare Other | Source: Ambulatory Visit | Attending: Internal Medicine | Admitting: Internal Medicine

## 2020-06-01 ENCOUNTER — Other Ambulatory Visit: Payer: Self-pay

## 2020-06-01 VITALS — BP 105/65 | HR 68 | Wt 191.2 lb

## 2020-06-01 DIAGNOSIS — Z79899 Other long term (current) drug therapy: Secondary | ICD-10-CM | POA: Insufficient documentation

## 2020-06-01 DIAGNOSIS — I110 Hypertensive heart disease with heart failure: Secondary | ICD-10-CM | POA: Diagnosis not present

## 2020-06-01 DIAGNOSIS — I2510 Atherosclerotic heart disease of native coronary artery without angina pectoris: Secondary | ICD-10-CM | POA: Diagnosis not present

## 2020-06-01 DIAGNOSIS — Z8249 Family history of ischemic heart disease and other diseases of the circulatory system: Secondary | ICD-10-CM | POA: Diagnosis not present

## 2020-06-01 DIAGNOSIS — Z8349 Family history of other endocrine, nutritional and metabolic diseases: Secondary | ICD-10-CM | POA: Insufficient documentation

## 2020-06-01 DIAGNOSIS — I6529 Occlusion and stenosis of unspecified carotid artery: Secondary | ICD-10-CM

## 2020-06-01 DIAGNOSIS — I480 Paroxysmal atrial fibrillation: Secondary | ICD-10-CM | POA: Diagnosis not present

## 2020-06-01 DIAGNOSIS — E7800 Pure hypercholesterolemia, unspecified: Secondary | ICD-10-CM | POA: Diagnosis not present

## 2020-06-01 DIAGNOSIS — Z7901 Long term (current) use of anticoagulants: Secondary | ICD-10-CM | POA: Diagnosis not present

## 2020-06-01 DIAGNOSIS — Z951 Presence of aortocoronary bypass graft: Secondary | ICD-10-CM | POA: Insufficient documentation

## 2020-06-01 DIAGNOSIS — I10 Essential (primary) hypertension: Secondary | ICD-10-CM

## 2020-06-01 DIAGNOSIS — E785 Hyperlipidemia, unspecified: Secondary | ICD-10-CM | POA: Diagnosis not present

## 2020-06-01 DIAGNOSIS — I482 Chronic atrial fibrillation, unspecified: Secondary | ICD-10-CM | POA: Diagnosis not present

## 2020-06-01 DIAGNOSIS — I5032 Chronic diastolic (congestive) heart failure: Secondary | ICD-10-CM | POA: Diagnosis present

## 2020-06-01 DIAGNOSIS — I6523 Occlusion and stenosis of bilateral carotid arteries: Secondary | ICD-10-CM | POA: Diagnosis not present

## 2020-06-01 DIAGNOSIS — R001 Bradycardia, unspecified: Secondary | ICD-10-CM | POA: Insufficient documentation

## 2020-06-01 DIAGNOSIS — G4730 Sleep apnea, unspecified: Secondary | ICD-10-CM | POA: Insufficient documentation

## 2020-06-01 LAB — CBC
HCT: 39.2 % (ref 39.0–52.0)
Hemoglobin: 13.1 g/dL (ref 13.0–17.0)
MCH: 32.2 pg (ref 26.0–34.0)
MCHC: 33.4 g/dL (ref 30.0–36.0)
MCV: 96.3 fL (ref 80.0–100.0)
Platelets: 236 10*3/uL (ref 150–400)
RBC: 4.07 MIL/uL — ABNORMAL LOW (ref 4.22–5.81)
RDW: 12.5 % (ref 11.5–15.5)
WBC: 6 10*3/uL (ref 4.0–10.5)
nRBC: 0 % (ref 0.0–0.2)

## 2020-06-01 LAB — BASIC METABOLIC PANEL
Anion gap: 10 (ref 5–15)
BUN: 12 mg/dL (ref 8–23)
CO2: 24 mmol/L (ref 22–32)
Calcium: 9.2 mg/dL (ref 8.9–10.3)
Chloride: 104 mmol/L (ref 98–111)
Creatinine, Ser: 1.03 mg/dL (ref 0.61–1.24)
GFR calc Af Amer: >60 mL/min (ref >60)
GFR calc non Af Amer: >60 mL/min (ref >60)
Glucose, Bld: 101 mg/dL — ABNORMAL HIGH (ref 70–99)
Potassium: 3.8 mmol/L (ref 3.5–5.1)
Sodium: 138 mmol/L (ref 135–145)

## 2020-06-01 NOTE — Progress Notes (Signed)
Blood collected for TTR genetic testing per Dr Bensimhon.  Order form completed and both shipped by [employer] to [facility].  

## 2020-06-01 NOTE — Addendum Note (Signed)
Encounter addended by: Scarlette Calico, RN on: 06/01/2020 1:18 PM  Actions taken: Visit diagnoses modified, Order list changed, Diagnosis association updated, Clinical Note Signed, Charge Capture section accepted

## 2020-06-01 NOTE — Progress Notes (Signed)
Advanced Heart Failure Clinic Note   PCP: Dr Rosanna Randy HPI:  Micheal Wright is a 79 y.o. male with a history of coronary artery disease status post coronary artery bypass grafting in 1998.  Last catheterization was in January 2002 due to an abnormal stress test with 4 mm of ST elevation during exercise.  Cardiac catheterization showed patent grafts.  He has a history of hypertension, PAF, hyperlipidemia, and sleep apnea. Myoview 2/09 EF 61% No ischemia.   Had episode PAF in 12/16 during colonoscopy. Underwent DC-CV. Started Eliquis.   Today he returns for HF follow up. Feeling pretty good. Stays active. Still struggling with ringing in his ears. No CP or SOB. No edema. No palpitations. No bleeding on eliquis.   Studies:  Sleep Study  04/03/2018 . CPAP Titration completed 04/14/2018   PYP 03/26/2018   H/CL 1.14 Visual grade 2. Equivocal  cMRI 5/19: EF 52% mid myocardial gadolinium uptake on delayed inversion recovery sequences in the septum, apex and inferior base. Reviewed by Drs. Meda Coffee and Maize and not felt c/w amyloid  Echo 03/02/2018  LVEF 55-60% Mild RV dilation. Mild to mod TR. RVSP not elevated on echo. Personally reviewed  Echo 01/2015 EF 60-65%, no AR, Mild MR/TR, PA peak pressure 33 mmHg, GLPSS -21% LA size 3.8cm Carotid US 01/2015 with minimal plaque bilaterally.  Carotid u/s 11/2012  R ok 40-59% (stable).                       3/16: 1-39% bilaterally ABI 12/2009  R 1.0  L 1.1    Review of systems complete and found to be negative unless listed in HPI.    Past Medical History:  Diagnosis Date  . APC (atrial premature contractions) 05/04/2014  . 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 Atherosclerosis of coronary artery 11/08/2008   All risk factors treated.   . Atrial complex, premature 05/04/2014  . CAD (coronary artery disease)    s/p CABG 1998; Last cath 2002 in setting of marked ST elevation during stress testing. patent grafts; Echo 8/09 EF 55%. No significant valvular  disease.  . Cardiac murmur 09/03/2011   Overview:  Overview:  Last Assessment & Plan:  Likely mild aortic valve sclerosis. Will check echo.    . Carotid artery disease ([place])    u/s 9/10: R 40-59% L 0-39%  . Carotid artery obstruction 08/22/2009   Overview:  Overview:  Qualifier: Diagnosis of  By: Haroldine Laws, MD, Eileen Stanford  Last Assessment & Plan:  Asymptomatic. Due for repeat u/s.  Overview:  Overview:  Qualifier: Diagnosis of  By: Haroldine Laws, MD, Eileen Stanford  Last Assessment & Plan:  Asymptomatic. Due for repeat u/s.    . Carotid stenosis 08/22/2009   Qualifier: Diagnosis of  By: Haroldine Laws, MD, Eileen Stanford Cerebrovascular disease 11/17/2008  . Chest tightness 01/20/2015  . CHF (congestive heart failure) ([place])   . Chronic atrial fibrillation ([place]) 03/02/2018  . Coronary artery disease 05/11/2007   Qualifier: Diagnosis of  By: Lurlean Nanny LPN, Regina    . Erectile dysfunction   . Essential hypertension 05/11/2007   Overview:  Overview:  Overview:  Qualifier: Diagnosis of  By: Lurlean Nanny LPN, Regina   Last Assessment & Plan:  BP up slightly here but when he checks at [employer] usually 120/65. Will continue current regimen.  Qualifier: Diagnosis of  By: Lurlean Nanny LPN, Regina     . Family history of breast cancer   .  Family history of colon cancer   . Family history of colonic polyps   . GERD (gastroesophageal reflux disease)   . History of colonic polyps   . Hypercholesteremia 11/08/2008  . Hyperlipidemia   . Hypertension   . Murmur, cardiac 09/03/2011  . PAC (premature atrial contraction) 05/04/2014  . 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 Sleep apnea     Current Outpatient Medications  Medication Sig Dispense Refill  . amLODipine (NORVASC) 5 MG tablet TAKE 1 TABLET BY MOUTH ONCE A DAY 30 tablet 5  . atorvastatin (LIPITOR) 80 MG tablet TAKE 1 TABLET BY MOUTH DAILY 90 tablet 0  . ELIQUIS 5 MG TABS tablet TAKE 1 TABLET BY MOUTH TWICE A DAY 180 tablet 0  . hydrochlorothiazide (MICROZIDE)  12.5 MG capsule Take 1 capsule (12.5 mg total) by mouth daily. 90 capsule 0  . losartan (COZAAR) 50 MG tablet TAKE 1 TABLET BY MOUTH ONCE A DAY 90 tablet 0  . NITROSTAT 0.4 MG SL tablet DISSOLVE 1 TABLET UNDER THE TONGUE FOR CHEST PAIN. MAY REPEAT EVERY 5MINUTES UP TO 3 DOSES. IF NO RELIEF, CALL 911** 25 tablet 3  . omeprazole (PRILOSEC) 40 MG capsule TAKE 1 CAPSULE BY MOUTH ONCE DAILY 30 capsule 12   No current facility-administered medications for this encounter.   Vitals:   06/01/20 1240  BP: 105/65  Pulse: 68  SpO2: 97%  Weight: 86.7 kg (191 lb 3.2 oz)   Wt Readings from Last 3 Encounters:  06/01/20 86.7 kg (191 lb 3.2 oz)  11/15/19 88.5 kg (195 lb)  09/15/19 87.1 kg (192 lb)    PHYSICAL EXAM: General:  Well appearing. No resp difficulty HEENT: normal Neck: supple. no JVD. Carotids 2+ bilat; no bruits. No lymphadenopathy or thryomegaly appreciated. Cor: PMI nondisplaced. irregular rate & rhythm. No rubs, gallops or murmurs. Lungs: clear Abdomen: soft, nontender, nondistended. No hepatosplenomegaly. No bruits or masses. Good bowel sounds. Extremities: no cyanosis, clubbing, rash, edema Neuro: alert & orientedx3, cranial nerves grossly intact. moves all 4 extremities w/o difficulty. Affect pleasant  ASSESSMENT & PLAN:  1) LE edema - Resolved with addition of HCTZ - likely mixed picture between dependent edema (with LE varicosities) but also has some RV strain on echo and MRI  - unable to use spiro due to hyperkalemia.  -PYP equivocal for TTR amyloidosis Grade 2. Quanitive 1.14 - cMRI 5/19: LVEF 52% mid myocardial gadolinium uptake on delayed inversion recovery sequences in the septum, apex and inferior base. RV 28%  Reviewed by Drs. Meda Coffee and McColl and not felt c/w amyloid  - Overall doing great but I am still not completely convinced that he doesn't have TTR amyloid. I will repeat PYP & echo for comparison. RV dysfunction may be related to OSA (but this is mild) or previous  CAD 2) Chronic a fib.  - Rate controlled. On Eliquis. No bleeding  - CHADS VASC 4 (age =2. CAD, HTN).  3) CAD, s/p CABG 1998 - No s/s of ischemia - Myoview 3/16 OK.  4) HTN - Improved with HCTZ. SBP at home 120-140 - Continue losartan 50 mg daily - Continue amlodipine 5 mg daily. -  Echo 03/02/2018 LVEF 55-60%, and mild RV dysfunction.  5) HL - Managed by Dr. Rosanna Randy.  6) Bradycardia - BB previously stopped. Stable.  7) Carotid stenosis, mild - US Carotid 05/28/2017 Bilateral 1-39%. No need for f/u testing at this point 8) Sleep Apnea - Completed sleep study 5/32019 Mild OSA AHI 6.1 - Has stopped  using CPAP. Snoring improved with 10 pound weight loss.  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

## 2020-06-01 NOTE — Addendum Note (Signed)
Encounter addended by: Scarlette Calico, RN on: 06/01/2020 2:36 PM  Actions taken: Clinical Note Signed

## 2020-06-01 NOTE — Patient Instructions (Signed)
Labs done today, your results will be available in MyChart, we will contact you for abnormal readings.  Your physician has requested that you have an echocardiogram. Echocardiography is a painless test that uses sound waves to create images of your heart. It provides your doctor with information about the size and shape of your heart and how well your heart's chambers and valves are working. This procedure takes approximately one hour. There are no restrictions for this procedure. AT [facility], they will call you to schedule this  You have been ordered a PYP Scan.  This is done in the Radiology Department of [facility].  When you come for this test please plan to be there 2-3 hours. ONCE APPROVED WITH INSURANCE, WE WILL CALL YOU TO SCHEDULE THIS.  Genetic test has been done, this has to be sent to [place] to be processed and can take 1-2 weeks to get results back.  We will let you know the results.  PLEASE call our office in 1 year to schedule your follow up appointment  If you have any questions or concerns before your next appointment please send [place] a message through [place] or call our office at [phone].    TO LEAVE A MESSAGE FOR THE NURSE SELECT OPTION 2, PLEASE LEAVE A MESSAGE INCLUDING: . YOUR NAME . DATE OF BIRTH . CALL BACK NUMBER . REASON FOR CALL**this is important as we prioritize the call backs  YOU WILL RECEIVE A CALL BACK THE SAME DAY AS LONG AS YOU CALL BEFORE 4:00 PM

## 2020-06-09 NOTE — Patient Instructions (Signed)

## 2020-06-12 ENCOUNTER — Ambulatory Visit (INDEPENDENT_AMBULATORY_CARE_PROVIDER_SITE_OTHER): Payer: Medicare Other | Admitting: Family Medicine

## 2020-06-12 ENCOUNTER — Other Ambulatory Visit: Payer: Self-pay

## 2020-06-12 ENCOUNTER — Encounter: Payer: Self-pay | Admitting: Family Medicine

## 2020-06-12 VITALS — BP 126/66 | HR 76 | Temp 97.5°F | Resp 16 | Ht 70.0 in | Wt 190.8 lb

## 2020-06-12 DIAGNOSIS — G629 Polyneuropathy, unspecified: Secondary | ICD-10-CM

## 2020-06-12 DIAGNOSIS — I10 Essential (primary) hypertension: Secondary | ICD-10-CM

## 2020-06-12 DIAGNOSIS — D649 Anemia, unspecified: Secondary | ICD-10-CM

## 2020-06-12 DIAGNOSIS — I2510 Atherosclerotic heart disease of native coronary artery without angina pectoris: Secondary | ICD-10-CM | POA: Diagnosis not present

## 2020-06-12 DIAGNOSIS — N401 Enlarged prostate with lower urinary tract symptoms: Secondary | ICD-10-CM

## 2020-06-12 DIAGNOSIS — Z951 Presence of aortocoronary bypass graft: Secondary | ICD-10-CM | POA: Diagnosis not present

## 2020-06-12 DIAGNOSIS — E7800 Pure hypercholesterolemia, unspecified: Secondary | ICD-10-CM | POA: Diagnosis not present

## 2020-06-12 DIAGNOSIS — E46 Unspecified protein-calorie malnutrition: Secondary | ICD-10-CM

## 2020-06-12 DIAGNOSIS — I482 Chronic atrial fibrillation, unspecified: Secondary | ICD-10-CM

## 2020-06-12 DIAGNOSIS — R350 Frequency of micturition: Secondary | ICD-10-CM

## 2020-06-14 LAB — CBC WITH DIFFERENTIAL/PLATELET
Basophils Absolute: 0.1 10*3/uL (ref 0.0–0.2)
Basos: 1 %
EOS (ABSOLUTE): 0.2 10*3/uL (ref 0.0–0.4)
Eos: 3 %
Hematocrit: 39.1 % (ref 37.5–51.0)
Hemoglobin: 13.4 g/dL (ref 13.0–17.7)
Immature Grans (Abs): 0 10*3/uL (ref 0.0–0.1)
Immature Granulocytes: 0 %
Lymphocytes Absolute: 2.6 10*3/uL (ref 0.7–3.1)
Lymphs: 42 %
MCH: 32.6 pg (ref 26.6–33.0)
MCHC: 34.3 g/dL (ref 31.5–35.7)
MCV: 95 fL (ref 79–97)
Monocytes Absolute: 0.7 10*3/uL (ref 0.1–0.9)
Monocytes: 11 %
Neutrophils Absolute: 2.6 10*3/uL (ref 1.4–7.0)
Neutrophils: 43 %
Platelets: 283 10*3/uL (ref 150–450)
RBC: 4.11 x10E6/uL — ABNORMAL LOW (ref 4.14–5.80)
RDW: 12.5 % (ref 11.6–15.4)
WBC: 6.1 10*3/uL (ref 3.4–10.8)

## 2020-06-14 LAB — COMPREHENSIVE METABOLIC PANEL
ALT: 14 IU/L (ref 0–44)
AST: 18 IU/L (ref 0–40)
Albumin/Globulin Ratio: 1.4 (ref 1.2–2.2)
Albumin: 4.3 g/dL (ref 3.7–4.7)
Alkaline Phosphatase: 126 IU/L — ABNORMAL HIGH (ref 48–121)
BUN/Creatinine Ratio: 9 — ABNORMAL LOW (ref 10–24)
BUN: 10 mg/dL (ref 8–27)
Bilirubin Total: 0.9 mg/dL (ref 0.0–1.2)
CO2: 25 mmol/L (ref 20–29)
Calcium: 9.5 mg/dL (ref 8.6–10.2)
Chloride: 103 mmol/L (ref 96–106)
Creatinine, Ser: 1.07 mg/dL (ref 0.76–1.27)
GFR calc Af Amer: 76 mL/min/{1.73_m2} (ref >59)
GFR calc non Af Amer: 66 mL/min/{1.73_m2} (ref >59)
Globulin, Total: 3 g/dL (ref 1.5–4.5)
Glucose: 93 mg/dL (ref 65–99)
Potassium: 4.3 mmol/L (ref 3.5–5.2)
Sodium: 141 mmol/L (ref 134–144)
Total Protein: 7.3 g/dL (ref 6.0–8.5)

## 2020-06-14 LAB — LIPID PANEL WITH LDL/HDL RATIO
Cholesterol, Total: 89 mg/dL — ABNORMAL LOW (ref 100–199)
HDL: 27 mg/dL — ABNORMAL LOW (ref >39)
LDL Chol Calc (NIH): 45 mg/dL (ref 0–99)
LDL/HDL Ratio: 1.7 ratio (ref 0.0–3.6)
Triglycerides: 84 mg/dL (ref 0–149)
VLDL Cholesterol Cal: 17 mg/dL (ref 5–40)

## 2020-06-14 LAB — PSA: Prostate Specific Ag, Serum: 2.5 ng/mL (ref 0.0–4.0)

## 2020-06-14 LAB — VITAMIN B12: Vitamin B-12: 344 pg/mL (ref 232–1245)

## 2020-06-14 LAB — TSH: TSH: 0.989 u[IU]/mL (ref 0.450–4.500)

## 2020-06-16 ENCOUNTER — Telehealth: Payer: Self-pay

## 2020-06-16 NOTE — Telephone Encounter (Signed)
Patient advised of lab results through mychart and has read the providers comments.  °

## 2020-06-16 NOTE — Telephone Encounter (Signed)
-----   Message from Jerrol Banana., MD sent at 06/14/2020  4:07 PM EDT ----- Labs all stable.

## 2020-06-19 ENCOUNTER — Encounter (HOSPITAL_COMMUNITY): Payer: Medicare Other

## 2020-06-20 ENCOUNTER — Other Ambulatory Visit (HOSPITAL_COMMUNITY): Payer: Self-pay | Admitting: Internal Medicine

## 2020-06-23 ENCOUNTER — Other Ambulatory Visit (HOSPITAL_COMMUNITY): Payer: Self-pay | Admitting: Internal Medicine

## 2020-06-23 DIAGNOSIS — I2510 Atherosclerotic heart disease of native coronary artery without angina pectoris: Secondary | ICD-10-CM

## 2020-06-28 ENCOUNTER — Other Ambulatory Visit (HOSPITAL_COMMUNITY): Payer: Medicare Other

## 2020-06-28 ENCOUNTER — Encounter (HOSPITAL_COMMUNITY): Payer: Medicare Other

## 2020-07-03 DIAGNOSIS — H6123 Impacted cerumen, bilateral: Secondary | ICD-10-CM | POA: Diagnosis not present

## 2020-07-03 DIAGNOSIS — H903 Sensorineural hearing loss, bilateral: Secondary | ICD-10-CM | POA: Diagnosis not present

## 2020-07-11 ENCOUNTER — Other Ambulatory Visit: Payer: Self-pay

## 2020-07-11 ENCOUNTER — Ambulatory Visit (HOSPITAL_COMMUNITY)
Admission: RE | Admit: 2020-07-11 | Discharge: 2020-07-11 | Disposition: A | Discharge status: Home / Self Care | Payer: Medicare Other | Source: Ambulatory Visit | Attending: Family Medicine | Admitting: Family Medicine

## 2020-07-11 DIAGNOSIS — I5030 Unspecified diastolic (congestive) heart failure: Secondary | ICD-10-CM | POA: Insufficient documentation

## 2020-07-11 DIAGNOSIS — Z136 Encounter for screening for cardiovascular disorders: Secondary | ICD-10-CM | POA: Insufficient documentation

## 2020-07-11 DIAGNOSIS — I110 Hypertensive heart disease with heart failure: Secondary | ICD-10-CM | POA: Diagnosis not present

## 2020-07-11 DIAGNOSIS — Z951 Presence of aortocoronary bypass graft: Secondary | ICD-10-CM | POA: Diagnosis not present

## 2020-07-11 DIAGNOSIS — I5032 Chronic diastolic (congestive) heart failure: Secondary | ICD-10-CM | POA: Diagnosis not present

## 2020-07-11 DIAGNOSIS — I2510 Atherosclerotic heart disease of native coronary artery without angina pectoris: Secondary | ICD-10-CM | POA: Diagnosis not present

## 2020-07-11 DIAGNOSIS — I77810 Thoracic aortic ectasia: Secondary | ICD-10-CM | POA: Diagnosis not present

## 2020-07-11 DIAGNOSIS — E785 Hyperlipidemia, unspecified: Secondary | ICD-10-CM | POA: Diagnosis not present

## 2020-07-11 DIAGNOSIS — G4730 Sleep apnea, unspecified: Secondary | ICD-10-CM | POA: Insufficient documentation

## 2020-07-11 LAB — ECHOCARDIOGRAM COMPLETE
AR max vel: 3.31 cm2
AV Area VTI: 3.91 cm2
AV Area mean vel: 4.19 cm2
AV Mean grad: 3 mmHg
AV Peak grad: 8.4 mmHg
Ao pk vel: 1.45 m/s
Area-P 1/2: 2.87 cm2
Calc EF: 63.8 %
S' Lateral: 3.2 cm
Single Plane A2C EF: 65.9 %
Single Plane A4C EF: 61.2 %

## 2020-07-11 NOTE — Progress Notes (Signed)
  Echocardiogram 2D Echocardiogram has been performed.  Kenric, Ginger 07/11/2020, 11:04 AM

## 2020-07-13 DIAGNOSIS — Z20822 Contact with and (suspected) exposure to covid-19: Secondary | ICD-10-CM | POA: Diagnosis not present

## 2020-07-23 IMAGING — CT CT ABDOMEN W/O CM
2 of 4 series · 16 of 46 positions shown, 18 images · non-contrast
Comparison: December 22, 2018

CLINICAL DATA: Adrenal mass

EXAM:
CT ABDOMEN WITHOUT CONTRAST
TECHNIQUE: Multidetector CT imaging of the abdomen was performed following the
standard protocol without IV contrast.

[Series 2: axial st adrenals 2.00 · axial · 0.79mm/px · z∈[-1320,-1060]mm · 13 of 144 slices shown, 15 images]
[im 7/144  soft-tissue]
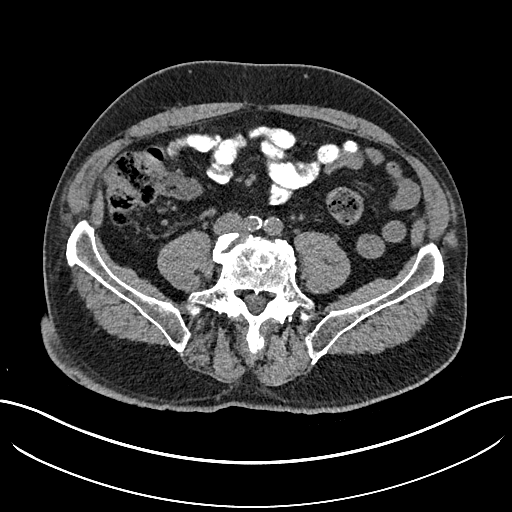
[im 7/144  bone]
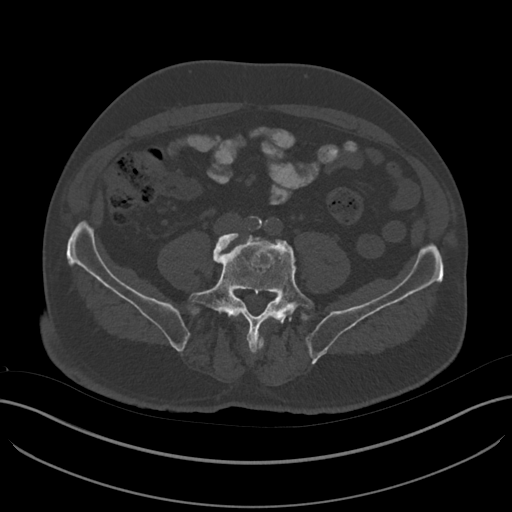
[im 21/144  soft-tissue]
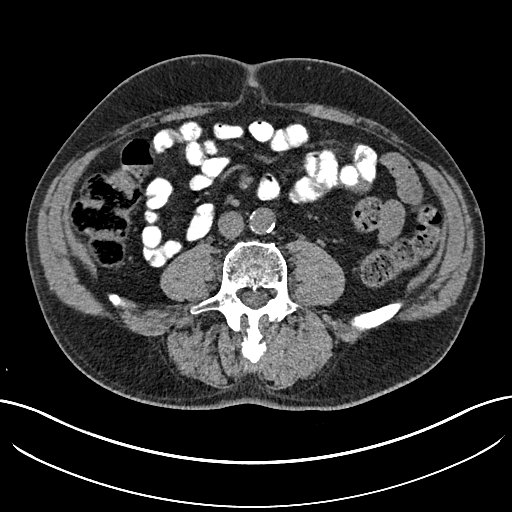
[im 28/144  soft-tissue]
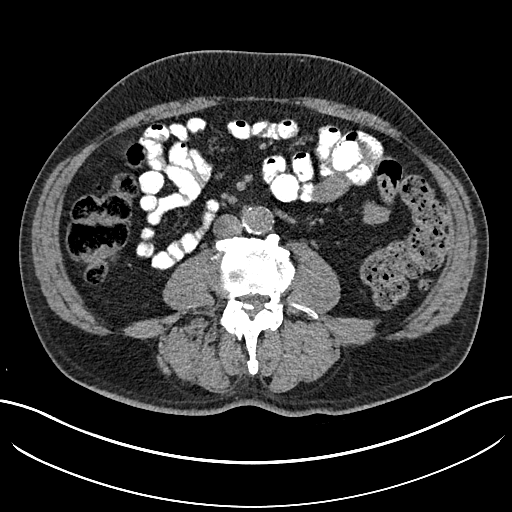
[im 41/144  soft-tissue]
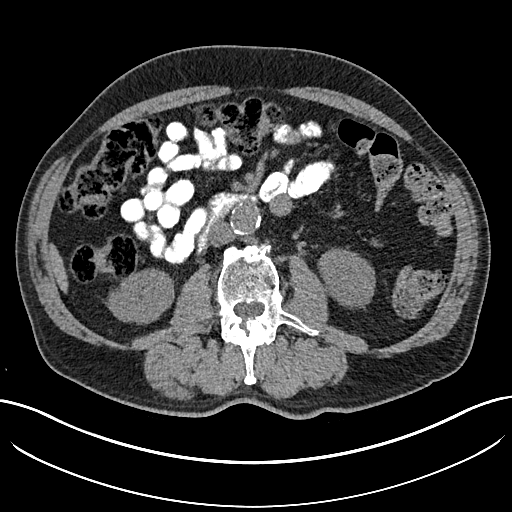
[im 48/144  soft-tissue]
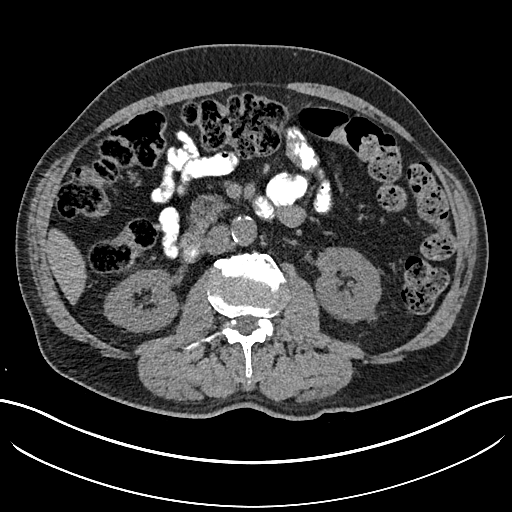
[im 62/144  soft-tissue]
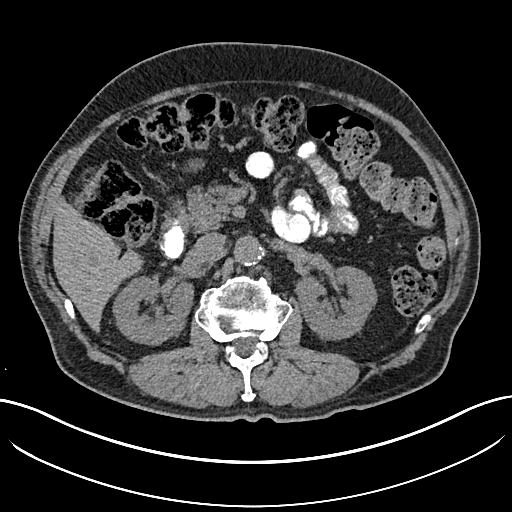
[im 75/144  soft-tissue]
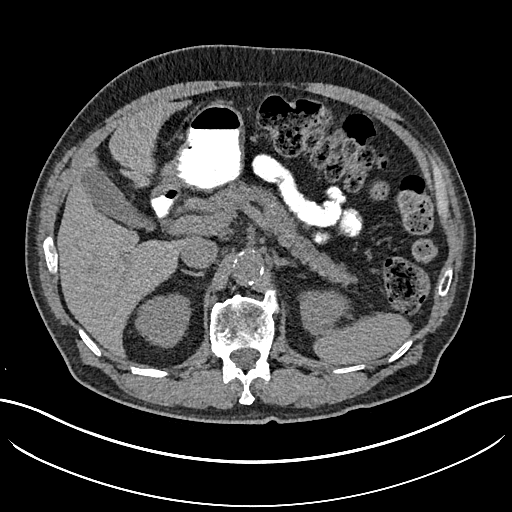
[im 82/144  soft-tissue]
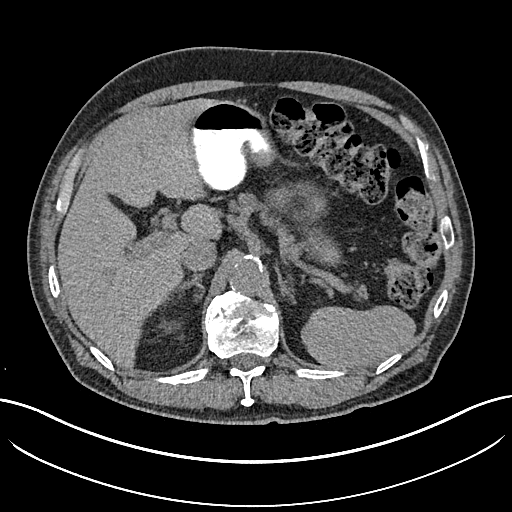
[im 96/144  soft-tissue]
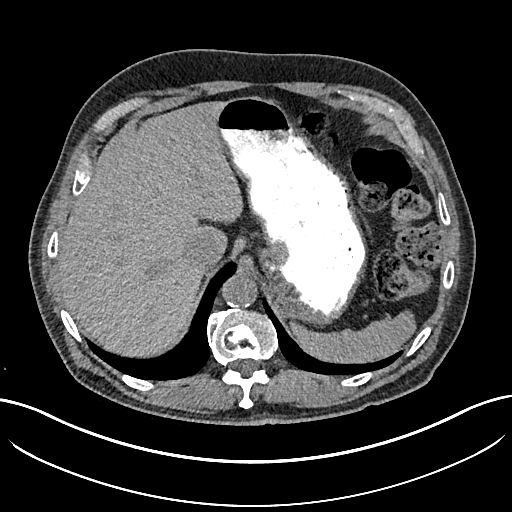
[im 96/144  bone]
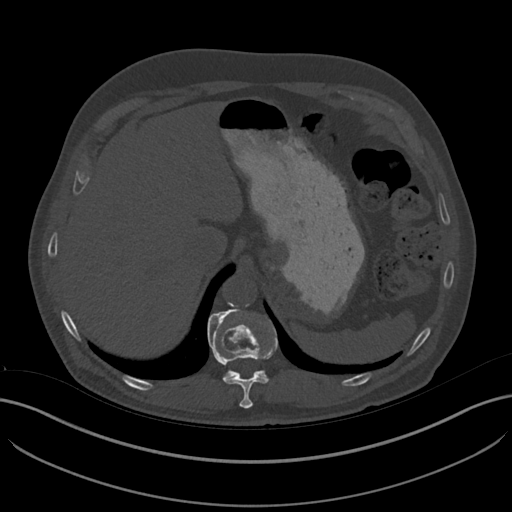
[im 103/144  soft-tissue]
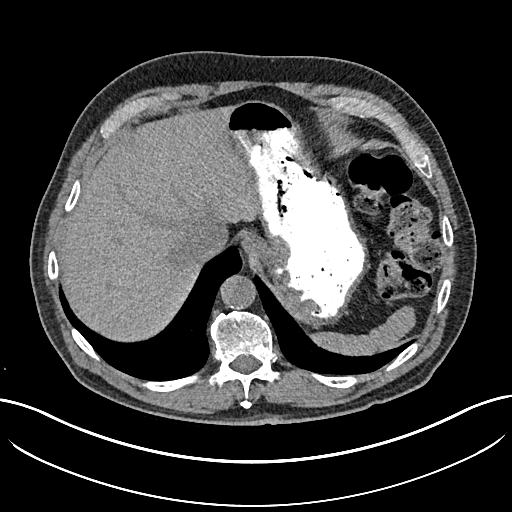
[im 116/144  soft-tissue]
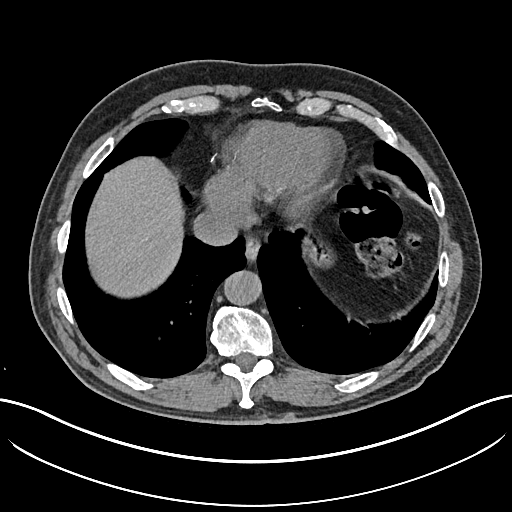
[im 123/144  soft-tissue]
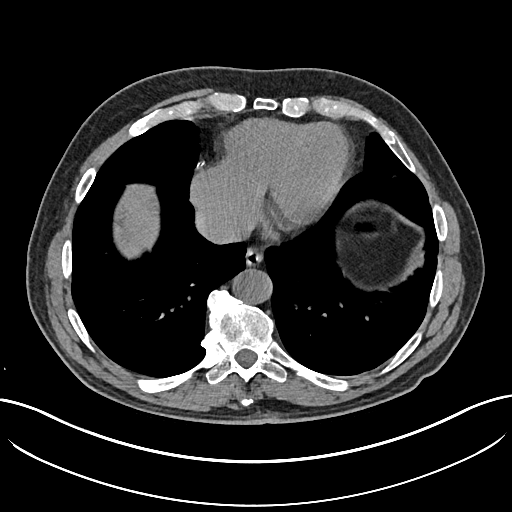
[im 137/144  soft-tissue]
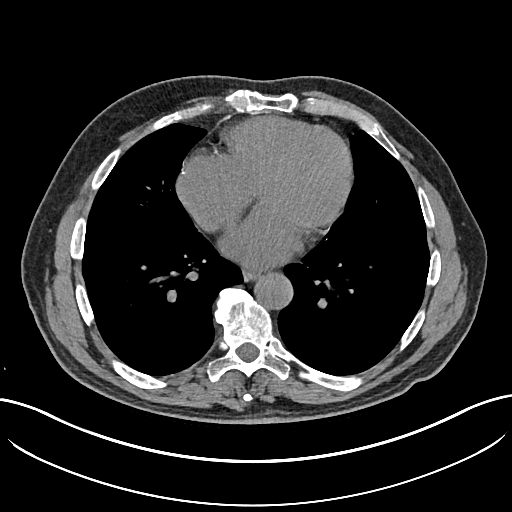

[Series 5: cor st adrenals 2.00 cor · coronal · 0.56mm/px · 3 of 160 slices shown]
[im 54/160  soft-tissue]
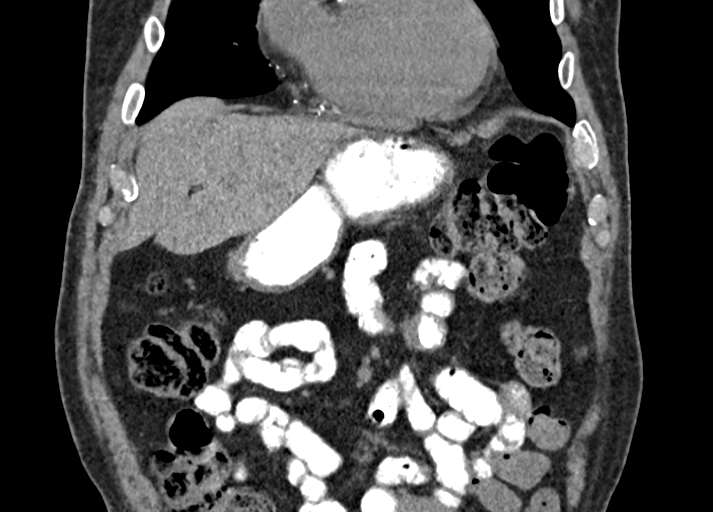
[im 71/160  soft-tissue]
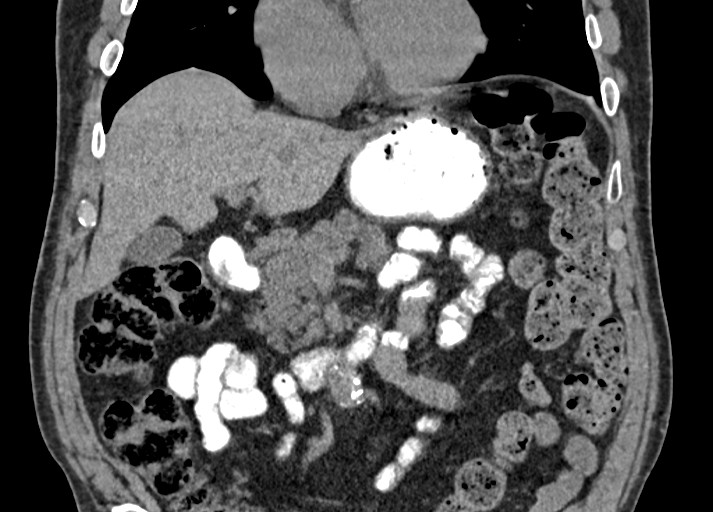
[im 89/160  soft-tissue]
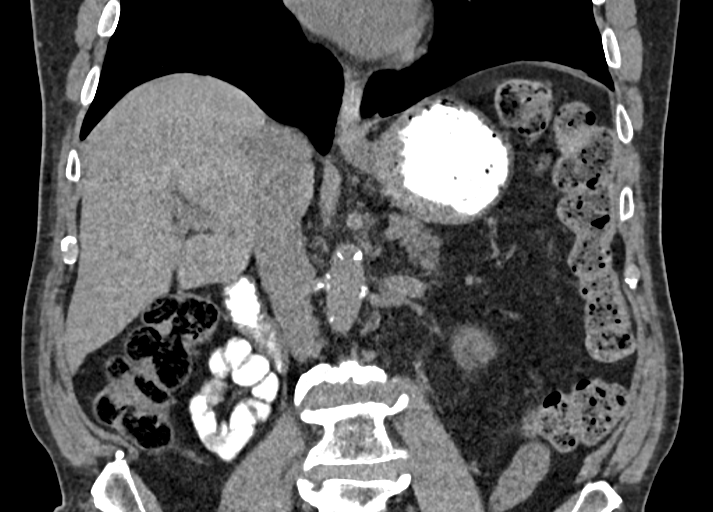

[16 of 46 positions shown; findings below may reference images not displayed]

FINDINGS: Lower chest: No acute abnormality.

Hepatobiliary: No focal liver abnormality is seen. No gallstones,
gallbladder wall thickening, or biliary dilatation.

Pancreas: Unremarkable. No pancreatic ductal dilatation or
surrounding inflammatory changes.

Spleen: Normal in size without focal abnormality.

Adrenals/Urinary Tract: There is a stable benign lipid rich right
adrenal adenoma measuring approximately 2 cm. The left adrenal gland
is unremarkable. The kidneys of are unremarkable without evidence
for hydronephrosis or radiopaque stone.

Stomach/Bowel: Stomach is within normal limits. Appendix appears
normal. No evidence of bowel wall thickening, distention, or
inflammatory changes.

Vascular/Lymphatic: Again noted are atherosclerotic changes
throughout the abdominal aorta with an infrarenal abdominal aortic
aneurysm measuring approximately 3.1 cm.

Other: No abdominal wall hernia or abnormality.

Musculoskeletal: No acute or significant osseous findings.
IMPRESSION: 1. Stable benign lipid rich right adrenal adenoma. No further
follow-up is required for this finding.
2. Stable infrarenal abdominal aortic aneurysm measuring
approximately 3.1 cm.
3.  Aortic Atherosclerosis ([id number]).

## 2020-08-14 ENCOUNTER — Telehealth: Payer: Self-pay

## 2020-08-14 NOTE — Telephone Encounter (Signed)
Copied from [place] [id number]. Topic: Appointment Scheduling - Scheduling Inquiry for Clinic >> Aug 14, 2020  2:08 PM Rainey Pines A wrote: Patient is requesting a call once high dose flu shot is available. Please advise

## 2020-08-15 NOTE — Telephone Encounter (Signed)
Patient scheduled for flu vaccine

## 2020-08-16 ENCOUNTER — Ambulatory Visit (INDEPENDENT_AMBULATORY_CARE_PROVIDER_SITE_OTHER): Payer: Medicare Other

## 2020-08-16 ENCOUNTER — Other Ambulatory Visit: Payer: Self-pay

## 2020-08-16 DIAGNOSIS — Z23 Encounter for immunization: Secondary | ICD-10-CM

## 2020-08-19 ENCOUNTER — Other Ambulatory Visit (HOSPITAL_COMMUNITY): Payer: Self-pay | Admitting: Internal Medicine

## 2020-09-19 ENCOUNTER — Other Ambulatory Visit: Payer: Self-pay | Admitting: Family Medicine

## 2020-09-23 ENCOUNTER — Other Ambulatory Visit (HOSPITAL_COMMUNITY): Payer: Self-pay | Admitting: Internal Medicine

## 2020-09-23 DIAGNOSIS — I2510 Atherosclerotic heart disease of native coronary artery without angina pectoris: Secondary | ICD-10-CM

## 2020-09-29 ENCOUNTER — Other Ambulatory Visit: Payer: Self-pay | Admitting: Otolaryngology

## 2020-09-29 DIAGNOSIS — H903 Sensorineural hearing loss, bilateral: Secondary | ICD-10-CM | POA: Diagnosis not present

## 2020-09-29 DIAGNOSIS — R42 Dizziness and giddiness: Secondary | ICD-10-CM | POA: Diagnosis not present

## 2020-09-29 DIAGNOSIS — I6509 Occlusion and stenosis of unspecified vertebral artery: Secondary | ICD-10-CM

## 2020-10-03 NOTE — Progress Notes (Signed)
Subjective:   Micheal Wright is a 79 y.o. male who presents for Medicare Annual/Subsequent preventive examination.  I connected with Micheal Wright today by telephone and verified that I am speaking with the correct person using two identifiers. Location patient: home Location provider: work Persons participating in the virtual visit: patient, provider.   I discussed the limitations, risks, security and privacy concerns of performing an evaluation and management service by telephone and the availability of in person appointments. I also discussed with the patient that there may be a patient responsible charge related to this service. The patient expressed understanding and verbally consented to this telephonic visit.    Interactive audio and video telecommunications were attempted between this provider and patient, however failed, due to patient having technical difficulties OR patient did not have access to video capability.  We continued and completed visit with audio only.   Review of Systems    N/A  Cardiac Risk Factors include: advanced age (>64men, >24 women);dyslipidemia;male gender;hypertension     Objective:    There were no vitals filed for this visit. There is no height or weight on file to calculate BMI.  Advanced Directives 10/04/2020 09/30/2019 04/29/2018 04/14/2018 03/19/2018 04/03/2016 11/14/2015  Does Patient Have a Medical Advance Directive? Yes Yes No No No No No  Type of Paramedic of [place];Living will [place];Living will - - - - -  Copy of [place] in Chart? No - copy requested No - copy requested - - - - -  Would patient like information on creating a medical advance directive? - - No - Patient declined No - Patient declined No - Patient declined - No - patient declined information    Current Medications (verified) Outpatient Encounter Medications as of 10/04/2020  Medication Sig  . amLODipine (NORVASC) 5  MG tablet TAKE 1 TABLET BY MOUTH ONCE A DAY  . atorvastatin (LIPITOR) 80 MG tablet TAKE 1 TABLET BY MOUTH DAILY  . ELIQUIS 5 MG TABS tablet TAKE 1 TABLET BY MOUTH TWICE A DAY  . hydrochlorothiazide (MICROZIDE) 12.5 MG capsule Take 1 capsule (12.5 mg total) by mouth daily.  Marland Kitchen losartan (COZAAR) 50 MG tablet TAKE 1 TABLET BY MOUTH ONCE A DAY  . NITROSTAT 0.4 MG SL tablet DISSOLVE 1 TABLET UNDER THE TONGUE FOR CHEST PAIN. MAY REPEAT EVERY 5MINUTES UP TO 3 DOSES. IF NO RELIEF, CALL 911**  . omeprazole (PRILOSEC) 40 MG capsule TAKE 1 CAPSULE BY MOUTH ONCE DAILY   No facility-administered encounter medications on file as of 10/04/2020.    Allergies (verified) Patient has no known allergies.   History: Past Medical History:  Diagnosis Date  . APC (atrial premature contractions) 05/04/2014  . 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 Atherosclerosis of coronary artery 11/08/2008   All risk factors treated.   . Atrial complex, premature 05/04/2014  . CAD (coronary artery disease)    s/p CABG 1998; Last cath 2002 in setting of marked ST elevation during stress testing. patent grafts; Echo 8/09 EF 55%. No significant valvular disease.  . Cardiac murmur 09/03/2011   Overview:  Overview:  Last Assessment & Plan:  Likely mild aortic valve sclerosis. Will check echo.    . Carotid artery disease ([place])    u/s 9/10: R 40-59% L 0-39%  . Carotid artery obstruction 08/22/2009   Overview:  Overview:  Qualifier: Diagnosis of  By: Haroldine Laws, MD, Eileen Stanford  Last Assessment & Plan:  Asymptomatic. Due for  repeat u/s.  Overview:  Overview:  Qualifier: Diagnosis of  By: Haroldine Laws, MD, Eileen Stanford  Last Assessment & Plan:  Asymptomatic. Due for repeat u/s.    . Carotid stenosis 08/22/2009   Qualifier: Diagnosis of  By: Haroldine Laws, MD, Eileen Stanford Cerebrovascular disease 11/17/2008  . Chest tightness 01/20/2015  . CHF (congestive heart failure) ([place])   . Chronic atrial fibrillation ([place]) 03/02/2018  .  Coronary artery disease 05/11/2007   Qualifier: Diagnosis of  By: Lurlean Nanny LPN, Reg[place]    . Erectile dysfunction   . Essential hypertension 05/11/2007   Overview:  Overview:  Overview:  Qualifier: Diagnosis of  By: Lurlean Nanny LPN, Regina   Last Assessment & Plan:  BP up slightly here but when he checks at [employer] usually 120/65. Will continue current regimen.  Qualifier: Diagnosis of  By: Lurlean Nanny LPN, Regina     . Family history of breast cancer   . Family history of colon cancer   . Family history of colonic polyps   . GERD (gastroesophageal reflux disease)   . History of colonic polyps   . Hypercholesteremia 11/08/2008  . Hyperlipidemia   . Hypertension   . Murmur, cardiac 09/03/2011  . PAC (premature atrial contraction) 05/04/2014  . 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 Sleep apnea    Past Surgical History:  Procedure Laterality Date  . ANGIOPLASTY     x 2  . APPENDECTOMY  1960's  . CARDIAC CATHETERIZATION  2002  . CARDIOVERSION N/A 11/14/2015   Procedure: CARDIOVERSION;  Surgeon: Jolaine Artist, MD;  Location: [facility] ENDOSCOPY;  Service: Cardiovascular;  Laterality: N/A;  . COLONOSCOPY W/ POLYPECTOMY    . COLONOSCOPY WITH PROPOFOL N/A 11/13/2015   Procedure: COLONOSCOPY WITH PROPOFOL;  Surgeon: Manya Silvas, MD;  Location: [facility] ENDOSCOPY;  Service: Endoscopy;  Laterality: N/A;  . COLONOSCOPY WITH PROPOFOL N/A 02/01/2019   Procedure: COLONOSCOPY WITH PROPOFOL;  Surgeon: Manya Silvas, MD;  Location: [facility] ENDOSCOPY;  Service: Endoscopy;  Laterality: N/A;  . CORONARY ARTERY BYPASS GRAFT  1998  . ESOPHAGOGASTRODUODENOSCOPY  04/2004   Inflam. at Z line (biopsy neg)  . ESOPHAGOGASTRODUODENOSCOPY N/A 02/01/2019   Procedure: ESOPHAGOGASTRODUODENOSCOPY (EGD);  Surgeon: Manya Silvas, MD;  Location: [facility] ENDOSCOPY;  Service: Endoscopy;  Laterality: N/A;  . HERPES SIMPLEX VIRUS DFA N/A   . KNEE ARTHROSCOPY W/ PARTIAL MEDIAL MENISCECTOMY  1990s   Left knee  . MRI    . sleep study     . UVULOPALATOPHARYNGOPLASTY     Family History  Problem Relation Age of Onset  . Stroke Mother   . Heart attack Father   . Alcohol abuse Father   . Breast cancer Sister        25  . Heart disease Sister   . Rheumatologic disease Brother        Heart  . Heart disease Brother   . Breast cancer Maternal Aunt        dx 30s d. 91s  . Colon cancer Paternal Uncle        dx 8s  . Colon polyps Son        5-6  . Hypertension Neg Hx   . Diabetes Neg Hx   . Prostate cancer Neg Hx    Social History   Socioeconomic History  . Marital status: Married    Spouse name: Not on file  . Number of children: 3  . Years of education: Not on file  . Highest  education level: 12th grade  Occupational History  . Occupation: Press photographer, Academic librarian parts - Retired  Tobacco Use  . Smoking status: Former Research scientist (life sciences)  . Smokeless tobacco: Never Used  . Tobacco comment: quit around age [age], did not inhale  Vaping Use  . Vaping Use: Never used  Substance and Sexual Activity  . Alcohol use: No  . Drug use: No  . Sexual activity: Never  Other Topics Concern  . Not on file  Social History Narrative   Married with 3 children   Social Determinants of Health   Financial Resource Strain: Low Risk   . Difficulty of Paying Living Expenses: Not hard at all  Food Insecurity: No Food Insecurity  . Worried About Charity fundraiser in the Last Year: Never true  . Ran Out of Food in the Last Year: Never true  Transportation Needs: No Transportation Needs  . Lack of Transportation (Medical): No  . Lack of Transportation (Non-Medical): No  Physical Activity: Inactive  . Days of Exercise per Week: 0 days  . Minutes of Exercise per Session: 0 min  Stress: No Stress Concern Present  . Feeling of Stress : Only a little  Social Connections: Moderately Integrated  . Frequency of Communication with Friends and Family: More than three times a week  . Frequency of Social Gatherings with Friends and Family: More than  three times a week  . Attends Religious Services: More than 4 times per year  . Active Member of Clubs or Organizations: No  . Attends Archivist Meetings: Never  . Marital Status: Married    Tobacco Counseling Counseling given: Not Answered Comment: quit around age [age], did not inhale   Clinical Intake:  Pre-visit preparation completed: Yes  Pain : No/denies pain     Nutritional Risks: None Diabetes: No  How often do you need to have someone help you when you read instructions, pamphlets, or other written materials from your doctor or pharmacy?: 1 - Never  Diabetic? No  Interpreter Needed?: No  Information entered by :: [facility], LPN   Activities of Daily Living In your present state of health, do you have any difficulty performing the following activities: 10/04/2020 06/12/2020  Hearing? Tempie Donning  Comment Wears bilateral hearing aids. -  Vision? 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 Pt plans to have these removed this year. -  Difficulty concentrating or making decisions? N N  Walking or climbing stairs? N N  Dressing or bathing? N N  Doing errands, shopping? N N  Preparing Food and eating ? N -  Using the Toilet? N -  In the past six months, have you accidently leaked urine? N -  Do you have problems with loss of bowel control? N -  Managing your Medications? N -  Managing your Finances? N -  Housekeeping or managing your Housekeeping? N -  Some recent data might be hidden    Patient Care Team: Jerrol Banana., MD as PCP - General (Unknown Physician Specialty) Haroldine Laws, Shaune Pascal, MD as Consulting Physician (Cardiology) Carloyn Manner, MD as Referring Physician (Otolaryngology) Rodney Booze as Physician Assistant Oneta Rack, MD (Dermatology)  Indicate any recent Medical Services you may have received from other than [facility] providers in the past year (date may be approximate).     Assessment:   This is a routine  wellness examination for [place].  Hearing/Vision screen No exam data present  Dietary issues and exercise activities  discussed: Current Exercise Habits: The patient does not participate in regular exercise at present, Exercise limited by: None identified  Goals    . DIET - REDUCE SALT INTAKE TO 2 GRAMS PER DAY OR LESS     Recommend decreasing amount of salt intake to 2 grams or less a day.     . Prevent falls     Recommend to remove any items from the home that may cause slips or trips.      Depression Screen PHQ 2/9 Scores 10/04/2020 06/12/2020 09/30/2019 05/04/2019 05/04/2019 04/29/2018 04/23/2017  PHQ - 2 Score 0 0 0 0 0 0 0  PHQ- 9 Score - 0 - 0 - - -    Fall Risk Fall Risk  10/04/2020 06/12/2020 09/30/2019 09/30/2019 04/29/2018  Falls in the past year? 1 0 0 0 No  Comment Due to foot slipping on ladder. - - - -  Number falls in past yr: 0 0 0 0 -  Injury with Fall? 0 0 0 0 -  Risk for fall due to : Impaired balance/gait No Fall Risks - - -  Follow up Falls prevention discussed Follow up appointment - - -    Any stairs in or around the home? Yes  If so, are there any without handrails? No  Home free of loose throw rugs in walkways, pet beds, electrical cords, etc? Yes  Adequate lighting in your home to reduce risk of falls? Yes   ASSISTIVE DEVICES UTILIZED TO PREVENT FALLS:  Life alert? No  Use of a cane, walker or w/c? No  Grab bars in the bathroom? No  Shower chair or bench in shower? No  Elevated toilet seat or a handicapped toilet? No    Cognitive Function:      6CIT Screen 10/04/2020 09/30/2019 04/29/2018  What Year? 0 points 0 points 0 points  What month? 0 points 0 points 0 points  What time? 0 points 0 points 0 points  Count back from 20 0 points 0 points 0 points  Months in reverse 0 points 0 points 0 points  Repeat phrase 0 points 0 points 0 points  Total Score 0 0 0    Immunizations Immunization History  Administered Date(s) Administered  . Fluad  Quad(high Dose 65+) 08/16/2020  . Influenza Split 08/23/2008, 09/14/2009, 09/24/2010, 08/19/2011, 08/17/2012  . Influenza Whole 10/02/2005  . Influenza, High Dose Seasonal PF 09/27/2014, 09/13/2015, 08/23/2016, 08/28/2017, 08/10/2018, 08/04/2019  . Influenza,inj,Quad PF,6+ Mos 08/07/2013  . PFIZER SARS-COV-2 Vaccination 01/06/2020, 01/27/2020  . Pneumococcal Conjugate-13 04/17/2015  . Pneumococcal Polysaccharide-23 08/23/2008  . Td 10/02/2002  . Tdap 04/17/2015  . Zoster Recombinat (Shingrix) 09/28/2018, 12/15/2018    TDAP status: Up to date Flu Vaccine status: Up to date Pneumococcal vaccine status: Up to date Covid-19 vaccine status: Completed vaccines  Qualifies for Shingles Vaccine? Yes   Zostavax completed No   Shingrix Completed?: Yes  Screening Tests Health Maintenance  Topic Date Due  . TETANUS/TDAP  04/16/2025  . INFLUENZA VACCINE  Completed  . COVID-19 Vaccine  Completed  . Hepatitis C Screening  Completed  . PNA vac Low Risk Adult  Completed    Health Maintenance  There are no preventive care reminders to display for this patient.  Colorectal cancer screening: No longer required.   Lung Cancer Screening: (Low Dose CT Chest recommended if Age 83-80 years, 30 pack-year currently smoking OR have quit w/in 15years.) does not qualify.   Additional Screening:  Hepatitis C Screening: Up to date  Vision Screening: Recommended annual ophthalmology exams for early detection of glaucoma and other disorders of the eye. Is the patient up to date with their annual eye exam?  Yes  Who is the provider or what is the name of the office in which the patient attends annual eye exams? Pt is currently in the process of switching eye doctors. If pt is not established with a provider, would they like to be referred to a provider to establish care? No .   Dental Screening: Recommended annual dental exams for proper oral hygiene  Community Resource Referral / Chronic Care  Management: CRR required this visit?  No   CCM required this visit?  No      Plan:     I have personally reviewed and noted the following in the patient's chart:   . Medical and social history . Use of alcohol, tobacco or illicit drugs  . Current medications and supplements . Functional ability and status . Nutritional status . Physical activity . Advanced directives . List of other physicians . Hospitalizations, surgeries, and ER visits in previous 12 months . Vitals . Screenings to include cognitive, depression, and falls . Referrals and appointments  In addition, I have reviewed and discussed with patient certain preventive protocols, quality metrics, and best practice recommendations. A written personalized care plan for preventive services as well as general preventive health recommendations were provided to patient.     Decklan Mau [place], [place]   85/08/2923   Nurse Notes: None.

## 2020-10-04 ENCOUNTER — Other Ambulatory Visit: Payer: Self-pay

## 2020-10-04 ENCOUNTER — Ambulatory Visit (INDEPENDENT_AMBULATORY_CARE_PROVIDER_SITE_OTHER): Payer: Medicare Other

## 2020-10-04 DIAGNOSIS — Z0000 Encounter for general adult medical examination without abnormal findings: Secondary | ICD-10-CM

## 2020-10-04 NOTE — Patient Instructions (Signed)
Micheal Wright , Thank you for taking time to come for your Medicare Wellness Visit. I appreciate your ongoing commitment to your health goals. Please review the following plan we discussed and let me know if I can assist you in the future.   Screening recommendations/referrals: Colonoscopy: No longer required.  Recommended yearly ophthalmology/optometry visit for glaucoma screening and checkup Recommended yearly dental visit for hygiene and checkup  Vaccinations: Influenza vaccine: Done 08/16/20 Pneumococcal vaccine: Completed series Tdap vaccine: Up to date, due 04/2025 Shingles vaccine: Completed series    Advanced directives: Please bring a copy of your POA (Power of Attorney) and/or Living Will to your next appointment.   Conditions/risks identified: Fall risk preventatives discussed today. Recommend decreasing amount of salt intake to 2 grams or less a day.   Next appointment: 10/16/20 @ 9:20 AM with Dr Rosanna Randy   Preventive Care 60 Years and Older, Male Preventive care refers to lifestyle choices and visits with your health care provider that can promote health and wellness. What does preventive care include?  A yearly physical exam. This is also called an annual well check.  Dental exams once or twice a year.  Routine eye exams. Ask your health care provider how often you should have your eyes checked.  Personal lifestyle choices, including:  Daily care of your teeth and gums.  Regular physical activity.  Eating a healthy diet.  Avoiding tobacco and drug use.  Limiting alcohol use.  Practicing safe sex.  Taking low doses of aspirin every day.  Taking vitamin and mineral supplements as recommended by your health care provider. What happens during an annual well check? The services and screenings done by your health care provider during your annual well check will depend on your age, overall health, lifestyle risk factors, and family history of disease. Counseling  Your  health care provider may ask you questions about your:  Alcohol use.  Tobacco use.  Drug use.  Emotional well-being.  Home and relationship well-being.  Sexual activity.  Eating habits.  History of falls.  Memory and ability to understand (cognition).  Work and work Statistician. Screening  You may have the following tests or measurements:  Height, weight, and BMI.  Blood pressure.  Lipid and cholesterol levels. These may be checked every 5 years, or more frequently if you are over 60 years old.  Skin check.  Lung cancer screening. You may have this screening every year starting at age 77 if you have a 30-pack-year history of smoking and currently smoke or have quit within the past 15 years.  Fecal occult blood test (FOBT) of the stool. You may have this test every year starting at age 13.  Flexible sigmoidoscopy or colonoscopy. You may have a sigmoidoscopy every 5 years or a colonoscopy every 10 years starting at age 59.  Prostate cancer screening. Recommendations will vary depending on your family history and other risks.  Hepatitis C blood test.  Hepatitis B blood test.  Sexually transmitted disease (STD) testing.  Diabetes screening. This is done by checking your blood sugar (glucose) after you have not eaten for a while (fasting). You may have this done every 1-3 years.  Abdominal aortic aneurysm (AAA) screening. You may need this if you are a current or former smoker.  Osteoporosis. You may be screened starting at age 3 if you are at high risk. Talk with your health care provider about your test results, treatment options, and if necessary, the need for more tests. Vaccines  Your health  care provider may recommend certain vaccines, such as:  Influenza vaccine. This is recommended every year.  Tetanus, diphtheria, and acellular pertussis (Tdap, Td) vaccine. You may need a Td booster every 10 years.  Zoster vaccine. You may need this after age  81.  Pneumococcal 13-valent conjugate (PCV13) vaccine. One dose is recommended after age 1.  Pneumococcal polysaccharide (PPSV23) vaccine. One dose is recommended after age 45. Talk to your health care provider about which screenings and vaccines you need and how often you need them. This information is not intended to replace advice given to you by your health care provider. Make sure you discuss any questions you have with your health care provider. Document Released: 12/15/2015 Document Revised: 08/07/2016 Document Reviewed: 09/19/2015 Elsevier Interactive Patient Education  2017 [place] Prevention in the Home Falls can cause injuries. They can happen to people of all ages. There are many things you can do to make your home safe and to help prevent falls. What can I do on the outside of my home?  Regularly fix the edges of walkways and driveways and fix any cracks.  Remove anything that might make you trip as you walk through a door, such as a raised step or threshold.  Trim any bushes or trees on the path to your home.  Use bright outdoor lighting.  Clear any walking paths of anything that might make someone trip, such as rocks or tools.  Regularly check to see if handrails are loose or broken. Make sure that both sides of any steps have handrails.  Any raised decks and porches should have guardrails on the edges.  Have any leaves, snow, or ice cleared regularly.  Use sand or salt on walking paths during winter.  Clean up any spills in your garage right away. This includes oil or grease spills. What can I do in the bathroom?  Use night lights.  Install grab bars by the toilet and in the tub and shower. Do not use towel bars as grab bars.  Use non-skid mats or decals in the tub or shower.  If you need to sit down in the shower, use a plastic, non-slip stool.  Keep the floor dry. Clean up any water that spills on the floor as soon as it happens.  Remove  soap buildup in the tub or shower regularly.  Attach bath mats securely with double-sided non-slip rug tape.  Do not have throw rugs and other things on the floor that can make you trip. What can I do in the bedroom?  Use night lights.  Make sure that you have a light by your bed that is easy to reach.  Do not use any sheets or blankets that are too big for your bed. They should not hang down onto the floor.  Have a firm chair that has side arms. You can use this for support while you get dressed.  Do not have throw rugs and other things on the floor that can make you trip. What can I do in the kitchen?  Clean up any spills right away.  Avoid walking on wet floors.  Keep items that you use a lot in easy-to-reach places.  If you need to reach something above you, use a strong step stool that has a grab bar.  Keep electrical cords out of the way.  Do not use floor polish or wax that makes floors slippery. If you must use wax, use non-skid floor wax.  Do not have  throw rugs and other things on the floor that can make you trip. What can I do with my stairs?  Do not leave any items on the stairs.  Make sure that there are handrails on both sides of the stairs and use them. Fix handrails that are broken or loose. Make sure that handrails are as long as the stairways.  Check any carpeting to make sure that it is firmly attached to the stairs. Fix any carpet that is loose or worn.  Avoid having throw rugs at the top or bottom of the stairs. If you do have throw rugs, attach them to the floor with carpet tape.  Make sure that you have a light switch at the top of the stairs and the bottom of the stairs. If you do not have them, ask someone to add them for you. What else can I do to help prevent falls?  Wear shoes that:  Do not have high heels.  Have rubber bottoms.  Are comfortable and fit you well.  Are closed at the toe. Do not wear sandals.  If you use a  stepladder:  Make sure that it is fully opened. Do not climb a closed stepladder.  Make sure that both sides of the stepladder are locked into place.  Ask someone to hold it for you, if possible.  Clearly mark and make sure that you can see:  Any grab bars or handrails.  First and last steps.  Where the edge of each step is.  Use tools that help you move around (mobility aids) if they are needed. These include:  Canes.  Walkers.  Scooters.  Crutches.  Turn on the lights when you go into a dark area. Replace any light bulbs as soon as they burn out.  Set up your furniture so you have a clear path. Avoid moving your furniture around.  If any of your floors are uneven, fix them.  If there are any pets around you, be aware of where they are.  Review your medicines with your doctor. Some medicines can make you feel dizzy. This can increase your chance of falling. Ask your doctor what other things that you can do to help prevent falls. This information is not intended to replace advice given to you by your health care provider. Make sure you discuss any questions you have with your health care provider. Document Released: 09/14/2009 Document Revised: 04/25/2016 Document Reviewed: 12/23/2014 Elsevier Interactive Patient Education  2017 [employer].

## 2020-10-05 ENCOUNTER — Other Ambulatory Visit: Payer: Self-pay

## 2020-10-05 ENCOUNTER — Ambulatory Visit
Admission: RE | Admit: 2020-10-05 | Discharge: 2020-10-05 | Disposition: A | Discharge status: Home / Self Care | Payer: Medicare Other | Source: Ambulatory Visit | Attending: Otolaryngology | Admitting: Otolaryngology

## 2020-10-05 DIAGNOSIS — I6509 Occlusion and stenosis of unspecified vertebral artery: Secondary | ICD-10-CM

## 2020-10-05 DIAGNOSIS — I6523 Occlusion and stenosis of bilateral carotid arteries: Secondary | ICD-10-CM | POA: Diagnosis not present

## 2020-10-06 DIAGNOSIS — D225 Melanocytic nevi of trunk: Secondary | ICD-10-CM | POA: Diagnosis not present

## 2020-10-06 DIAGNOSIS — L570 Actinic keratosis: Secondary | ICD-10-CM | POA: Diagnosis not present

## 2020-10-06 DIAGNOSIS — D2261 Melanocytic nevi of right upper limb, including shoulder: Secondary | ICD-10-CM | POA: Diagnosis not present

## 2020-10-06 DIAGNOSIS — Z85828 Personal history of other malignant neoplasm of skin: Secondary | ICD-10-CM | POA: Diagnosis not present

## 2020-10-06 DIAGNOSIS — D2272 Melanocytic nevi of left lower limb, including hip: Secondary | ICD-10-CM | POA: Diagnosis not present

## 2020-10-06 DIAGNOSIS — D2262 Melanocytic nevi of left upper limb, including shoulder: Secondary | ICD-10-CM | POA: Diagnosis not present

## 2020-10-06 DIAGNOSIS — X32XXXA Exposure to sunlight, initial encounter: Secondary | ICD-10-CM | POA: Diagnosis not present

## 2020-10-13 NOTE — Progress Notes (Signed)
I,[name],acting as a scribe for [name], MD.,have documented all relevant documentation on the behalf of [name], MD,as directed by  [name], MD while in the presence of [name], MD.   Established patient visit   Patient: Micheal Wright   DOB: 30-Nov-1941   79 y.o. Male  MRN: [id number] Visit Date: 10/16/2020  Today's healthcare provider: [name], MD   Chief Complaint  Patient presents with  . Follow-up  . Hypertension   Subjective    HPI  Patient comes in today for follow-up.  He feels fairly well but does have some lightheadedness when he first stands up.  No syncope and he has had no falls.  He does have some mild dizziness on occasion. He states he has cataracts and "eye problems". He recently had carotid Dopplers done which were negative. Recent echocardiogram was normal with a LVEF of 60 to 65% Hypertension, follow-up  BP Readings from Last 3 Encounters:  06/12/20 126/66  06/01/20 105/65  11/15/19 133/76   Wt Readings from Last 3 Encounters:  06/12/20 190 lb 12.8 oz (86.5 kg)  06/01/20 191 lb 3.2 oz (86.7 kg)  11/15/19 195 lb (88.5 kg)     He was last seen for hypertension 4 months ago.  BP at that visit was 126/66. Management since that visit includes; Good control. He reports good compliance with treatment. He is not having side effects. none He patient is not actively exercising, but he is active all day exercising. He is adherent to low salt diet.   Outside blood pressures are 135/75, 140/80.  He does not smoke.  Use of agents associated with hypertension: none.   --------------------------------------------------------------------  Anemia, unspecified type From 06/12/2020-Labs all stable.  Unspecified protein-calorie malnutrition ([place])  From 06/12/2020-Work on balanced diet.       Medications: Outpatient Medications Prior to Visit  Medication Sig  . amLODipine (NORVASC) 5 MG tablet  TAKE 1 TABLET BY MOUTH ONCE A DAY  . atorvastatin (LIPITOR) 80 MG tablet TAKE 1 TABLET BY MOUTH DAILY  . ELIQUIS 5 MG TABS tablet TAKE 1 TABLET BY MOUTH TWICE A DAY  . hydrochlorothiazide (MICROZIDE) 12.5 MG capsule Take 1 capsule (12.5 mg total) by mouth daily.  Marland Kitchen losartan (COZAAR) 50 MG tablet TAKE 1 TABLET BY MOUTH ONCE A DAY  . NITROSTAT 0.4 MG SL tablet DISSOLVE 1 TABLET UNDER THE TONGUE FOR CHEST PAIN. MAY REPEAT EVERY 5MINUTES UP TO 3 DOSES. IF NO RELIEF, CALL 911**  . omeprazole (PRILOSEC) 40 MG capsule TAKE 1 CAPSULE BY MOUTH ONCE DAILY   No facility-administered medications prior to visit.    Review of Systems  Constitutional: Negative for appetite change, chills and fever.  Respiratory: Negative for chest tightness, shortness of breath and wheezing.   Cardiovascular: Negative for chest pain and palpitations.  Gastrointestinal: Negative for abdominal pain, nausea and vomiting.       Objective    There were no vitals taken for this visit. BP Readings from Last 3 Encounters:  06/12/20 126/66  06/01/20 105/65  11/15/19 133/76   Wt Readings from Last 3 Encounters:  06/12/20 190 lb 12.8 oz (86.5 kg)  06/01/20 191 lb 3.2 oz (86.7 kg)  11/15/19 195 lb (88.5 kg)      Physical Exam Vitals reviewed.  Constitutional:      Appearance: He is well-developed.  HENT:     Head: Normocephalic and atraumatic.     Right Ear: External ear  normal.     Left Ear: External ear normal.     Nose: Nose normal.  Eyes:     Conjunctiva/sclera: Conjunctivae normal.  Neck:     Thyroid: No thyromegaly.  Cardiovascular:     Rate and Rhythm: Normal rate and regular rhythm.     Heart sounds: Normal heart sounds.  Pulmonary:     Effort: Pulmonary effort is normal.     Breath sounds: Normal breath sounds.  Abdominal:     General: Bowel sounds are normal.     Palpations: Abdomen is soft.  Musculoskeletal:     Cervical back: Neck supple.  Skin:    General: Skin is warm and dry.   Neurological:     General: No focal deficit present.     Mental Status: He is alert and oriented to person, place, and time.     Comments: Grossly nonfocal neurologic exam  Psychiatric:        Mood and Affect: Mood normal.        Behavior: Behavior normal.        Thought Content: Thought content normal.        Judgment: Judgment normal.   ECG reveals atrial fibrillation with incomplete right bundle branch block.  Heart rate about 65.   No results found for any visits on 10/16/20.  Assessment & Plan     1. Essential hypertension Blood pressure well controlled.  I think he may be having a little orthostasis causing some of the symptoms along with atrial fibrillation.  Stay hydrated.  No changes in medications.  2. Pre-syncope See above comments on hypertension. - EKG 12-Lead  3. Chronic atrial fibrillation (HCC) On Eliquis  4. Pure hypercholesterolemia On Lipitor  5. Postsurgical aortocoronary bypass status All risk factors treated post CABG.   No follow-ups on file.         Kenlee Maler Cranford Mon, MD  [facility] [phone] (phone) [phone] (fax)  [place]

## 2020-10-16 ENCOUNTER — Ambulatory Visit (INDEPENDENT_AMBULATORY_CARE_PROVIDER_SITE_OTHER): Payer: Medicare Other | Admitting: Family Medicine

## 2020-10-16 ENCOUNTER — Other Ambulatory Visit: Payer: Self-pay

## 2020-10-16 ENCOUNTER — Encounter: Payer: Self-pay | Admitting: Family Medicine

## 2020-10-16 DIAGNOSIS — I2510 Atherosclerotic heart disease of native coronary artery without angina pectoris: Secondary | ICD-10-CM | POA: Diagnosis not present

## 2020-10-16 DIAGNOSIS — I10 Essential (primary) hypertension: Secondary | ICD-10-CM

## 2020-10-16 DIAGNOSIS — E7800 Pure hypercholesterolemia, unspecified: Secondary | ICD-10-CM | POA: Diagnosis not present

## 2020-10-16 DIAGNOSIS — Z951 Presence of aortocoronary bypass graft: Secondary | ICD-10-CM

## 2020-10-16 DIAGNOSIS — I482 Chronic atrial fibrillation, unspecified: Secondary | ICD-10-CM

## 2020-10-16 DIAGNOSIS — R55 Syncope and collapse: Secondary | ICD-10-CM

## 2020-10-18 DIAGNOSIS — R42 Dizziness and giddiness: Secondary | ICD-10-CM | POA: Diagnosis not present

## 2020-10-20 ENCOUNTER — Other Ambulatory Visit: Payer: Self-pay | Admitting: Otolaryngology

## 2020-10-20 DIAGNOSIS — R42 Dizziness and giddiness: Secondary | ICD-10-CM

## 2020-10-20 DIAGNOSIS — H903 Sensorineural hearing loss, bilateral: Secondary | ICD-10-CM | POA: Diagnosis not present

## 2020-11-02 DIAGNOSIS — E278 Other specified disorders of adrenal gland: Secondary | ICD-10-CM | POA: Diagnosis not present

## 2020-11-03 ENCOUNTER — Ambulatory Visit
Admission: RE | Admit: 2020-11-03 | Discharge: 2020-11-03 | Disposition: A | Discharge status: Home / Self Care | Payer: Medicare Other | Source: Ambulatory Visit | Attending: Otolaryngology | Admitting: Otolaryngology

## 2020-11-03 ENCOUNTER — Other Ambulatory Visit: Payer: Self-pay

## 2020-11-03 DIAGNOSIS — R42 Dizziness and giddiness: Secondary | ICD-10-CM | POA: Insufficient documentation

## 2020-11-03 DIAGNOSIS — H9190 Unspecified hearing loss, unspecified ear: Secondary | ICD-10-CM | POA: Diagnosis not present

## 2020-11-03 DIAGNOSIS — R9082 White matter disease, unspecified: Secondary | ICD-10-CM | POA: Diagnosis not present

## 2020-11-03 DIAGNOSIS — R609 Edema, unspecified: Secondary | ICD-10-CM | POA: Diagnosis not present

## 2020-11-03 MED ORDER — GADOBUTROL 1 MMOL/ML IV SOLN
8.0000 mL | Freq: Once | INTRAVENOUS | Status: AC | PRN
  Administered 2020-11-03: 8 mL via INTRAVENOUS

## 2020-11-06 DIAGNOSIS — E278 Other specified disorders of adrenal gland: Secondary | ICD-10-CM | POA: Diagnosis not present

## 2020-12-19 ENCOUNTER — Other Ambulatory Visit (HOSPITAL_COMMUNITY): Payer: Self-pay | Admitting: Internal Medicine

## 2021-01-08 DIAGNOSIS — H903 Sensorineural hearing loss, bilateral: Secondary | ICD-10-CM | POA: Diagnosis not present

## 2021-01-08 DIAGNOSIS — H9313 Tinnitus, bilateral: Secondary | ICD-10-CM | POA: Diagnosis not present

## 2021-01-08 DIAGNOSIS — H6123 Impacted cerumen, bilateral: Secondary | ICD-10-CM | POA: Diagnosis not present

## 2021-01-08 DIAGNOSIS — R42 Dizziness and giddiness: Secondary | ICD-10-CM | POA: Diagnosis not present

## 2021-01-09 ENCOUNTER — Encounter (INDEPENDENT_AMBULATORY_CARE_PROVIDER_SITE_OTHER): Payer: Self-pay | Admitting: Vascular Surgery

## 2021-01-09 ENCOUNTER — Other Ambulatory Visit: Payer: Self-pay

## 2021-01-09 ENCOUNTER — Ambulatory Visit (INDEPENDENT_AMBULATORY_CARE_PROVIDER_SITE_OTHER): Payer: Medicare Other | Admitting: Vascular Surgery

## 2021-01-09 VITALS — BP 131/76 | HR 78 | Resp 17 | Ht 70.0 in | Wt 192.0 lb

## 2021-01-09 DIAGNOSIS — E7800 Pure hypercholesterolemia, unspecified: Secondary | ICD-10-CM | POA: Diagnosis not present

## 2021-01-09 DIAGNOSIS — R42 Dizziness and giddiness: Secondary | ICD-10-CM | POA: Diagnosis not present

## 2021-01-09 DIAGNOSIS — I6529 Occlusion and stenosis of unspecified carotid artery: Secondary | ICD-10-CM

## 2021-01-09 DIAGNOSIS — I10 Essential (primary) hypertension: Secondary | ICD-10-CM | POA: Diagnosis not present

## 2021-01-09 NOTE — Progress Notes (Signed)
Patient ID: Micheal Wright, male   DOB: 1941/10/12, 80 y.o.   MRN: [id number]  Chief Complaint  Patient presents with  . New Patient (Initial Visit)    Dizziness and giddiness    HPI Micheal Wright is a 80 y.o. male.  I am asked to see the patient by Dr. Pryor Ochoa for evaluation of extracranial cerebrovascular disease as well as dizziness with presyncope.  The patient reports several episodes of feeling like he was going to pass out urgently.  This went on for a couple of months.  He has not had any of the symptoms in a month or so.  He underwent an MRI of the brain in early December which showed no obvious intracranial abnormality and no obvious vascular issues.  He had a carotid duplex performed in November of last year which I have reviewed which showed mild carotid disease bilaterally without hemodynamic significance and normal antegrade vertebral arteries bilaterally.   Past Medical History:  Diagnosis Date  . APC (atrial premature contractions) 05/04/2014  . 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 Atherosclerosis of coronary artery 11/08/2008   All risk factors treated.   . Atrial complex, premature 05/04/2014  . CAD (coronary artery disease)    s/p CABG 1998; Last cath 2002 in setting of marked ST elevation during stress testing. patent grafts; Echo 8/09 EF 55%. No significant valvular disease.  . Cardiac murmur 09/03/2011   Overview:  Overview:  Last Assessment & Plan:  Likely mild aortic valve sclerosis. Will check echo.    . Carotid artery disease ([place])    u/s 9/10: R 40-59% L 0-39%  . Carotid artery obstruction 08/22/2009   Overview:  Overview:  Qualifier: Diagnosis of  By: Haroldine Laws, MD, Eileen Stanford  Last Assessment & Plan:  Asymptomatic. Due for repeat u/s.  Overview:  Overview:  Qualifier: Diagnosis of  By: Haroldine Laws, MD, Eileen Stanford  Last Assessment & Plan:  Asymptomatic. Due for repeat u/s.    . Carotid stenosis 08/22/2009   Qualifier: Diagnosis of  By: Haroldine Laws, MD, Eileen Stanford Cerebrovascular disease 11/17/2008  . Chest tightness 01/20/2015  . CHF (congestive heart failure) ([place])   . Chronic atrial fibrillation ([place]) 03/02/2018  . Coronary artery disease 05/11/2007   Qualifier: Diagnosis of  By: Lurlean Nanny LPN, [place]    . Erectile dysfunction   . Essential hypertension 05/11/2007   Overview:  Overview:  Overview:  Qualifier: Diagnosis of  By: Lurlean Nanny LPN, Regina   Last Assessment & Plan:  BP up slightly here but when he checks at [employer] usually 120/65. Will continue current regimen.  Qualifier: Diagnosis of  By: Lurlean Nanny LPN, Regina     . Family history of breast cancer   . Family history of colon cancer   . Family history of colonic polyps   . GERD (gastroesophageal reflux disease)   . History of colonic polyps   . Hypercholesteremia 11/08/2008  . Hyperlipidemia   . Hypertension   . Murmur, cardiac 09/03/2011  . PAC (premature atrial contraction) 05/04/2014  . 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 Sleep apnea     Past Surgical History:  Procedure Laterality Date  . ANGIOPLASTY     x 2  . APPENDECTOMY  1960's  . CARDIAC CATHETERIZATION  2002  . CARDIOVERSION N/A 11/14/2015   Procedure: CARDIOVERSION;  Surgeon: Jolaine Artist, MD;  Location: [facility] ENDOSCOPY;  Service: Cardiovascular;  Laterality: N/A;  . COLONOSCOPY W/ POLYPECTOMY    .  COLONOSCOPY WITH PROPOFOL N/A 11/13/2015   Procedure: COLONOSCOPY WITH PROPOFOL;  Surgeon: Manya Silvas, MD;  Location: [facility] ENDOSCOPY;  Service: Endoscopy;  Laterality: N/A;  . COLONOSCOPY WITH PROPOFOL N/A 02/01/2019   Procedure: COLONOSCOPY WITH PROPOFOL;  Surgeon: Manya Silvas, MD;  Location: [facility] ENDOSCOPY;  Service: Endoscopy;  Laterality: N/A;  . CORONARY ARTERY BYPASS GRAFT  1998  . ESOPHAGOGASTRODUODENOSCOPY  04/2004   Inflam. at Z line (biopsy neg)  . ESOPHAGOGASTRODUODENOSCOPY N/A 02/01/2019   Procedure: ESOPHAGOGASTRODUODENOSCOPY (EGD);  Surgeon: Manya Silvas, MD;  Location: [facility]  ENDOSCOPY;  Service: Endoscopy;  Laterality: N/A;  . HERPES SIMPLEX VIRUS DFA N/A   . KNEE ARTHROSCOPY W/ PARTIAL MEDIAL MENISCECTOMY  1990s   Left knee  . MRI    . sleep study    . UVULOPALATOPHARYNGOPLASTY      Family History  Problem Relation Age of Onset  . Stroke Mother   . Heart attack Father   . Alcohol abuse Father   . Breast cancer Sister        58  . Heart disease Sister   . Rheumatologic disease Brother        Heart  . Heart disease Brother   . Breast cancer Maternal Aunt        dx 30s d. 3s  . Colon cancer Paternal Uncle        dx 2s  . Colon polyps Son        5-6  . Hypertension Neg Hx   . Diabetes Neg Hx   . Prostate cancer Neg Hx      Social History   Tobacco Use  . Smoking status: Former Research scientist (life sciences)  . Smokeless tobacco: Never Used  . Tobacco comment: quit around age [age], did not inhale  Vaping Use  . Vaping Use: Never used  Substance Use Topics  . Alcohol use: No  . Drug use: No    No Known Allergies  Current Outpatient Medications  Medication Sig Dispense Refill  . amLODipine (NORVASC) 5 MG tablet TAKE 1 TABLET BY MOUTH ONCE A DAY 30 tablet 5  . atorvastatin (LIPITOR) 80 MG tablet TAKE 1 TABLET BY MOUTH DAILY 90 tablet 0  . ELIQUIS 5 MG TABS tablet TAKE 1 TABLET BY MOUTH TWICE A DAY 180 tablet 0  . hydrochlorothiazide (MICROZIDE) 12.5 MG capsule Take 1 capsule (12.5 mg total) by mouth daily. 90 capsule 3  . losartan (COZAAR) 50 MG tablet TAKE 1 TABLET BY MOUTH ONCE A DAY 90 tablet 3  . NITROSTAT 0.4 MG SL tablet DISSOLVE 1 TABLET UNDER THE TONGUE FOR CHEST PAIN. MAY REPEAT EVERY 5MINUTES UP TO 3 DOSES. IF NO RELIEF, CALL 911** 25 tablet 3  . omeprazole (PRILOSEC) 40 MG capsule TAKE 1 CAPSULE BY MOUTH ONCE DAILY 30 capsule 12   No current facility-administered medications for this visit.      REVIEW OF SYSTEMS (Negative unless checked)  Constitutional: [] Weight loss  [] Fever  [] Chills Cardiac: [] Chest pain   [] Chest pressure    [] Palpitations   [] Shortness of breath when laying flat   [] Shortness of breath at rest   [] Shortness of breath with exertion. Vascular:  [] Pain in legs with walking   [] Pain in legs at rest   [] Pain in legs when laying flat   [] Claudication   [] Pain in feet when walking  [] Pain in feet at rest  [] Pain in feet when laying flat   [] History of DVT   [] Phlebitis   [] Swelling in legs   []   Varicose veins   [] Non-healing ulcers Pulmonary:   [] Uses home oxygen   [] Productive cough   [] Hemoptysis   [] Wheeze  [] COPD   [] Asthma Neurologic:  [x] Dizziness  [x] Blackouts   [] Seizures   [] History of stroke   [] History of TIA  [] Aphasia   [] Temporary blindness   [] Dysphagia   [] Weakness or numbness in arms   [] Weakness or numbness in legs Musculoskeletal:  [x] Arthritis   [] Joint swelling   [x] Joint pain   [] Low back pain Hematologic:  [] Easy bruising  [] Easy bleeding   [] Hypercoagulable state   [] Anemic  [] Hepatitis Gastrointestinal:  [] Blood in stool   [] Vomiting blood  [] Gastroesophageal reflux/heartburn   [] Abdominal pain Genitourinary:  [] Chronic kidney disease   [] Difficult urination  [] Frequent urination  [] Burning with urination   [] Hematuria Skin:  [] Rashes   [] Ulcers   [] Wounds Psychological:  [] History of anxiety   []  History of major depression.    Physical Exam BP 131/76 (BP Location: Left Arm)   Pulse 78   Resp 17   Ht 5\' 10"  (1.778 m)   Wt 192 lb (87.1 kg)   BMI 27.55 kg/m  Gen:  WD/WN, NAD Head: [place]/AT, No temporalis wasting.  Ear/Nose/Throat: Hearing diminished, nares w/o erythema or drainage, oropharynx w/o Erythema/Exudate Eyes: Conjunctiva clear, sclera non-icteric  Neck: trachea midline.  No bruit or JVD.  Pulmonary:  Good air movement, clear to auscultation bilaterally.  Cardiac: RRR, normal S1, S2 Vascular:  Vessel Right Left  Radial Palpable Palpable                          PT Palpable Palpable  DP Palpable Palpable   Gastrointestinal: soft,  non-tender/non-distended. Musculoskeletal: M/S 5/5 throughout.  Extremities without ischemic changes.  No deformity or atrophy. No edema. Neurologic: Sensation grossly intact in extremities.  Symmetrical.  Speech is fluent. Motor exam as listed above. Psychiatric: Judgment intact, Mood & affect appropriate for pt's clinical situation. Dermatologic: No rashes or ulcers noted.  No cellulitis or open wounds.    Radiology No results found.  Labs No results found for this or any previous visit (from the past 2160 hour(s)).  Assessment/Plan:  Essential (primary) hypertension blood pressure control important in reducing the progression of atherosclerotic disease. On appropriate oral medications.   Hyperlipidemia lipid control important in reducing the progression of atherosclerotic disease. Continue statin therapy   Dizziness and giddiness Not entirely clear of the cause of his dizziness and presyncope, but his mild carotid disease would not be expected to cause this.  Carotid stenosis He had a carotid duplex performed in November of last year which I have reviewed which showed mild carotid disease bilaterally without hemodynamic significance and normal antegrade vertebral arteries bilaterally.  No role for intervention at this level.  Unlikely to be the cause of his symptoms.  At this point, we can check this on an annual basis.      Leotis Pain 01/09/2021, 1:52 PM   This note was created with Dragon medical transcription system.  Any errors from dictation are unintentional.

## 2021-01-09 NOTE — Assessment & Plan Note (Signed)
blood pressure control important in reducing the progression of atherosclerotic disease. On appropriate oral medications.  

## 2021-01-09 NOTE — Assessment & Plan Note (Signed)
lipid control important in reducing the progression of atherosclerotic disease. Continue statin therapy  

## 2021-01-09 NOTE — Assessment & Plan Note (Signed)
He had a carotid duplex performed in November of last year which I have reviewed which showed mild carotid disease bilaterally without hemodynamic significance and normal antegrade vertebral arteries bilaterally.  No role for intervention at this level.  Unlikely to be the cause of his symptoms.  At this point, we can check this on an annual basis.

## 2021-01-09 NOTE — Assessment & Plan Note (Signed)
Not entirely clear of the cause of his dizziness and presyncope, but his mild carotid disease would not be expected to cause this.

## 2021-01-18 ENCOUNTER — Other Ambulatory Visit (HOSPITAL_COMMUNITY): Payer: Self-pay | Admitting: Internal Medicine

## 2021-01-18 DIAGNOSIS — I2510 Atherosclerotic heart disease of native coronary artery without angina pectoris: Secondary | ICD-10-CM

## 2021-02-27 DIAGNOSIS — E278 Other specified disorders of adrenal gland: Secondary | ICD-10-CM | POA: Diagnosis not present

## 2021-03-30 DIAGNOSIS — H2513 Age-related nuclear cataract, bilateral: Secondary | ICD-10-CM | POA: Diagnosis not present

## 2021-04-17 ENCOUNTER — Other Ambulatory Visit (HOSPITAL_COMMUNITY): Payer: Self-pay | Admitting: Internal Medicine

## 2021-04-17 DIAGNOSIS — I2510 Atherosclerotic heart disease of native coronary artery without angina pectoris: Secondary | ICD-10-CM

## 2021-04-25 ENCOUNTER — Telehealth: Payer: Self-pay

## 2021-04-25 DIAGNOSIS — D649 Anemia, unspecified: Secondary | ICD-10-CM

## 2021-04-25 DIAGNOSIS — I10 Essential (primary) hypertension: Secondary | ICD-10-CM

## 2021-04-25 DIAGNOSIS — N401 Enlarged prostate with lower urinary tract symptoms: Secondary | ICD-10-CM

## 2021-04-25 DIAGNOSIS — E7800 Pure hypercholesterolemia, unspecified: Secondary | ICD-10-CM

## 2021-04-25 DIAGNOSIS — G629 Polyneuropathy, unspecified: Secondary | ICD-10-CM

## 2021-04-25 DIAGNOSIS — R350 Frequency of micturition: Secondary | ICD-10-CM

## 2021-04-25 NOTE — Telephone Encounter (Signed)
Patient wants to have his labs done before his visit for his CPE on 06/13/21.  Please let patient know if he can do this.

## 2021-04-25 NOTE — Telephone Encounter (Signed)
Labs ordered.  Patient advised.

## 2021-06-07 DIAGNOSIS — E7800 Pure hypercholesterolemia, unspecified: Secondary | ICD-10-CM | POA: Diagnosis not present

## 2021-06-07 DIAGNOSIS — N401 Enlarged prostate with lower urinary tract symptoms: Secondary | ICD-10-CM | POA: Diagnosis not present

## 2021-06-07 DIAGNOSIS — R350 Frequency of micturition: Secondary | ICD-10-CM | POA: Diagnosis not present

## 2021-06-07 DIAGNOSIS — I10 Essential (primary) hypertension: Secondary | ICD-10-CM | POA: Diagnosis not present

## 2021-06-07 DIAGNOSIS — D649 Anemia, unspecified: Secondary | ICD-10-CM | POA: Diagnosis not present

## 2021-06-08 LAB — COMPREHENSIVE METABOLIC PANEL
ALT: 15 IU/L (ref 0–44)
AST: 21 IU/L (ref 0–40)
Albumin/Globulin Ratio: 1.4 (ref 1.2–2.2)
Albumin: 4.4 g/dL (ref 3.7–4.7)
Alkaline Phosphatase: 129 IU/L — ABNORMAL HIGH (ref 44–121)
BUN/Creatinine Ratio: 9 — ABNORMAL LOW (ref 10–24)
BUN: 11 mg/dL (ref 8–27)
Bilirubin Total: 1.5 mg/dL — ABNORMAL HIGH (ref 0.0–1.2)
CO2: 26 mmol/L (ref 20–29)
Calcium: 9.8 mg/dL (ref 8.6–10.2)
Chloride: 100 mmol/L (ref 96–106)
Creatinine, Ser: 1.24 mg/dL (ref 0.76–1.27)
Globulin, Total: 3.1 g/dL (ref 1.5–4.5)
Glucose: 100 mg/dL — ABNORMAL HIGH (ref 65–99)
Potassium: 4.9 mmol/L (ref 3.5–5.2)
Sodium: 138 mmol/L (ref 134–144)
Total Protein: 7.5 g/dL (ref 6.0–8.5)
eGFR: 59 mL/min/{1.73_m2} — ABNORMAL LOW (ref >59)

## 2021-06-08 LAB — CBC WITH DIFFERENTIAL/PLATELET
Basophils Absolute: 0 10*3/uL (ref 0.0–0.2)
Basos: 1 %
EOS (ABSOLUTE): 0.1 10*3/uL (ref 0.0–0.4)
Eos: 2 %
Hematocrit: 39.6 % (ref 37.5–51.0)
Hemoglobin: 13.5 g/dL (ref 13.0–17.7)
Immature Grans (Abs): 0 10*3/uL (ref 0.0–0.1)
Immature Granulocytes: 0 %
Lymphocytes Absolute: 2.5 10*3/uL (ref 0.7–3.1)
Lymphs: 44 %
MCH: 31.8 pg (ref 26.6–33.0)
MCHC: 34.1 g/dL (ref 31.5–35.7)
MCV: 93 fL (ref 79–97)
Monocytes Absolute: 0.6 10*3/uL (ref 0.1–0.9)
Monocytes: 11 %
Neutrophils Absolute: 2.3 10*3/uL (ref 1.4–7.0)
Neutrophils: 42 %
Platelets: 261 10*3/uL (ref 150–450)
RBC: 4.24 x10E6/uL (ref 4.14–5.80)
RDW: 13.1 % (ref 11.6–15.4)
WBC: 5.6 10*3/uL (ref 3.4–10.8)

## 2021-06-08 LAB — LIPID PANEL
Chol/HDL Ratio: 3 ratio (ref 0.0–5.0)
Cholesterol, Total: 100 mg/dL (ref 100–199)
HDL: 33 mg/dL — ABNORMAL LOW (ref >39)
LDL Chol Calc (NIH): 51 mg/dL (ref 0–99)
Triglycerides: 80 mg/dL (ref 0–149)
VLDL Cholesterol Cal: 16 mg/dL (ref 5–40)

## 2021-06-08 LAB — TSH: TSH: 1.51 u[IU]/mL (ref 0.450–4.500)

## 2021-06-08 LAB — PSA: Prostate Specific Ag, Serum: 2.9 ng/mL (ref 0.0–4.0)

## 2021-06-13 ENCOUNTER — Other Ambulatory Visit: Payer: Self-pay

## 2021-06-13 ENCOUNTER — Ambulatory Visit (INDEPENDENT_AMBULATORY_CARE_PROVIDER_SITE_OTHER): Payer: Medicare Other | Admitting: Family Medicine

## 2021-06-13 ENCOUNTER — Encounter: Payer: Self-pay | Admitting: Family Medicine

## 2021-06-13 VITALS — BP 114/70 | HR 82 | Resp 16 | Wt 194.8 lb

## 2021-06-13 DIAGNOSIS — I25810 Atherosclerosis of coronary artery bypass graft(s) without angina pectoris: Secondary | ICD-10-CM

## 2021-06-13 DIAGNOSIS — E7800 Pure hypercholesterolemia, unspecified: Secondary | ICD-10-CM | POA: Diagnosis not present

## 2021-06-13 DIAGNOSIS — I6529 Occlusion and stenosis of unspecified carotid artery: Secondary | ICD-10-CM

## 2021-06-13 DIAGNOSIS — I482 Chronic atrial fibrillation, unspecified: Secondary | ICD-10-CM | POA: Diagnosis not present

## 2021-06-13 DIAGNOSIS — I10 Essential (primary) hypertension: Secondary | ICD-10-CM

## 2021-06-13 DIAGNOSIS — Z951 Presence of aortocoronary bypass graft: Secondary | ICD-10-CM

## 2021-06-13 DIAGNOSIS — G629 Polyneuropathy, unspecified: Secondary | ICD-10-CM | POA: Diagnosis not present

## 2021-06-13 DIAGNOSIS — R011 Cardiac murmur, unspecified: Secondary | ICD-10-CM | POA: Diagnosis not present

## 2021-06-13 DIAGNOSIS — D649 Anemia, unspecified: Secondary | ICD-10-CM | POA: Diagnosis not present

## 2021-06-13 NOTE — Patient Instructions (Addendum)
Managing Your Hypertension Hypertension, also called high blood pressure, is when the force of the blood pressing against the walls of the arteries is too strong. Arteries are blood vessels that carry blood from your heart throughout your body. Hypertension forces the heart to work harder to pump blood and may cause the arteries tobecome narrow or stiff. Understanding blood pressure readings Your personal target blood pressure may vary depending on your medical conditions, your age, and other factors. A blood pressure reading includes a higher number over a lower number. Ideally, your blood pressure should be below 120/80. You should know that: The first, or top, number is called the systolic pressure. It is a measure of the pressure in your arteries as your heart beats. The second, or bottom number, is called the diastolic pressure. It is a measure of the pressure in your arteries as the heart relaxes. Blood pressure is classified into four stages. Based on your blood pressure reading, your health care provider may use the following stages to determine what type of treatment you need, if any. Systolic pressure and diastolicpressure are measured in a unit called mmHg. Normal Systolic pressure: below 120. Diastolic pressure: below 80. Elevated Systolic pressure: 120-129. Diastolic pressure: below 80. Hypertension stage 1 Systolic pressure: 130-139. Diastolic pressure: 80-89. Hypertension stage 2 Systolic pressure: 140 or above. Diastolic pressure: 90 or above. How can this condition affect me? Managing your hypertension is an important responsibility. Over time, hypertension can damage the arteries and decrease blood flow to important parts of the body, including the brain, heart, and kidneys. Having untreated or uncontrolled hypertension can lead to: A heart attack. A stroke. A weakened blood vessel (aneurysm). Heart failure. Kidney damage. Eye damage. Metabolic syndrome. Memory and  concentration problems. Vascular dementia. What actions can I take to manage this condition? Hypertension can be managed by making lifestyle changes and possibly by taking medicines. Your health care provider will help you make a plan to bring yourblood pressure within a normal range. Nutrition  Eat a diet that is high in fiber and potassium, and low in salt (sodium), added sugar, and fat. An example eating plan is called the Dietary Approaches to Stop Hypertension (DASH) diet. To eat this way: Eat plenty of fresh fruits and vegetables. Try to fill one-half of your plate at each meal with fruits and vegetables. Eat whole grains, such as whole-wheat pasta, brown rice, or whole-grain bread. Fill about one-fourth of your plate with whole grains. Eat low-fat dairy products. Avoid fatty cuts of meat, processed or cured meats, and poultry with skin. Fill about one-fourth of your plate with lean proteins such as fish, chicken without skin, beans, eggs, and tofu. Avoid pre-made and processed foods. These tend to be higher in sodium, added sugar, and fat. Reduce your daily sodium intake. Most people with hypertension should eat less than 1,500 mg of sodium a day.  Lifestyle  Work with your health care provider to maintain a healthy body weight or to lose weight. Ask what an ideal weight is for you. Get at least 30 minutes of exercise that causes your heart to beat faster (aerobic exercise) most days of the week. Activities may include walking, swimming, or biking. Include exercise to strengthen your muscles (resistance exercise), such as weight lifting, as part of your weekly exercise routine. Try to do these types of exercises for 30 minutes at least 3 days a week. Do not use any products that contain nicotine or tobacco, such as cigarettes, e-cigarettes, and chewing   tobacco. If you need help quitting, ask your health care provider. Control any long-term (chronic) conditions you have, such as high  cholesterol or diabetes. Identify your sources of stress and find ways to manage stress. This may include meditation, deep breathing, or making time for fun activities.  Alcohol use Do not drink alcohol if: Your health care provider tells you not to drink. You are pregnant, may be pregnant, or are planning to become pregnant. If you drink alcohol: Limit how much you use to: 0-1 drink a day for women. 0-2 drinks a day for men. Be aware of how much alcohol is in your drink. In the [place] one drink equals one 12 oz bottle of beer (355 mL), one 5 oz glass of wine (148 mL), or one 1 oz glass of hard liquor (44 mL). Medicines Your health care provider may prescribe medicine if lifestyle changes are not enough to get your blood pressure under control and if: Your systolic blood pressure is 130 or higher. Your diastolic blood pressure is 80 or higher. Take medicines only as told by your health care provider. Follow the directions carefully. Blood pressure medicines must be taken as told by your health care provider. The medicine does not work as well when you skip doses. Skippingdoses also puts you at risk for problems. Monitoring Before you monitor your blood pressure: Do not smoke, drink caffeinated beverages, or exercise within 30 minutes before taking a measurement. Use the bathroom and empty your bladder (urinate). Sit quietly for at least 5 minutes before taking measurements. Monitor your blood pressure at home as told by your health care provider. To do this: Sit with your back straight and supported. Place your feet flat on the floor. Do not cross your legs. Support your arm on a flat surface, such as a table. Make sure your upper arm is at heart level. Each time you measure, take two or three readings one minute apart and record the results. You may also need to have your blood pressure checked regularly by your healthcare provider. General information Talk with your health care  provider about your diet, exercise habits, and other lifestyle factors that may be contributing to hypertension. Review all the medicines you take with your health care provider because there may be side effects or interactions. Keep all visits as told by your health care provider. Your health care provider can help you create and adjust your plan for managing your high blood pressure. Where to find more information National Heart, Lung, and Blood Institute: www.nhlbi.nih.gov American Heart Association: www.heart.org Contact a health care provider if: You think you are having a reaction to medicines you have taken. You have repeated (recurrent) headaches. You feel dizzy. You have swelling in your ankles. You have trouble with your vision. Get help right away if: You develop a severe headache or confusion. You have unusual weakness or numbness, or you feel faint. You have severe pain in your chest or abdomen. You vomit repeatedly. You have trouble breathing. These symptoms may represent a serious problem that is an emergency. Do not wait to see if the symptoms will go away. Get medical help right away. Call your local emergency services (911 in the [place] Do not drive yourself to the hospital. Summary Hypertension is when the force of blood pumping through your arteries is too strong. If this condition is not controlled, it may put you at risk for serious complications. Your personal target blood pressure may vary depending on your medical conditions,   your age, and other factors. For most people, a normal blood pressure is less than 120/80. Hypertension is managed by lifestyle changes, medicines, or both. Lifestyle changes to help manage hypertension include losing weight, eating a healthy, low-sodium diet, exercising more, stopping smoking, and limiting alcohol. This information is not intended to replace advice given to you by your health care provider. Make sure you discuss any questions  you have with your healthcare provider. Document Revised: 12/24/2019 Document Reviewed: 10/19/2019 Elsevier Patient Education  2022 [place].   Preventing High Cholesterol Cholesterol is a white, waxy substance similar to fat that the human body needs to help build cells. The liver makes all the cholesterol that a person's body needs. Having high cholesterol (hypercholesterolemia) increases your risk for heart disease and stroke. Extra or excess cholesterolcomes from the food that you eat. High cholesterol can often be prevented with diet and lifestyle changes. If you already have high cholesterol, you can control it with diet, lifestyle changes,and medicines. How can high cholesterol affect me? If you have high cholesterol, fatty deposits (plaques) may build up on the walls of your blood vessels. The blood vessels that carry blood away from your heart are called arteries. Plaques make the arteries narrower and stiffer. This in turn can: Restrict or block blood flow and cause blood clots to form. Increase your risk for heart attack and stroke. What can increase my risk for high cholesterol? This condition is more likely to develop in people who: Eat foods that are high in saturated fat or cholesterol. Saturated fat is mostly found in foods that come from animal sources. Are overweight. Are not getting enough exercise. Have a family history of high cholesterol (familial hypercholesterolemia). What actions can I take to prevent this? Nutrition  Eat less saturated fat. Avoid trans fats (partially hydrogenated oils). These are often found in margarine and in some baked goods, fried foods, and snacks bought in packages. Avoid precooked or cured meat, such as bacon, sausages, or meat loaves. Avoid foods and drinks that have added sugars. Eat more fruits, vegetables, and whole grains. Choose healthy sources of protein, such as fish, poultry, lean cuts of red meat, beans, peas, lentils, and  nuts. Choose healthy sources of fat, such as: Nuts. Vegetable oils, especially olive oil. Fish that have healthy fats, such as omega-3 fatty acids. These fish include mackerel or salmon.  Lifestyle Lose weight if you are overweight. Maintaining a healthy body mass index (BMI) can help prevent or control high cholesterol. It can also lower your risk for diabetes and high blood pressure. Ask your health care provider to help you with a diet and exercise plan to lose weight safely. Do not use any products that contain nicotine or tobacco, such as cigarettes, e-cigarettes, and chewing tobacco. If you need help quitting, ask your health care provider. Alcohol use Do not drink alcohol if: Your health care provider tells you not to drink. You are pregnant, may be pregnant, or are planning to become pregnant. If you drink alcohol: Limit how much you use to: 0-1 drink a day for women. 0-2 drinks a day for men. Be aware of how much alcohol is in your drink. In the [place] one drink equals one 12 oz bottle of beer (355 mL), one 5 oz glass of wine (148 mL), or one 1 oz glass of hard liquor (44 mL). Activity  Get enough exercise. Do exercises as told by your health care provider. Each week, do at least 150 minutes of  exercise that takes a medium level of effort (moderate-intensity exercise). This kind of exercise: Makes your heart beat faster while allowing you to still be able to talk. Can be done in short sessions several times a day or longer sessions a few times a week. For example, on 5 days each week, you could walk fast or ride your bike 3 times a day for 10 minutes each time.  Medicines Your health care provider may recommend medicines to help lower cholesterol. This may be a medicine to lower the amount of cholesterol that your liver makes. You may need medicine if: Diet and lifestyle changes have not lowered your cholesterol enough. You have high cholesterol and other risk factors for heart  disease or stroke. Take over-the-counter and prescription medicines only as told by your health care provider. General information Manage your risk factors for high cholesterol. Talk with your health care provider about all your risk factors and how to lower your risk. Manage other conditions that you have, such as diabetes or high blood pressure (hypertension). Have blood tests to check your cholesterol levels at regular points in time as told by your health care provider. Keep all follow-up visits as told by your health care provider. This is important. Where to find more information American Heart Association: www.heart.org National Heart, Lung, and Blood Institute: https://wilson-eaton.com/ Summary High cholesterol increases your risk for heart disease and stroke. By keeping your cholesterol level low, you can reduce your risk for these conditions. High cholesterol can often be prevented with diet and lifestyle changes. Work with your health care provider to manage your risk factors, and have your blood tested regularly. This information is not intended to replace advice given to you by your health care provider. Make sure you discuss any questions you have with your healthcare provider. Document Revised: 08/31/2019 Document Reviewed: 08/31/2019 Elsevier Patient Education  [place].

## 2021-06-13 NOTE — Progress Notes (Signed)
Established patient visit   Patient: Micheal Wright   DOB: [birth year]   80 y.o. Male  MRN: [id number] Visit Date: 06/13/2021  Today's healthcare provider: [name], MD   Chief Complaint  Patient presents with   Follow-up   Subjective    HPI  Patient comes in today for follow-up. .  Father of 3 sons ten child grandchildren and 1 great grandchild due tomorrow. 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 Hypertension, follow-up  BP Readings from Last 3 Encounters:  06/13/21 114/70  01/09/21 131/76  06/12/20 126/66   Wt Readings from Last 3 Encounters:  06/13/21 194 lb 12.8 oz (88.4 kg)  01/09/21 192 lb (87.1 kg)  06/12/20 190 lb 12.8 oz (86.5 kg)     He was last seen for hypertension 8 months ago.  Management since that visit includes; continue medicatons. He reports excellent compliance with treatment. He is not having side effects.  He is not exercising. He is not adherent to low salt diet.   Outside blood pressures are systolic 213-086 and diastolic in the 57Q.  He does not smoke.  Use of agents associated with hypertension: none.   --------------------------------------------------------------------------------------------------- Lipid/Cholesterol, follow-up  Last Lipid Panel: Lab Results  Component Value Date   CHOL 100 06/07/2021   LDLCALC 51 06/07/2021   HDL 33 (L) 06/07/2021   TRIG 80 06/07/2021    He was last seen for this 1 years ago.  Management since that visit includes; on pravastatin.  He reports excellent compliance with treatment. He is not having side effects.   He is following a Regular diet. Current exercise: no regular exercise  Last metabolic panel Lab Results  Component Value Date   GLUCOSE 100 (H) 06/07/2021   NA 138 06/07/2021   K 4.9 06/07/2021   BUN 11 06/07/2021   CREATININE 1.24 06/07/2021   GFRNONAA 66 06/13/2020   GFRAA 76  06/13/2020   CALCIUM 9.8 06/07/2021   AST 21 06/07/2021   ALT 15 06/07/2021   The ASCVD Risk score (Goff DC Jr., et al., 2013) failed to calculate for the following reasons:   The 2013 ASCVD risk score is only valid for ages 47 to 69  ---------------------------------------------------------------------------------------------------  Patient had AWV with [facility] on 10/04/2020.  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 09/05/2019   Constipation 09/05/2019   Genetic testing 06/01/2019   Family history of breast cancer    Family history of colon cancer    Family history of colonic polyps    Anemia 03/01/2019   Change in bowel habits 03/01/2019   Elevated alkaline phosphatase level 03/01/2019   Enlarged prostate 03/01/2019   Serum total bilirubin elevated 03/01/2019   Adrenal nodule ([place]) 02/25/2019   Acquired trigger finger 12/10/2018   Chronic atrial fibrillation ([place]) 03/02/2018   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   Basal cell carcinoma of skin 10/16/2015   Benign prostatic hyperplasia with urinary obstruction 10/16/2015   Neuropathy 10/16/2015   Posthitis 01/10/2015   PAC (premature atrial contraction) 05/04/2014   Atrial complex, premature 05/04/2014   APC (atrial premature contractions) 05/04/2014  Premature atrial contraction 05/04/2014   Blood in semen 08/31/2012   Hematospermia 08/31/2012   Murmur, cardiac 09/03/2011   Cardiac murmur 09/03/2011   Cardiac murmur, unspecified 09/03/2011   Mononeuritis 01/02/2010   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   ERECTILE DYSFUNCTION, 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   DISTURBANCE OF SKIN SENSATION  12/21/2009   Carotid stenosis 08/22/2009   Carotid artery obstruction 08/22/2009   Occlusion and stenosis of unspecified carotid artery 08/22/2009   Low back pain 03/24/2009   Dorsalgia, unspecified 03/24/2009   Cerebrovascular disease 11/17/2008   Atherosclerosis of coronary artery 11/08/2008   Hypercholesteremia 11/08/2008   COLD SORE 11/12/2007   Herpes 11/12/2007   Herpesviral infection, unspecified 11/12/2007   Hyperlipidemia 05/11/2007   ERECTILE DYSFUNCTION 05/11/2007   Essential hypertension 05/11/2007   Coronary artery disease 05/11/2007   GERD 05/11/2007   Sleep apnea 05/11/2007   COLONIC POLYPS, HX OF 05/11/2007   Gastro-esophageal reflux disease without esophagitis 05/11/2007   History of colon polyps 05/11/2007   Atherosclerotic heart disease of native coronary artery without angina pectoris 05/11/2007   Essential (primary) hypertension 05/11/2007   Past Medical History:  Diagnosis Date   APC (atrial premature contractions) 05/04/2014   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    Atrial complex, premature 05/04/2014   CAD (coronary artery disease)    s/p CABG 1998; Last cath 2002 in setting of marked ST elevation during stress testing. patent grafts; Echo 8/09 EF 55%. No significant valvular disease.   Cardiac murmur 09/03/2011   Overview:  Overview:  Last Assessment & Plan:  Likely mild aortic valve sclerosis. Will check echo.     Carotid artery disease ([place])    u/s 9/10: R 40-59% L 0-39%   Carotid artery obstruction 08/22/2009   Overview:  Overview:  Qualifier: Diagnosis of  By: Haroldine Laws, MD, Eileen Stanford  Last Assessment & Plan:  Asymptomatic. Due for repeat u/s.  Overview:  Overview:  Qualifier: Diagnosis of  By: Haroldine Laws, MD, Eileen Stanford  Last Assessment & Plan:  Asymptomatic. Due for repeat u/s.     Carotid stenosis 08/22/2009   Qualifier: Diagnosis of  By: Haroldine Laws, MD, Eileen Stanford    Cerebrovascular disease 11/17/2008   Chest tightness 01/20/2015   CHF (congestive heart failure) ([place])    Chronic atrial fibrillation ([place]) 03/02/2018   Coronary artery disease 05/11/2007   Qualifier: Diagnosis of  By: Lurlean Nanny LPN, Rollene Fare     Erectile dysfunction    Essential hypertension 05/11/2007   Overview:  Overview:  Overview:  Qualifier: Diagnosis of  By: Lurlean Nanny LPN, [place]   Last Assessment & Plan:  BP up slightly here but when he checks at [employer] usually 120/65. Will continue current regimen.  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   losartan (COZAAR) 50 MG tablet TAKE 1 TABLET BY MOUTH ONCE A DAY   NITROSTAT 0.4 MG SL tablet DISSOLVE 1 TABLET UNDER THE TONGUE FOR CHEST PAIN. MAY REPEAT EVERY 5MINUTES UP TO 3 DOSES. IF NO RELIEF, CALL 911**   omeprazole (PRILOSEC) 40 MG capsule TAKE 1 CAPSULE BY MOUTH ONCE DAILY   No facility-administered medications prior to visit.    Review of Systems      Objective    BP 114/70   Pulse 82   Resp 16   Wt 194 lb 12.8 oz (88.4 kg)   SpO2 99%   BMI 27.95 kg/m  BP Readings from Last 3 Encounters:  06/15/21 (!) 146/56  06/13/21 114/70  01/09/21 131/76   Wt  Readings from Last 3 Encounters:  06/15/21 194 lb 9.6 oz (88.3 kg)  06/13/21 194 lb 12.8 oz (88.4 kg)  01/09/21 192 lb (87.1 kg)       Physical Exam Vitals reviewed.  Constitutional:      Appearance: Normal appearance.  HENT:     Head: Normocephalic and atraumatic.     Right Ear: External ear normal.     Left Ear: External ear normal.     Nose: Nose normal.     Mouth/Throat:     Pharynx: Oropharynx is clear.  Eyes:     General: No scleral icterus.    Conjunctiva/sclera: Conjunctivae normal.  Cardiovascular:     Rate and Rhythm: Normal rate and regular rhythm.     Pulses: Normal pulses.     Heart sounds: Normal heart sounds.  Pulmonary:     Effort: Pulmonary effort is normal.     Breath sounds: Normal breath sounds.  Abdominal:     Palpations: Abdomen is soft.  Musculoskeletal:     Right lower leg: No edema.     Left lower leg: No edema.  Skin:    General: Skin is warm and dry.  Neurological:     General: No focal deficit present.     Mental Status: He is alert and oriented to person, place, and time.  Psychiatric:        Mood and Affect: Mood normal.        Behavior: Behavior normal.        Thought Content: Thought content normal.        Judgment: Judgment normal.      No results found for any visits on 06/13/21.  Assessment & Plan     1. Essential hypertension Good control  2. Coronary artery disease involving autologous artery coronary bypass graft without angina pectoris All risk factors treated  3. Chronic atrial fibrillation (HCC) On Eliquis, watch to adjust dose if needed  4. 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 Murmur, cardiac 2/6 systolic murmur at right upper sternal border  6. Pure hypercholesterolemia On atorvastatin 80  7. Neuropathy Work-up has been done with B12 folate offered gabapentin and he declined  8. Anemia, unspecified type Follow-up blood work   No follow-ups on file.      I,  [name], MD, have reviewed all documentation for this visit. The documentation on 06/17/21 for the exam, diagnosis, procedures, and orders are all accurate and complete.  Eldean Klatt Cranford Mon, MD  [facility] [phone] (phone) [phone] (fax)  [place]

## 2021-06-15 ENCOUNTER — Ambulatory Visit (HOSPITAL_COMMUNITY)
Admission: RE | Admit: 2021-06-15 | Discharge: 2021-06-15 | Disposition: A | Discharge status: Home / Self Care | Payer: Medicare Other | Source: Ambulatory Visit | Attending: Internal Medicine | Admitting: Internal Medicine

## 2021-06-15 ENCOUNTER — Other Ambulatory Visit: Payer: Self-pay

## 2021-06-15 VITALS — BP 146/56 | HR 51 | Ht 70.0 in | Wt 194.6 lb

## 2021-06-15 DIAGNOSIS — R600 Localized edema: Secondary | ICD-10-CM | POA: Diagnosis not present

## 2021-06-15 DIAGNOSIS — E875 Hyperkalemia: Secondary | ICD-10-CM | POA: Insufficient documentation

## 2021-06-15 DIAGNOSIS — Z7901 Long term (current) use of anticoagulants: Secondary | ICD-10-CM | POA: Insufficient documentation

## 2021-06-15 DIAGNOSIS — I110 Hypertensive heart disease with heart failure: Secondary | ICD-10-CM | POA: Insufficient documentation

## 2021-06-15 DIAGNOSIS — I2510 Atherosclerotic heart disease of native coronary artery without angina pectoris: Secondary | ICD-10-CM | POA: Diagnosis not present

## 2021-06-15 DIAGNOSIS — I10 Essential (primary) hypertension: Secondary | ICD-10-CM | POA: Diagnosis not present

## 2021-06-15 DIAGNOSIS — I77810 Thoracic aortic ectasia: Secondary | ICD-10-CM | POA: Diagnosis not present

## 2021-06-15 DIAGNOSIS — I6523 Occlusion and stenosis of bilateral carotid arteries: Secondary | ICD-10-CM | POA: Insufficient documentation

## 2021-06-15 DIAGNOSIS — I482 Chronic atrial fibrillation, unspecified: Secondary | ICD-10-CM | POA: Insufficient documentation

## 2021-06-15 DIAGNOSIS — Z951 Presence of aortocoronary bypass graft: Secondary | ICD-10-CM | POA: Diagnosis not present

## 2021-06-15 DIAGNOSIS — R001 Bradycardia, unspecified: Secondary | ICD-10-CM | POA: Insufficient documentation

## 2021-06-15 DIAGNOSIS — Z79899 Other long term (current) drug therapy: Secondary | ICD-10-CM | POA: Insufficient documentation

## 2021-06-15 DIAGNOSIS — G629 Polyneuropathy, unspecified: Secondary | ICD-10-CM | POA: Diagnosis not present

## 2021-06-15 DIAGNOSIS — I6529 Occlusion and stenosis of unspecified carotid artery: Secondary | ICD-10-CM

## 2021-06-15 DIAGNOSIS — G4730 Sleep apnea, unspecified: Secondary | ICD-10-CM | POA: Diagnosis not present

## 2021-06-15 NOTE — Addendum Note (Signed)
Encounter addended by: Shonna Chock, CMA on: 1/99/1444 11:36 AM  Actions taken: Visit diagnoses modified, Actions taken from a BestPractice Advisory, Order list changed, Diagnosis association updated, Clinical Note Signed

## 2021-06-15 NOTE — Progress Notes (Signed)
Advanced Heart Failure Clinic Note   PCP: Dr Rosanna Randy HPI:  Micheal Wright is a 80 y.o. male with a history of coronary artery disease status post coronary artery bypass grafting in 1998.  Last catheterization was in January 2002 due to an abnormal stress test with 4 mm of ST elevation during exercise.  Cardiac catheterization showed patent grafts.  He has a history of hypertension, PAF, hyperlipidemia, and sleep apnea. Myoview 2/09 EF 61% No ischemia.   Had episode PAF in 12/16 during colonoscopy. Underwent DC-CV. Started Eliquis.   Today he returns for HF follow up. Feeling ok but says he feels like he is slowing down a bit. "Can't jump as high or run as fast." Having neuropathy in his feet. No CP or SOB. No bleeding on Eliquis.   Studies:  Sleep Study  04/03/2018 . CPAP Titration completed 04/14/2018   PYP 03/26/2018   H/CL 1.14 Visual grade 2. Equivocal  cMRI 5/19: EF 52% mid myocardial gadolinium uptake on delayed inversion recovery sequences in the septum, apex and inferior base. Reviewed by Drs. Meda Coffee and [place] and not felt c/w amyloid  Echo 03/02/2018  LVEF 55-60% Mild RV dilation. Mild to mod TR. RVSP not elevated on echo. Personally reviewed  Echo 01/2015 EF 60-65%, no AR, Mild MR/TR, PA peak pressure 33 mmHg, GLPSS -21% LA size 3.8cm Carotid US 01/2015 with minimal plaque bilaterally.  Carotid u/s 11/2012  R ok 40-59% (stable).                       3/16: 1-39% bilaterally ABI 12/2009  R 1.0  L 1.1    Review of systems complete and found to be negative unless listed in HPI.    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    Atrial complex, premature 05/04/2014   CAD (coronary artery disease)    s/p CABG 1998; Last cath 2002 in setting of marked ST elevation during stress testing. patent grafts; Echo 8/09 EF 55%. No  significant valvular disease.   Cardiac murmur 09/03/2011   Overview:  Overview:  Last Assessment & Plan:  Likely mild aortic valve sclerosis. Will check echo.     Carotid artery disease ([place])    u/s 9/10: R 40-59% L 0-39%   Carotid artery obstruction 08/22/2009   Overview:  Overview:  Qualifier: Diagnosis of  By: Haroldine Laws, MD, Eileen Stanford  Last Assessment & Plan:  Asymptomatic. Due for repeat u/s.  Overview:  Overview:  Qualifier: Diagnosis of  By: Haroldine Laws, MD, Eileen Stanford  Last Assessment & Plan:  Asymptomatic. Due for repeat u/s.     Carotid stenosis 08/22/2009   Qualifier: Diagnosis of  By: Haroldine Laws, MD, Eileen Stanford    Cerebrovascular disease 11/17/2008   Chest tightness 01/20/2015   CHF (congestive heart failure) ([place])    Chronic atrial fibrillation ([place]) 03/02/2018   Coronary artery disease 05/11/2007   Qualifier: Diagnosis of  By: Lurlean Nanny LPN, Rollene Fare     Erectile dysfunction    Essential hypertension 05/11/2007   Overview:  Overview:  Overview:  Qualifier: Diagnosis of  By: Lurlean Nanny LPN, [place]   Last Assessment & Plan:  BP up slightly here but when he checks [place] [employer] usually 120/65. Will continue current regimen.  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 90 capsule 3   losartan (COZAAR) 50 MG tablet TAKE 1 TABLET BY MOUTH ONCE A DAY 90 tablet 3   NITROSTAT 0.4 MG SL tablet DISSOLVE 1 TABLET UNDER THE TONGUE FOR CHEST PAIN. MAY REPEAT EVERY 5MINUTES UP TO 3 DOSES. IF NO RELIEF, CALL 911** 25 tablet 3   omeprazole (PRILOSEC) 40 MG capsule TAKE 1 CAPSULE BY MOUTH ONCE DAILY 30 capsule 12   No current facility-administered medications for this encounter.   Vitals:   06/15/21 1047  BP: (!) 146/56  Pulse: (!) 51  SpO2: 98%  Weight: 88.3 kg (194 lb 9.6 oz)  Height: 5\' 10"  (1.778 m)   Wt Readings from Last 3 Encounters:  06/15/21 88.3 kg (194 lb 9.6 oz)  06/13/21 88.4 kg (194 lb 12.8 oz)  01/09/21 87.1 kg (192 lb)    PHYSICAL EXAM: General:  Well appearing. No resp difficulty HEENT: normal Neck: supple. no JVD. Carotids 2+ bilat; no bruits. No lymphadenopathy or thryomegaly appreciated. Cor: PMI nondisplaced. Loletha Grayer. irregular rate & rhythm. No rubs, gallops or murmurs. Lungs: clear Abdomen: soft, nontender, nondistended. No hepatosplenomegaly. No bruits or masses. Good bowel sounds. Extremities: no cyanosis, clubbing, rash, edema Neuro: alert & orientedx3, cranial nerves grossly intact. moves all 4 extremities w/o difficulty. Affect pleasant  ASSESSMENT & PLAN:  1) LE edema - Resolved with addition of HCTZ - likely mixed picture between dependent edema (with LE varicosities) but also has some RV strain on echo and MRI  - unable to use spiro due to hyperkalemia.  -PYP equivocal for TTR amyloidosis Grade 2. Quanitive 1.14 - cMRI 5/19: LVEF 52% mid myocardial gadolinium uptake on delayed inversion recovery sequences in the septum, apex and inferior base. RV 28%  Reviewed by Drs. Meda Coffee and [place] and not felt c/w amyloid  - Overall doing very but I am still not completely convinced that he doesn't have TTR amyloid. Repeat PYP 2) Chronic a fib.  - Rate slow On Eliquis. No bleeding   - CHADS VASC 4 (age =2. CAD, HTN) - Will track heat rate with FitBit.  3) CAD, s/p CABG 1998 - No s/s of ischemia - Myoview 3/16 OK.  4) HTN - Blood pressure well controlled. Continue current regimen. 5) HL - Managed by Dr. Rosanna Randy.  6) Bradycardia - BB previously stopped. Stable. No need for PM at this point 7) Carotid stenosis, mild - US Carotid 05/28/2017 Bilateral 1-39%. No need for f/u testing at this point 8) Sleep Apnea - Completed sleep study 5/32019 Mild OSA AHI 6.1 - Has stopped using CPAP. Snoring improved with 10 pound weight loss.  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

## 2021-06-15 NOTE — Patient Instructions (Signed)
No Labs done today.  No medication changes were made. Please continue all current medications as prescribed.  Your provider has requested that you have a PYP scan done. This has to be approved through your insurance company prior to scheduling, once approved we will contact you to schedule an appointment.   Your physician recommends that you schedule a follow-up appointment soon for an echo and in 1 year for an appointment with Dr. Haroldine Laws. Please contact our office in June 2023 for a July 2023 appointment.   Your physician has requested that you have an echocardiogram. Echocardiography is a painless test that uses sound waves to create images of your heart. It provides your doctor with information about the size and shape of your heart and how well your heart's chambers and valves are working. This procedure takes approximately one hour. There are no restrictions for this procedure.  If you have any questions or concerns before your next appointment please send [place] a message through [place] or call our office at [phone].    TO LEAVE A MESSAGE FOR THE NURSE SELECT OPTION 2, PLEASE LEAVE A MESSAGE INCLUDING: YOUR NAME DATE OF BIRTH CALL BACK NUMBER REASON FOR CALL**this is important as we prioritize the call backs  YOU WILL RECEIVE A CALL BACK THE SAME DAY AS LONG AS YOU CALL BEFORE 4:00 PM   Do the following things EVERYDAY: Weigh yourself in the morning before breakfast. Write it down and keep it in a log. Take your medicines as prescribed Eat low salt foods--Limit salt (sodium) to 2000 mg per day.  Stay as active as you can everyday Limit all fluids for the day to less than 2 liters   At the [facility], you and your health needs are our priority. As part of our continuing mission to provide you with exceptional heart care, we have created designated Provider Care Teams. These Care Teams include your primary Cardiologist (physician) and Advanced Practice  Providers (APPs- Physician Assistants and Nurse Practitioners) who all work together to provide you with the care you need, when you need it.   You may see any of the following providers on your designated Care Team at your next follow up: Dr [name] Dr Haynes Kerns, NP Lyda Jester, [place] Audry Riles, PharmD   Please be sure to bring in all your medications bottles to every appointment.

## 2021-06-28 ENCOUNTER — Other Ambulatory Visit (HOSPITAL_COMMUNITY): Payer: Self-pay | Admitting: Internal Medicine

## 2021-07-02 ENCOUNTER — Other Ambulatory Visit: Payer: Self-pay

## 2021-07-02 ENCOUNTER — Ambulatory Visit (HOSPITAL_COMMUNITY)
Admission: RE | Admit: 2021-07-02 | Discharge: 2021-07-02 | Disposition: A | Discharge status: Home / Self Care | Payer: Medicare Other | Source: Ambulatory Visit | Attending: Internal Medicine | Admitting: Internal Medicine

## 2021-07-02 DIAGNOSIS — I509 Heart failure, unspecified: Secondary | ICD-10-CM | POA: Diagnosis not present

## 2021-07-02 DIAGNOSIS — I482 Chronic atrial fibrillation, unspecified: Secondary | ICD-10-CM | POA: Insufficient documentation

## 2021-07-02 DIAGNOSIS — E785 Hyperlipidemia, unspecified: Secondary | ICD-10-CM | POA: Insufficient documentation

## 2021-07-02 DIAGNOSIS — I110 Hypertensive heart disease with heart failure: Secondary | ICD-10-CM | POA: Diagnosis not present

## 2021-07-02 NOTE — Progress Notes (Signed)
  Echocardiogram 2D Echocardiogram has been performed.  Fidel Levy 07/02/2021, 1:28 PM

## 2021-07-12 DIAGNOSIS — H9313 Tinnitus, bilateral: Secondary | ICD-10-CM | POA: Diagnosis not present

## 2021-07-12 DIAGNOSIS — H6123 Impacted cerumen, bilateral: Secondary | ICD-10-CM | POA: Diagnosis not present

## 2021-07-12 DIAGNOSIS — H903 Sensorineural hearing loss, bilateral: Secondary | ICD-10-CM | POA: Diagnosis not present

## 2021-07-17 ENCOUNTER — Other Ambulatory Visit (HOSPITAL_COMMUNITY): Payer: Self-pay | Admitting: Internal Medicine

## 2021-07-17 DIAGNOSIS — I2510 Atherosclerotic heart disease of native coronary artery without angina pectoris: Secondary | ICD-10-CM

## 2021-07-26 DIAGNOSIS — D2261 Melanocytic nevi of right upper limb, including shoulder: Secondary | ICD-10-CM | POA: Diagnosis not present

## 2021-07-26 DIAGNOSIS — Z08 Encounter for follow-up examination after completed treatment for malignant neoplasm: Secondary | ICD-10-CM | POA: Diagnosis not present

## 2021-07-26 DIAGNOSIS — L821 Other seborrheic keratosis: Secondary | ICD-10-CM | POA: Diagnosis not present

## 2021-07-26 DIAGNOSIS — Z85828 Personal history of other malignant neoplasm of skin: Secondary | ICD-10-CM | POA: Diagnosis not present

## 2021-07-26 DIAGNOSIS — D2262 Melanocytic nevi of left upper limb, including shoulder: Secondary | ICD-10-CM | POA: Diagnosis not present

## 2021-07-26 DIAGNOSIS — L570 Actinic keratosis: Secondary | ICD-10-CM | POA: Diagnosis not present

## 2021-07-26 DIAGNOSIS — D2271 Melanocytic nevi of right lower limb, including hip: Secondary | ICD-10-CM | POA: Diagnosis not present

## 2021-07-26 DIAGNOSIS — D225 Melanocytic nevi of trunk: Secondary | ICD-10-CM | POA: Diagnosis not present

## 2021-07-26 DIAGNOSIS — X32XXXA Exposure to sunlight, initial encounter: Secondary | ICD-10-CM | POA: Diagnosis not present

## 2021-07-26 DIAGNOSIS — D2272 Melanocytic nevi of left lower limb, including hip: Secondary | ICD-10-CM | POA: Diagnosis not present

## 2021-08-13 ENCOUNTER — Telehealth (HOSPITAL_COMMUNITY): Payer: Self-pay | Admitting: *Deleted

## 2021-08-13 NOTE — Telephone Encounter (Signed)
Pt called to report fit bit heart rate readings per Dr.Bensimhons request. . Avg resting heart rate 46bpm. Pt said during the day his heart rate ranges 70s-90s. During his sleep it runs in the 41s. Pt asked that I forward this to DR.Bensimhon.

## 2021-08-14 NOTE — Telephone Encounter (Signed)
Pt aware and agreeable with plan.  

## 2021-08-15 ENCOUNTER — Other Ambulatory Visit (HOSPITAL_COMMUNITY): Payer: Self-pay | Admitting: Internal Medicine

## 2021-09-05 ENCOUNTER — Other Ambulatory Visit: Payer: Self-pay

## 2021-09-05 ENCOUNTER — Ambulatory Visit (INDEPENDENT_AMBULATORY_CARE_PROVIDER_SITE_OTHER): Payer: Medicare Other

## 2021-09-05 DIAGNOSIS — Z23 Encounter for immunization: Secondary | ICD-10-CM | POA: Diagnosis not present

## 2021-10-03 DIAGNOSIS — M25562 Pain in left knee: Secondary | ICD-10-CM | POA: Diagnosis not present

## 2021-10-15 ENCOUNTER — Other Ambulatory Visit: Payer: Self-pay | Admitting: Family Medicine

## 2021-10-15 NOTE — Telephone Encounter (Signed)
Requested Prescriptions  Pending Prescriptions Disp Refills  . omeprazole (PRILOSEC) 40 MG capsule [Pharmacy Med Name: OMEPRAZOLE DR 40 MG CAP] 90 capsule 0    Sig: TAKE 1 CAPSULE BY MOUTH ONCE DAILY     Gastroenterology: Proton Pump Inhibitors Passed - 10/15/2021 10:50 AM      Passed - Valid encounter within last 12 months    Recent Outpatient Visits          4 months ago Essential hypertension   [facility] Jerrol Banana., MD   12 months ago Essential hypertension   [facility] Jerrol Banana., MD   1 year ago Essential hypertension   [facility] Jerrol Banana., MD   1 year ago Weight loss   [facility]   2 years ago Weight loss   [facility] Jerrol Banana., MD      Future Appointments            In 2 months Jerrol Banana., MD [facility], [place]

## 2021-12-17 ENCOUNTER — Ambulatory Visit: Payer: Self-pay | Admitting: Family Medicine

## 2022-01-03 DIAGNOSIS — D485 Neoplasm of uncertain behavior of skin: Secondary | ICD-10-CM | POA: Diagnosis not present

## 2022-01-03 DIAGNOSIS — L820 Inflamed seborrheic keratosis: Secondary | ICD-10-CM | POA: Diagnosis not present

## 2022-01-08 ENCOUNTER — Encounter (INDEPENDENT_AMBULATORY_CARE_PROVIDER_SITE_OTHER): Payer: Self-pay | Admitting: Vascular Surgery

## 2022-01-08 ENCOUNTER — Ambulatory Visit (INDEPENDENT_AMBULATORY_CARE_PROVIDER_SITE_OTHER): Payer: Medicare Other | Admitting: Vascular Surgery

## 2022-01-08 ENCOUNTER — Ambulatory Visit (INDEPENDENT_AMBULATORY_CARE_PROVIDER_SITE_OTHER): Payer: Medicare Other

## 2022-01-08 ENCOUNTER — Other Ambulatory Visit: Payer: Self-pay

## 2022-01-08 VITALS — BP 143/75 | HR 63 | Ht 69.0 in | Wt 194.0 lb

## 2022-01-08 DIAGNOSIS — I6529 Occlusion and stenosis of unspecified carotid artery: Secondary | ICD-10-CM

## 2022-01-08 DIAGNOSIS — R42 Dizziness and giddiness: Secondary | ICD-10-CM

## 2022-01-08 DIAGNOSIS — I10 Essential (primary) hypertension: Secondary | ICD-10-CM

## 2022-01-08 DIAGNOSIS — I7143 Infrarenal abdominal aortic aneurysm, without rupture: Secondary | ICD-10-CM | POA: Diagnosis not present

## 2022-01-08 DIAGNOSIS — E7800 Pure hypercholesterolemia, unspecified: Secondary | ICD-10-CM

## 2022-01-08 DIAGNOSIS — I714 Abdominal aortic aneurysm, without rupture, unspecified: Secondary | ICD-10-CM | POA: Insufficient documentation

## 2022-01-08 NOTE — Assessment & Plan Note (Signed)
Has not been checked in about 2 years.  Was a small aneurysm at that time, but still should be checked at some point in the near future at his convenience.

## 2022-01-08 NOTE — Assessment & Plan Note (Signed)
Carotid duplex shows 1 to 39% ICA stenosis bilaterally without significant progression from previous studies.  Check in 1 year.  No change in medical regimen.

## 2022-01-08 NOTE — Progress Notes (Signed)
MRN : [id number]  Micheal Wright is a 81 y.o. (10/04/1941) male who presents with chief complaint of  Chief Complaint  Patient presents with   Follow-up    1 yr carotid  .  History of Present Illness: Patient returns in follow-up for his annual carotid duplex.  He is doing well today.  He denies any focal neurologic symptoms.  He has not really had any further dizziness that prompted his initial evaluation previously.  No stroke or TIA symptoms.  Carotid duplex shows 1 to 39% ICA stenosis bilaterally without significant progression from previous studies. He does have a small aneurysm that was 3.1 cm at last check but has not been checked in about 2 years.  Current Outpatient Medications  Medication Sig Dispense Refill   amLODipine (NORVASC) 5 MG tablet TAKE 1 TABLET BY MOUTH ONCE A DAY 90 tablet 4   atorvastatin (LIPITOR) 80 MG tablet TAKE 1 TABLET BY MOUTH DAILY 90 tablet 3   ELIQUIS 5 MG TABS tablet TAKE 1 TABLET BY MOUTH TWICE A DAY 180 tablet 3   hydrochlorothiazide (MICROZIDE) 12.5 MG capsule TAKE 1 CAPSULE BY MOUTH ONCE DAILY 90 capsule 3   losartan (COZAAR) 50 MG tablet TAKE 1 TABLET BY MOUTH ONCE A DAY 90 tablet 3   NITROSTAT 0.4 MG SL tablet DISSOLVE 1 TABLET UNDER THE TONGUE FOR CHEST PAIN. MAY REPEAT EVERY 5MINUTES UP TO 3 DOSES. IF NO RELIEF, CALL 911** 25 tablet 3   omeprazole (PRILOSEC) 40 MG capsule TAKE 1 CAPSULE BY MOUTH ONCE DAILY 90 capsule 0   No current facility-administered medications for this visit.    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    Atrial complex, premature 05/04/2014   CAD (coronary artery disease)    s/p CABG 1998; Last cath 2002 in setting of marked ST elevation during stress testing. patent grafts; Echo 8/09 EF 55%. No significant valvular disease.   Cardiac murmur 09/03/2011    Overview:  Overview:  Last Assessment & Plan:  Likely mild aortic valve sclerosis. Will check echo.     Carotid artery disease ([place])    u/s 9/10: R 40-59% L 0-39%   Carotid artery obstruction 08/22/2009   Overview:  Overview:  Qualifier: Diagnosis of  By: Haroldine Laws, MD, Eileen Stanford  Last Assessment & Plan:  Asymptomatic. Due for repeat u/s.  Overview:  Overview:  Qualifier: Diagnosis of  By: Haroldine Laws, MD, Eileen Stanford  Last Assessment & Plan:  Asymptomatic. Due for repeat u/s.     Carotid stenosis 08/22/2009   Qualifier: Diagnosis of  By: Haroldine Laws, MD, Eileen Stanford    Cerebrovascular disease 11/17/2008   Chest tightness 01/20/2015   CHF (congestive heart failure) ([place])    Chronic atrial fibrillation ([place]) 03/02/2018   Coronary artery disease 05/11/2007   Qualifier: Diagnosis of  By: Lurlean Nanny LPN, Rollene Fare     Erectile dysfunction    Essential hypertension 05/11/2007   Overview:  Overview:  Overview:  Qualifier: Diagnosis of  By: Lurlean Nanny LPN, [place]   Last Assessment & Plan:  BP up slightly here but when he checks [employer] usually 120/65. Will continue current regimen.  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   Sleep apnea     Past Surgical History:  Procedure Laterality Date   ANGIOPLASTY     x 2   APPENDECTOMY  1960's   CARDIAC CATHETERIZATION  2002   CARDIOVERSION N/A 11/14/2015   Procedure: CARDIOVERSION;  Surgeon: Jolaine Artist, MD;  Location: [facility];  Service: Cardiovascular;  Laterality: N/A;   COLONOSCOPY W/ POLYPECTOMY     COLONOSCOPY WITH PROPOFOL N/A 11/13/2015   Procedure: COLONOSCOPY WITH  PROPOFOL;  Surgeon: Manya Silvas, MD;  Location: [facility];  Service: Endoscopy;  Laterality: N/A;   COLONOSCOPY WITH PROPOFOL N/A 02/01/2019   Procedure: COLONOSCOPY WITH PROPOFOL;  Surgeon: Manya Silvas, MD;  Location: [facility] ENDOSCOPY;  Service: Endoscopy;  Laterality: N/A;   CORONARY ARTERY BYPASS GRAFT  1998   ESOPHAGOGASTRODUODENOSCOPY  04/2004   Inflam. at Z line (biopsy neg)   ESOPHAGOGASTRODUODENOSCOPY N/A 02/01/2019   Procedure: ESOPHAGOGASTRODUODENOSCOPY (EGD);  Surgeon: Manya Silvas, MD;  Location: [facility] ENDOSCOPY;  Service: Endoscopy;  Laterality: N/A;   HERPES SIMPLEX VIRUS DFA N/A    KNEE ARTHROSCOPY W/ PARTIAL MEDIAL MENISCECTOMY  1990s   Left knee   MRI     sleep study     UVULOPALATOPHARYNGOPLASTY       Social History   Tobacco Use   Smoking status: Former   Smokeless tobacco: Never   Tobacco comments:    quit around age 60-20, did not inhale  Vaping Use   Vaping Use: Never used  Substance Use Topics   Alcohol use: No   Drug use: No      Family History  Problem Relation Age of Onset   Stroke Mother    Heart attack Father    Alcohol abuse Father    Breast cancer Sister        89   Heart disease Sister    Rheumatologic disease Brother        Heart   Heart disease Brother    Breast cancer Maternal Aunt        dx 85s d. 75s   Colon cancer Paternal Uncle        dx 41s   Colon polyps Son        5-6   Hypertension Neg Hx    Diabetes Neg Hx    Prostate cancer Neg Hx      No Known Allergies   REVIEW OF SYSTEMS (Negative unless checked)   Constitutional: [] Weight loss  [] Fever  [] Chills Cardiac: [] Chest pain   [] Chest pressure   [] Palpitations   [] Shortness of breath when laying flat   [] Shortness of breath at rest   [] Shortness of breath with exertion. Vascular:  [] Pain in legs with walking   [] Pain in legs at rest   [] Pain in legs when laying flat   [] Claudication   [] Pain in feet when walking  [] Pain in feet at rest  [] Pain in feet  when laying flat   [] History of DVT   [] Phlebitis   [] Swelling in legs   [] Varicose veins   [] Non-healing ulcers Pulmonary:   [] Uses home oxygen   [] Productive cough   [] Hemoptysis   [] Wheeze  [] COPD   [] Asthma Neurologic:  [x] Dizziness  [x] Blackouts   []   Seizures   [] History of stroke   [] History of TIA  [] Aphasia   [] Temporary blindness   [] Dysphagia   [] Weakness or numbness in arms   [] Weakness or numbness in legs Musculoskeletal:  [x] Arthritis   [] Joint swelling   [x] Joint pain   [] Low back pain Hematologic:  [] Easy bruising  [] Easy bleeding   [] Hypercoagulable state   [] Anemic  [] Hepatitis Gastrointestinal:  [] Blood in stool   [] Vomiting blood  [] Gastroesophageal reflux/heartburn   [] Abdominal pain Genitourinary:  [] Chronic kidney disease   [] Difficult urination  [] Frequent urination  [] Burning with urination   [] Hematuria Skin:  [] Rashes   [] Ulcers   [] Wounds Psychological:  [] History of anxiety   []  History of major depression.  Physical Examination  Vitals:   01/08/22 1102  BP: (!) 143/75  Pulse: 63  Weight: 194 lb (88 kg)  Height: 5\' 9"  (1.753 m)   Body mass index is 28.65 kg/m. Gen:  WD/WN, NAD.  Appears younger than stated age Head: [place]/AT, No temporalis wasting. Ear/Nose/Throat: Hearing grossly intact, nares w/o erythema or drainage, trachea midline Eyes: Conjunctiva clear. Sclera non-icteric Neck: Supple.  No bruit  Pulmonary:  Good air movement, equal and clear to auscultation bilaterally.  Cardiac: RRR, No JVD Vascular:  Vessel Right Left  Radial Palpable Palpable           Musculoskeletal: M/S 5/5 throughout.  No deformity or atrophy.  No significant lower extremity edema. Neurologic: CN 2-12 intact. Sensation grossly intact in extremities.  Symmetrical.  Speech is fluent. Motor exam as listed above. Psychiatric: Judgment intact, Mood & affect appropriate for pt's clinical situation. Dermatologic: No rashes or ulcers noted.  No cellulitis or open  wounds.     CBC Lab Results  Component Value Date   WBC 5.6 06/07/2021   HGB 13.5 06/07/2021   HCT 39.6 06/07/2021   MCV 93 06/07/2021   PLT 261 06/07/2021    BMET    Component Value Date/Time   NA 138 06/07/2021 0922   K 4.9 06/07/2021 0922   CL 100 06/07/2021 0922   CO2 26 06/07/2021 0922   GLUCOSE 100 (H) 06/07/2021 0922   GLUCOSE 101 (H) 06/01/2020 1306   BUN 11 06/07/2021 0922   CREATININE 1.24 06/07/2021 0922   CALCIUM 9.8 06/07/2021 0922   GFRNONAA 66 06/13/2020 1037   GFRAA 76 06/13/2020 1037   CrCl cannot be calculated (Patient's most recent lab result is older than the maximum 21 days allowed.).  COAG Lab Results  Component Value Date   INR 1.31 11/14/2015    Radiology No results found.   Assessment/Plan Essential (primary) hypertension blood pressure control important in reducing the progression of atherosclerotic disease. On appropriate oral medications.     Hyperlipidemia lipid control important in reducing the progression of atherosclerotic disease. Continue statin therapy     Dizziness and giddiness Not entirely clear of the cause of his dizziness and presyncope, but his mild carotid disease would not be expected to cause this.  Carotid stenosis Carotid duplex shows 1 to 39% ICA stenosis bilaterally without significant progression from previous studies.  Check in 1 year.  No change in medical regimen.  AAA (abdominal aortic aneurysm) without rupture Has not been checked in about 2 years.  Was a small aneurysm at that time, but still should be checked at some point in the near future at his convenience.    Leotis Pain, MD  01/08/2022 12:36 PM    This note was created with Dragon medical transcription system.  Any errors from dictation are purely unintentional

## 2022-01-12 ENCOUNTER — Other Ambulatory Visit: Payer: Self-pay | Admitting: Family Medicine

## 2022-01-14 NOTE — Telephone Encounter (Signed)
Requested Prescriptions  Pending Prescriptions Disp Refills   omeprazole (PRILOSEC) 40 MG capsule [Pharmacy Med Name: OMEPRAZOLE DR 40 MG CAP] 90 capsule 0    Sig: TAKE 1 CAPSULE BY MOUTH ONCE DAILY     Gastroenterology: Proton Pump Inhibitors Passed - 01/12/2022  9:13 AM      Passed - Valid encounter within last 12 months    Recent Outpatient Visits          7 months ago Essential hypertension   [facility] Jerrol Banana., MD   1 year ago Essential hypertension   [facility] Jerrol Banana., MD   1 year ago Essential hypertension   [facility] Jerrol Banana., MD   2 years ago Weight loss   [facility] Jerrol Banana., MD   2 years ago Weight loss   [facility] Jerrol Banana., MD      Future Appointments            In 3 weeks Jerrol Banana., MD [facility], [place]

## 2022-01-23 ENCOUNTER — Other Ambulatory Visit: Payer: Self-pay

## 2022-01-23 ENCOUNTER — Ambulatory Visit (INDEPENDENT_AMBULATORY_CARE_PROVIDER_SITE_OTHER): Payer: Medicare Other

## 2022-01-23 DIAGNOSIS — I7143 Infrarenal abdominal aortic aneurysm, without rupture: Secondary | ICD-10-CM

## 2022-02-05 ENCOUNTER — Ambulatory Visit (INDEPENDENT_AMBULATORY_CARE_PROVIDER_SITE_OTHER): Payer: Medicare Other | Admitting: Family Medicine

## 2022-02-05 ENCOUNTER — Other Ambulatory Visit: Payer: Self-pay

## 2022-02-05 ENCOUNTER — Encounter: Payer: Self-pay | Admitting: Family Medicine

## 2022-02-05 VITALS — BP 114/73 | HR 87 | Temp 98.3°F | Resp 16 | Wt 195.0 lb

## 2022-02-05 DIAGNOSIS — I10 Essential (primary) hypertension: Secondary | ICD-10-CM | POA: Diagnosis not present

## 2022-02-05 DIAGNOSIS — I482 Chronic atrial fibrillation, unspecified: Secondary | ICD-10-CM | POA: Diagnosis not present

## 2022-02-05 DIAGNOSIS — I7143 Infrarenal abdominal aortic aneurysm, without rupture: Secondary | ICD-10-CM | POA: Diagnosis not present

## 2022-02-05 DIAGNOSIS — G8929 Other chronic pain: Secondary | ICD-10-CM | POA: Diagnosis not present

## 2022-02-05 DIAGNOSIS — I77810 Thoracic aortic ectasia: Secondary | ICD-10-CM

## 2022-02-05 DIAGNOSIS — I25810 Atherosclerosis of coronary artery bypass graft(s) without angina pectoris: Secondary | ICD-10-CM | POA: Diagnosis not present

## 2022-02-05 DIAGNOSIS — K219 Gastro-esophageal reflux disease without esophagitis: Secondary | ICD-10-CM | POA: Diagnosis not present

## 2022-02-05 DIAGNOSIS — I6529 Occlusion and stenosis of unspecified carotid artery: Secondary | ICD-10-CM | POA: Diagnosis not present

## 2022-02-05 DIAGNOSIS — Z86010 Personal history of colonic polyps: Secondary | ICD-10-CM | POA: Diagnosis not present

## 2022-02-05 DIAGNOSIS — M25562 Pain in left knee: Secondary | ICD-10-CM

## 2022-02-05 NOTE — Progress Notes (Signed)
?  ? ? ?Established patient visit ? ?I,[name],acting as a scribe for [name], MD.,have documented all relevant documentation on the behalf of [name], MD,as directed by  [name], MD while in the presence of [name], MD. ? ? ?Patient: Micheal Wright   DOB: 1941-03-02   81 y.o. Male  MRN: [id number] ?Visit Date: 02/05/2022 ? ?Today's healthcare provider: [name], MD  ? ?Chief Complaint  ?Patient presents with  ? Follow-up  ? Hypertension  ? Atrial Fibrillation  ? ?Subjective  ?  ?HPI  ?Patient comes in today for follow-up.  Overall he is feeling fairly well.  He continues to work on being active. ?He has been followed by orthopedics for chronic knee pain is trying to remain active. ?He has chronic vascular disease which has been treated aggressively and carotid stenosis followed by vascular surgery ?Hypertension, follow-up ? ?BP Readings from Last 3 Encounters:  ?02/05/22 114/73  ?01/08/22 (!) 143/75  ?06/15/21 (!) 146/56  ? Wt Readings from Last 3 Encounters:  ?02/05/22 195 lb (88.5 kg)  ?01/08/22 194 lb (88 kg)  ?06/15/21 194 lb 9.6 oz (88.3 kg)  ?  ? ?He was last seen for hypertension 8 months ago.  ?BP at that visit was 114/70. ?Management since that visit includes; Good control. ?He reports good compliance with treatment. ?He is not having side effects. none ?He is exercising. ?He is adherent to low salt diet.   ?Outside blood pressures are not checking. ? ?He does not smoke. ? ?Use of agents associated with hypertension: none.  ? ?--------------------------------------------------------------------------------------------------- ?Follow up for Chronic atrial fibrillation [facility]): ? ?The patient was last seen for this 8 months ago. ?Changes made at last visit include; On Eliquis, watch to adjust dose if needed. ? ?He reports good compliance with treatment. ?He feels that condition is Unchanged. ?He is not having side effects.  none ? ?----------------------------------------------------------------------------------------- ? ? ?Medications: ?Outpatient Medications Prior to Visit  ?Medication Sig  ? amLODipine (NORVASC) 5 MG tablet TAKE 1 TABLET BY MOUTH ONCE A DAY  ? atorvastatin (LIPITOR) 80 MG tablet TAKE 1 TABLET BY MOUTH DAILY  ? ELIQUIS 5 MG TABS tablet TAKE 1 TABLET BY MOUTH TWICE A DAY  ? hydrochlorothiazide (MICROZIDE) 12.5 MG capsule TAKE 1 CAPSULE BY MOUTH ONCE DAILY  ? losartan (COZAAR) 50 MG tablet TAKE 1 TABLET BY MOUTH ONCE A DAY  ? NITROSTAT 0.4 MG SL tablet DISSOLVE 1 TABLET UNDER THE TONGUE FOR CHEST PAIN. MAY REPEAT EVERY 5MINUTES UP TO 3 DOSES. IF NO RELIEF, CALL 911**  ? omeprazole (PRILOSEC) 40 MG capsule TAKE 1 CAPSULE BY MOUTH ONCE DAILY  ? ?No facility-administered medications prior to visit.  ? ? ?Review of Systems  ?Constitutional:  Negative for appetite change, chills and fever.  ?Respiratory:  Negative for chest tightness, shortness of breath and wheezing.   ?Cardiovascular:  Negative for chest pain and palpitations.  ?Gastrointestinal:  Negative for abdominal pain, nausea and vomiting.  ? ?Last lipids ?Lab Results  ?Component Value Date  ? CHOL 100 06/07/2021  ? HDL 33 (L) 06/07/2021  ? LDLCALC 51 06/07/2021  ? TRIG 80 06/07/2021  ? CHOLHDL 3.0 06/07/2021  ? ?  ?  Objective  ?  ?BP 114/73 (BP Location: Left Arm, Patient Position: Sitting, Cuff Size: Large)   Pulse 87   Temp 98.3 ?F (36.8 ?C) (Temporal)   Resp 16   Wt 195 lb (88.5 kg)   SpO2 97%  BMI 28.80 kg/m?  ?BP Readings from Last 3 Encounters:  ?02/05/22 114/73  ?01/08/22 (!) 143/75  ?06/15/21 (!) 146/56  ? ?Wt Readings from Last 3 Encounters:  ?02/05/22 195 lb (88.5 kg)  ?01/08/22 194 lb (88 kg)  ?06/15/21 194 lb 9.6 oz (88.3 kg)  ? ?  ? ?Physical Exam ?Vitals reviewed.  ?Constitutional:   ?   Appearance: Normal appearance.  ?HENT:  ?   Head: Normocephalic and atraumatic.  ?   Right Ear: External ear normal.  ?   Left Ear: External ear normal.  ?    Nose: Nose normal.  ?   Mouth/Throat:  ?   Pharynx: Oropharynx is clear.  ?Eyes:  ?   General: No scleral icterus. ?   Conjunctiva/sclera: Conjunctivae normal.  ?Cardiovascular:  ?   Rate and Rhythm: Normal rate and regular rhythm.  ?   Pulses: Normal pulses.  ?   Heart sounds: Normal heart sounds.  ?Pulmonary:  ?   Effort: Pulmonary effort is normal.  ?   Breath sounds: Normal breath sounds.  ?Abdominal:  ?   Palpations: Abdomen is soft.  ?Musculoskeletal:  ?   Right lower leg: No edema.  ?   Left lower leg: No edema.  ?Skin: ?   General: Skin is warm and dry.  ?Neurological:  ?   General: No focal deficit present.  ?   Mental Status: He is alert and oriented to person, place, and time.  ?Psychiatric:     ?   Mood and Affect: Mood normal.     ?   Behavior: Behavior normal.     ?   Thought Content: Thought content normal.     ?   Judgment: Judgment normal.  ?  ? ? ?No results found for any visits on 02/05/22. ? Assessment & Plan  ?  ? ?1. Essential hypertension ?Good control medication ? ?2. Coronary artery disease involving autologous artery coronary bypass graft without angina pectoris ?All risk factors treated  ? ?3. Chronic atrial fibrillation (HCC) ?On Eliquis ? ?4. Stenosis of carotid artery, unspecified laterality ?Followed by vascular ? ?5. Ascending aorta dilation (HCC) ?Followed by vascular ? ?6. Infrarenal abdominal aortic aneurysm (AAA) without rupture ? ? ?7. Gastro-esophageal reflux disease without esophagitis ?Back to GI as patient states he smokes to see GI for follow-up of reflux and colonic polyps. ?For to GI if 81 year old needs this follow-up at this time ?- Ambulatory referral to Gastroenterology ? ?8. COLONIC POLYPS, HX OF ? ?- Ambulatory referral to Gastroenterology ?9.  Chronic left knee pain ?Per orthopedic ?No follow-ups on file.  ?   ? ?I, [name], MD, have reviewed all documentation for this visit. The documentation on 02/09/22 for the exam, diagnosis, procedures, and orders  are all accurate and complete. ? ? ? ?Nyshawn Gowdy Cranford Mon, MD  ?[facility] ?[phone] (phone) ?[phone] (fax) ? ?[facility] ?

## 2022-02-13 DIAGNOSIS — H6063 Unspecified chronic otitis externa, bilateral: Secondary | ICD-10-CM | POA: Diagnosis not present

## 2022-02-13 DIAGNOSIS — H6123 Impacted cerumen, bilateral: Secondary | ICD-10-CM | POA: Diagnosis not present

## 2022-02-13 DIAGNOSIS — H903 Sensorineural hearing loss, bilateral: Secondary | ICD-10-CM | POA: Diagnosis not present

## 2022-02-28 DIAGNOSIS — S80861A Insect bite (nonvenomous), right lower leg, initial encounter: Secondary | ICD-10-CM | POA: Diagnosis not present

## 2022-04-04 DIAGNOSIS — H2513 Age-related nuclear cataract, bilateral: Secondary | ICD-10-CM | POA: Diagnosis not present

## 2022-04-12 ENCOUNTER — Other Ambulatory Visit: Payer: Self-pay | Admitting: Family Medicine

## 2022-04-16 DIAGNOSIS — E538 Deficiency of other specified B group vitamins: Secondary | ICD-10-CM | POA: Diagnosis not present

## 2022-04-16 DIAGNOSIS — H2512 Age-related nuclear cataract, left eye: Secondary | ICD-10-CM | POA: Diagnosis not present

## 2022-04-16 DIAGNOSIS — K219 Gastro-esophageal reflux disease without esophagitis: Secondary | ICD-10-CM | POA: Diagnosis not present

## 2022-04-16 DIAGNOSIS — I4891 Unspecified atrial fibrillation: Secondary | ICD-10-CM | POA: Diagnosis not present

## 2022-04-18 ENCOUNTER — Encounter: Payer: Self-pay | Admitting: Ophthalmology

## 2022-04-19 DIAGNOSIS — S30863A Insect bite (nonvenomous) of scrotum and testes, initial encounter: Secondary | ICD-10-CM | POA: Diagnosis not present

## 2022-04-19 DIAGNOSIS — T07XXXA Unspecified multiple injuries, initial encounter: Secondary | ICD-10-CM | POA: Diagnosis not present

## 2022-04-19 DIAGNOSIS — S30862A Insect bite (nonvenomous) of penis, initial encounter: Secondary | ICD-10-CM | POA: Diagnosis not present

## 2022-04-24 NOTE — Discharge Instructions (Signed)

## 2022-04-30 ENCOUNTER — Ambulatory Visit
Admission: RE | Admit: 2022-04-30 | Discharge: 2022-04-30 | Disposition: A | Discharge status: Home / Self Care | Payer: Medicare Other | Attending: Ophthalmology | Admitting: Ophthalmology

## 2022-04-30 ENCOUNTER — Encounter: Payer: Self-pay | Admitting: Ophthalmology

## 2022-04-30 ENCOUNTER — Ambulatory Visit: Payer: Medicare Other | Admitting: Anesthesiology

## 2022-04-30 ENCOUNTER — Encounter
Admission: RE | Disposition: A | Discharge status: Home / Self Care | Payer: Self-pay | Source: Home / Self Care | Attending: Ophthalmology

## 2022-04-30 ENCOUNTER — Other Ambulatory Visit: Payer: Self-pay

## 2022-04-30 DIAGNOSIS — I2510 Atherosclerotic heart disease of native coronary artery without angina pectoris: Secondary | ICD-10-CM | POA: Diagnosis not present

## 2022-04-30 DIAGNOSIS — Z87891 Personal history of nicotine dependence: Secondary | ICD-10-CM | POA: Diagnosis not present

## 2022-04-30 DIAGNOSIS — Z79899 Other long term (current) drug therapy: Secondary | ICD-10-CM | POA: Insufficient documentation

## 2022-04-30 DIAGNOSIS — G4730 Sleep apnea, unspecified: Secondary | ICD-10-CM | POA: Insufficient documentation

## 2022-04-30 DIAGNOSIS — H2512 Age-related nuclear cataract, left eye: Secondary | ICD-10-CM | POA: Diagnosis not present

## 2022-04-30 DIAGNOSIS — Z951 Presence of aortocoronary bypass graft: Secondary | ICD-10-CM | POA: Diagnosis not present

## 2022-04-30 DIAGNOSIS — K219 Gastro-esophageal reflux disease without esophagitis: Secondary | ICD-10-CM | POA: Insufficient documentation

## 2022-04-30 DIAGNOSIS — H25812 Combined forms of age-related cataract, left eye: Secondary | ICD-10-CM | POA: Diagnosis not present

## 2022-04-30 DIAGNOSIS — I482 Chronic atrial fibrillation, unspecified: Secondary | ICD-10-CM | POA: Diagnosis not present

## 2022-04-30 DIAGNOSIS — Z7901 Long term (current) use of anticoagulants: Secondary | ICD-10-CM | POA: Insufficient documentation

## 2022-04-30 DIAGNOSIS — I110 Hypertensive heart disease with heart failure: Secondary | ICD-10-CM | POA: Insufficient documentation

## 2022-04-30 DIAGNOSIS — M1990 Unspecified osteoarthritis, unspecified site: Secondary | ICD-10-CM | POA: Diagnosis not present

## 2022-04-30 DIAGNOSIS — I509 Heart failure, unspecified: Secondary | ICD-10-CM | POA: Diagnosis not present

## 2022-04-30 HISTORY — PX: CATARACT EXTRACTION W/PHACO: SHX586

## 2022-04-30 HISTORY — DX: Polyneuropathy, unspecified: G62.9

## 2022-04-30 HISTORY — DX: Presence of external hearing-aid: Z97.4

## 2022-04-30 HISTORY — DX: Presence of dental prosthetic device (complete) (partial): Z97.2

## 2022-04-30 SURGERY — PHACOEMULSIFICATION, CATARACT, WITH IOL INSERTION
Anesthesia: Monitor Anesthesia Care | Site: Eye | Laterality: Left

## 2022-04-30 MED ORDER — ARMC OPHTHALMIC DILATING DROPS
1.0000 "application " | OPHTHALMIC | Status: DC | PRN
  Administered 2022-04-30 (×3): 1 via OPHTHALMIC

## 2022-04-30 MED ORDER — BRIMONIDINE TARTRATE-TIMOLOL 0.2-0.5 % OP SOLN
OPHTHALMIC | Status: DC | PRN
  Administered 2022-04-30: 1 [drp] via OPHTHALMIC

## 2022-04-30 MED ORDER — SIGHTPATH DOSE#1 NA CHONDROIT SULF-NA HYALURON 40-17 MG/ML IO SOLN
INTRAOCULAR | Status: DC | PRN
  Administered 2022-04-30: 1 mL via INTRAOCULAR

## 2022-04-30 MED ORDER — GLYCOPYRROLATE 0.2 MG/ML IJ SOLN
INTRAMUSCULAR | Status: DC | PRN
  Administered 2022-04-30: .2 mg via INTRAVENOUS

## 2022-04-30 MED ORDER — MIDAZOLAM HCL 2 MG/2ML IJ SOLN
INTRAMUSCULAR | Status: DC | PRN
  Administered 2022-04-30: .5 mg via INTRAVENOUS

## 2022-04-30 MED ORDER — SIGHTPATH DOSE#1 BSS IO SOLN
INTRAOCULAR | Status: DC | PRN
  Administered 2022-04-30: 15 mL

## 2022-04-30 MED ORDER — TETRACAINE HCL 0.5 % OP SOLN
1.0000 [drp] | OPHTHALMIC | Status: DC | PRN
  Administered 2022-04-30 (×3): 1 [drp] via OPHTHALMIC

## 2022-04-30 MED ORDER — SIGHTPATH DOSE#1 BSS IO SOLN
INTRAOCULAR | Status: DC | PRN
  Administered 2022-04-30: 65 mL via OPHTHALMIC

## 2022-04-30 MED ORDER — ONDANSETRON HCL 4 MG/2ML IJ SOLN: 4.0000 mg | Freq: Once | INTRAMUSCULAR | Status: DC | PRN

## 2022-04-30 MED ORDER — FENTANYL CITRATE (PF) 100 MCG/2ML IJ SOLN
INTRAMUSCULAR | Status: DC | PRN
Start: 2022-04-30 — End: 2022-04-30
  Administered 2022-04-30: 50 ug via INTRAVENOUS

## 2022-04-30 MED ORDER — SIGHTPATH DOSE#1 BSS IO SOLN
INTRAOCULAR | Status: DC | PRN
  Administered 2022-04-30: 1 mL

## 2022-04-30 MED ORDER — ACETAMINOPHEN 160 MG/5ML PO SOLN: 325.0000 mg | ORAL | Status: DC | PRN

## 2022-04-30 MED ORDER — MOXIFLOXACIN HCL 0.5 % OP SOLN
OPHTHALMIC | Status: DC | PRN
  Administered 2022-04-30: 0.2 mL via OPHTHALMIC

## 2022-04-30 MED ORDER — ACETAMINOPHEN 325 MG PO TABS: 325.0000 mg | ORAL_TABLET | ORAL | Status: DC | PRN

## 2022-04-30 SURGICAL SUPPLY — 16 items
CANNULA ANT/CHMB 27G (MISCELLANEOUS) IMPLANT
CANNULA ANT/CHMB 27GA (MISCELLANEOUS) IMPLANT
CATARACT SUITE SIGHTPATH (MISCELLANEOUS) ×2 IMPLANT
FEE CATARACT SUITE SIGHTPATH (MISCELLANEOUS) ×1 IMPLANT
GLOVE SURG ENC TEXT LTX SZ8 (GLOVE) ×2 IMPLANT
GLOVE SURG TRIUMPH 8.0 PF LTX (GLOVE) ×2 IMPLANT
LENS IOL TECNIS EYHANCE 19.5 (Intraocular Lens) ×1 IMPLANT
NDL FILTER BLUNT 18X1 1/2 (NEEDLE) ×1 IMPLANT
NEEDLE FILTER BLUNT 18X 1/2SAF (NEEDLE) ×1
NEEDLE FILTER BLUNT 18X1 1/2 (NEEDLE) ×1 IMPLANT
PACK VIT ANT 23G (MISCELLANEOUS) IMPLANT
RING MALYGIN (MISCELLANEOUS) IMPLANT
SUT ETHILON 10-0 CS-B-6CS-B-6 (SUTURE)
SUTURE EHLN 10-0 CS-B-6CS-B-6 (SUTURE) IMPLANT
SYR 3ML LL SCALE MARK (SYRINGE) ×2 IMPLANT
WATER STERILE IRR 250ML POUR (IV SOLUTION) ×2 IMPLANT

## 2022-04-30 NOTE — Transfer of Care (Signed)
Immediate Anesthesia Transfer of Care Note  Patient: Micheal Wright  Procedure(s) Performed: CATARACT EXTRACTION PHACO AND INTRAOCULAR LENS PLACEMENT (IOC) LEFT (Left: Eye)  Patient Location: PACU  Anesthesia Type: MAC  Level of Consciousness: awake, alert  and patient cooperative  Airway and Oxygen Therapy: Patient Spontanous Breathing and Patient connected to supplemental oxygen  Post-op Assessment: Post-op Vital signs reviewed, Patient's Cardiovascular Status Stable, Respiratory Function Stable, Patent Airway and No signs of Nausea or vomiting  Post-op Vital Signs: Reviewed and stable  Complications: No notable events documented.

## 2022-04-30 NOTE — Op Note (Signed)
PREOPERATIVE DIAGNOSIS:  Nuclear sclerotic cataract of the left eye.   POSTOPERATIVE DIAGNOSIS:  Nuclear sclerotic cataract of the left eye.   OPERATIVE PROCEDURE:ORPROCALL@   SURGEON:  Birder Robson, MD.   ANESTHESIA:  Anesthesiologist: Veda Canning, MD CRNA: Silvana Newness, CRNA  1.      Managed anesthesia care. 2.     0.67m of Shugarcaine was instilled following the paracentesis   COMPLICATIONS:  None.   TECHNIQUE:   Stop and chop   DESCRIPTION OF PROCEDURE:  The patient was examined and consented in the preoperative holding area where the aforementioned topical anesthesia was applied to the left eye and then brought back to the Operating Room where the left eye was prepped and draped in the usual sterile ophthalmic fashion and a lid speculum was placed. A paracentesis was created with the side port blade and the anterior chamber was filled with viscoelastic. A near clear corneal incision was performed with the steel keratome. A continuous curvilinear capsulorrhexis was performed with a cystotome followed by the capsulorrhexis forceps. Hydrodissection and hydrodelineation were carried out with BSS on a blunt cannula. The lens was removed in a stop and chop  technique and the remaining cortical material was removed with the irrigation-aspiration handpiece. The capsular bag was inflated with viscoelastic and the Technis [id number] lens was placed in the capsular bag without complication. The remaining viscoelastic was removed from the eye with the irrigation-aspiration handpiece. The wounds were hydrated. The anterior chamber was flushed with BSS and the eye was inflated to physiologic pressure. 0.143mVigamox was placed in the anterior chamber. The wounds were found to be water tight. The eye was dressed with Combigan. The patient was given protective glasses to wear throughout the day and a shield with which to sleep tonight. The patient was also given drops with which to begin a drop regimen  today and will follow-up with me in one day. Implant Name Type Inv. Item Serial No. Manufacturer Lot No. LRB No. Used Action  LENS IOL TECNIS EYHANCE 19.5 - [id number] Lens LENS IOL TECNIS EYHANCE 19.5 [id number]  Left 1 Implanted    Procedure(s) with comments: CATARACT EXTRACTION PHACO AND INTRAOCULAR LENS PLACEMENT (IOC) LEFT (Left) - 12.60 1:11.2  Electronically signed: WiBirder Robson/30/2023 12:58 PM

## 2022-04-30 NOTE — H&P (Signed)
[facility]   Primary Care Physician:  Jerrol Banana., MD Ophthalmologist: Dr. George Ina   Pre-Procedure History & Physical: HPI:  Micheal Wright is a 81 y.o. male here for cataract surgery.   Past Medical History:  Diagnosis Date   APC (atrial premature contractions) 05/04/2014   Ascending aorta dilation ([place]) 03/02/2018   Atherosclerosis of coronary artery 11/08/2008   All risk factors treated.    Atrial complex, premature 05/04/2014   CAD (coronary artery disease)    s/p CABG 1998; Last cath 2002 in setting of marked ST elevation during stress testing. patent grafts; Echo 8/09 EF 55%. No significant valvular disease.   Cardiac murmur 09/03/2011   Overview:  Overview:  Last Assessment & Plan:  Likely mild aortic valve sclerosis. Will check echo.     Carotid artery disease ([place])    u/s 9/10: R 40-59% L 0-39%   Carotid artery obstruction 08/22/2009   Overview:  Overview:  Qualifier: Diagnosis of  By: Haroldine Laws, MD, Eileen Stanford  Last Assessment & Plan:  Asymptomatic. Due for repeat u/s.  Overview:  Overview:  Qualifier: Diagnosis of  By: Haroldine Laws, MD, Eileen Stanford  Last Assessment & Plan:  Asymptomatic. Due for repeat u/s.     Carotid stenosis 08/22/2009   Qualifier: Diagnosis of  By: Haroldine Laws, MD, Eileen Stanford    Cerebrovascular disease 11/17/2008   Chest tightness 01/20/2015   CHF (congestive heart failure) (HCC)    Chronic atrial fibrillation ([place]) 03/02/2018   Coronary artery disease 05/11/2007   Qualifier: Diagnosis of  By: Lurlean Nanny LPN, Rollene Fare     Erectile dysfunction    Essential hypertension 05/11/2007   Overview:  Overview:  Overview:  Qualifier: Diagnosis of  By: Lurlean Nanny LPN, [place]   Last Assessment & Plan:  BP up slightly here but when he checks at [employer] usually 120/65. Will continue current regimen.  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   Sleep apnea    Wears dentures    Full upper, partial lower   Wears hearing aid in both ears     Past Surgical History:  Procedure Laterality Date   ANGIOPLASTY     x 2   APPENDECTOMY  1960's   CARDIAC CATHETERIZATION  2002   CARDIOVERSION N/A 11/14/2015   Procedure: CARDIOVERSION;  Surgeon: Jolaine Artist, MD;  Location: [place];  Service: Cardiovascular;  Laterality: N/A;   COLONOSCOPY W/ POLYPECTOMY     COLONOSCOPY WITH PROPOFOL N/A 11/13/2015   Procedure: COLONOSCOPY WITH PROPOFOL;  Surgeon: Manya Silvas, MD;  Location: [place];  Service: Endoscopy;  Laterality: N/A;   COLONOSCOPY WITH PROPOFOL N/A 02/01/2019   Procedure: COLONOSCOPY WITH PROPOFOL;  Surgeon: Manya Silvas, MD;  Location: [facility] ENDOSCOPY;  Service: Endoscopy;  Laterality: N/A;   CORONARY ARTERY BYPASS GRAFT  1998   ESOPHAGOGASTRODUODENOSCOPY  04/2004   Inflam. at Z line (biopsy neg)   ESOPHAGOGASTRODUODENOSCOPY N/A 02/01/2019   Procedure: ESOPHAGOGASTRODUODENOSCOPY (EGD);  Surgeon: Manya Silvas, MD;  Location: [facility]  ENDOSCOPY;  Service: Endoscopy;  Laterality: N/A;   HERPES SIMPLEX VIRUS DFA N/A    KNEE ARTHROSCOPY W/ PARTIAL MEDIAL MENISCECTOMY  1990s   Left knee   MRI     sleep study     UVULOPALATOPHARYNGOPLASTY      Prior to Admission medications   Medication Sig Start Date End Date Taking? Authorizing Provider  amLODipine (NORVASC) 5 MG tablet TAKE 1 TABLET BY MOUTH ONCE A DAY 06/28/21  Yes Bensimhon, Shaune Pascal, MD  atorvastatin (LIPITOR) 80 MG tablet TAKE 1 TABLET BY MOUTH DAILY 07/17/21  Yes Bensimhon, Shaune Pascal, MD  ELIQUIS 5 MG TABS tablet TAKE 1 TABLET BY MOUTH TWICE A DAY 07/17/21  Yes  Bensimhon, Shaune Pascal, MD  hydrochlorothiazide (MICROZIDE) 12.5 MG capsule TAKE 1 CAPSULE BY MOUTH ONCE DAILY 08/15/21  Yes Bensimhon, Shaune Pascal, MD  losartan (COZAAR) 50 MG tablet TAKE 1 TABLET BY MOUTH ONCE A DAY 08/15/21  Yes Bensimhon, Shaune Pascal, MD  NITROSTAT 0.4 MG SL tablet DISSOLVE 1 TABLET UNDER THE TONGUE FOR CHEST PAIN. MAY REPEAT EVERY 5MINUTES UP TO 3 DOSES. IF NO RELIEF, CALL 911** 08/16/16  Yes Bensimhon, Shaune Pascal, MD  omeprazole (PRILOSEC) 40 MG capsule TAKE 1 CAPSULE BY MOUTH ONCE DAILY 04/15/22  Yes Jerrol Banana., MD    Allergies as of 04/05/2022   (No Known Allergies)    Family History  Problem Relation Age of Onset   Stroke Mother    Heart attack Father    Alcohol abuse Father    Breast cancer Sister        88   Heart disease Sister    Rheumatologic disease Brother        Heart   Heart disease Brother    Breast cancer Maternal Aunt        dx 57s d. 71s   Colon cancer Paternal Uncle        dx 38s   Colon polyps Son        5-6   Hypertension Neg Hx    Diabetes Neg Hx    Prostate cancer Neg Hx     Social History   Socioeconomic History   Marital status: Married    Spouse name: Not on file   Number of children: 3   Years of education: Not on file   Highest education level: 12th grade  Occupational History   Occupation: Press photographer, Academic librarian parts - Retired  Tobacco Use   Smoking status: Former   Smokeless tobacco: Never   Tobacco comments:    quit around age [age], did not inhale  Vaping Use   Vaping Use: Never used  Substance and Sexual Activity   Alcohol use: No   Drug use: No   Sexual activity: Never  Other Topics Concern   Not on file  Social History Narrative   Married with 3 children   Social Determinants of Health   Financial Resource Strain: Not on file  Food Insecurity: Not on file  Transportation Needs: Not on file  Physical Activity: Not on file  Stress: Not on file  Social Connections: Not on file  Intimate Partner Violence: Not on  file    Review of Systems: See HPI, otherwise negative ROS  Physical Exam: BP 111/73   Pulse (!) 57   Temp 98.1 F (36.7 C) (Temporal)   Ht '5\' 10"'$  (1.778 m)   Wt 84.4 kg   SpO2 100%   BMI 26.69 kg/m  General:   Alert, cooperative  in NAD Head:  Normocephalic and atraumatic. Respiratory:  Normal work of breathing. Cardiovascular:  RRR  Impression/Plan: Micheal Wright is here for cataract surgery.  Risks, benefits, limitations, and alternatives regarding cataract surgery have been reviewed with the patient.  Questions have been answered.  All parties agreeable.   Birder Robson, MD  04/30/2022, 12:28 PM

## 2022-04-30 NOTE — Anesthesia Postprocedure Evaluation (Signed)
Anesthesia Post Note  Patient: Micheal Wright  Procedure(s) Performed: CATARACT EXTRACTION PHACO AND INTRAOCULAR LENS PLACEMENT (IOC) LEFT (Left: Eye)     Patient location during evaluation: PACU Anesthesia Type: MAC Level of consciousness: awake Pain management: pain level controlled Vital Signs Assessment: post-procedure vital signs reviewed and stable Respiratory status: respiratory function stable Cardiovascular status: stable Postop Assessment: no apparent nausea or vomiting Anesthetic complications: no   No notable events documented.  Veda Canning

## 2022-04-30 NOTE — Anesthesia Preprocedure Evaluation (Signed)
Anesthesia Evaluation  Patient identified by MRN, date of birth, ID band Patient awake    Reviewed: Allergy & Precautions, NPO status   Airway Mallampati: II  TM Distance: >3 FB     Dental   Pulmonary sleep apnea , former smoker,    Pulmonary exam normal        Cardiovascular hypertension, + CAD, + CABG (1998) and +CHF   Rhythm:Regular Rate:Normal     Neuro/Psych    GI/Hepatic GERD  ,  Endo/Other    Renal/GU      Musculoskeletal  (+) Arthritis ,   Abdominal   Peds  Hematology  (+) Blood dyscrasia, anemia ,   Anesthesia Other Findings   Reproductive/Obstetrics                             Anesthesia Physical Anesthesia Plan  ASA: 3  Anesthesia Plan: MAC   Post-op Pain Management: Minimal or no pain anticipated   Induction: Intravenous  PONV Risk Score and Plan: TIVA, Midazolam and Treatment may vary due to age or medical condition  Airway Management Planned: Natural Airway and Nasal Cannula  Additional Equipment:   Intra-op Plan:   Post-operative Plan:   Informed Consent: I have reviewed the patients History and Physical, chart, labs and discussed the procedure including the risks, benefits and alternatives for the proposed anesthesia with the patient or authorized representative who has indicated his/her understanding and acceptance.       Plan Discussed with: CRNA  Anesthesia Plan Comments:         Anesthesia Quick Evaluation

## 2022-05-01 ENCOUNTER — Encounter: Payer: Self-pay | Admitting: Ophthalmology

## 2022-05-06 DIAGNOSIS — H2511 Age-related nuclear cataract, right eye: Secondary | ICD-10-CM | POA: Diagnosis not present

## 2022-05-13 NOTE — Discharge Instructions (Signed)

## 2022-05-14 ENCOUNTER — Encounter: Payer: Self-pay | Admitting: Ophthalmology

## 2022-05-14 ENCOUNTER — Ambulatory Visit
Admission: RE | Admit: 2022-05-14 | Discharge: 2022-05-14 | Disposition: A | Discharge status: Home / Self Care | Payer: Medicare Other | Attending: Ophthalmology | Admitting: Ophthalmology

## 2022-05-14 ENCOUNTER — Ambulatory Visit: Payer: Medicare Other | Admitting: Anesthesiology

## 2022-05-14 ENCOUNTER — Encounter
Admission: RE | Disposition: A | Discharge status: Home / Self Care | Payer: Self-pay | Source: Home / Self Care | Attending: Ophthalmology

## 2022-05-14 ENCOUNTER — Other Ambulatory Visit: Payer: Self-pay

## 2022-05-14 DIAGNOSIS — Z8249 Family history of ischemic heart disease and other diseases of the circulatory system: Secondary | ICD-10-CM | POA: Diagnosis not present

## 2022-05-14 DIAGNOSIS — I2510 Atherosclerotic heart disease of native coronary artery without angina pectoris: Secondary | ICD-10-CM | POA: Diagnosis not present

## 2022-05-14 DIAGNOSIS — I482 Chronic atrial fibrillation, unspecified: Secondary | ICD-10-CM | POA: Diagnosis not present

## 2022-05-14 DIAGNOSIS — G4730 Sleep apnea, unspecified: Secondary | ICD-10-CM | POA: Diagnosis not present

## 2022-05-14 DIAGNOSIS — I110 Hypertensive heart disease with heart failure: Secondary | ICD-10-CM | POA: Diagnosis not present

## 2022-05-14 DIAGNOSIS — H25811 Combined forms of age-related cataract, right eye: Secondary | ICD-10-CM | POA: Diagnosis not present

## 2022-05-14 DIAGNOSIS — I739 Peripheral vascular disease, unspecified: Secondary | ICD-10-CM | POA: Insufficient documentation

## 2022-05-14 DIAGNOSIS — Z87891 Personal history of nicotine dependence: Secondary | ICD-10-CM | POA: Diagnosis not present

## 2022-05-14 DIAGNOSIS — Z951 Presence of aortocoronary bypass graft: Secondary | ICD-10-CM | POA: Insufficient documentation

## 2022-05-14 DIAGNOSIS — H2511 Age-related nuclear cataract, right eye: Secondary | ICD-10-CM | POA: Insufficient documentation

## 2022-05-14 DIAGNOSIS — M1990 Unspecified osteoarthritis, unspecified site: Secondary | ICD-10-CM | POA: Insufficient documentation

## 2022-05-14 DIAGNOSIS — I509 Heart failure, unspecified: Secondary | ICD-10-CM | POA: Diagnosis not present

## 2022-05-14 DIAGNOSIS — K219 Gastro-esophageal reflux disease without esophagitis: Secondary | ICD-10-CM | POA: Insufficient documentation

## 2022-05-14 HISTORY — PX: CATARACT EXTRACTION W/PHACO: SHX586

## 2022-05-14 SURGERY — PHACOEMULSIFICATION, CATARACT, WITH IOL INSERTION
Anesthesia: Monitor Anesthesia Care | Site: Eye | Laterality: Right

## 2022-05-14 MED ORDER — SIGHTPATH DOSE#1 BSS IO SOLN
INTRAOCULAR | Status: DC | PRN
  Administered 2022-05-14: 57 mL via OPHTHALMIC

## 2022-05-14 MED ORDER — ARMC OPHTHALMIC DILATING DROPS
1.0000 "application " | OPHTHALMIC | Status: DC | PRN
  Administered 2022-05-14 (×3): 1 via OPHTHALMIC

## 2022-05-14 MED ORDER — GLYCOPYRROLATE 0.2 MG/ML IJ SOLN
INTRAMUSCULAR | Status: DC | PRN
  Administered 2022-05-14: .1 mg via INTRAVENOUS

## 2022-05-14 MED ORDER — MOXIFLOXACIN HCL 0.5 % OP SOLN
OPHTHALMIC | Status: DC | PRN
  Administered 2022-05-14: 0.2 mL via OPHTHALMIC

## 2022-05-14 MED ORDER — ACETAMINOPHEN 160 MG/5ML PO SOLN: 325.0000 mg | Freq: Once | ORAL | Status: DC

## 2022-05-14 MED ORDER — SIGHTPATH DOSE#1 BSS IO SOLN
INTRAOCULAR | Status: DC | PRN
  Administered 2022-05-14: 15 mL

## 2022-05-14 MED ORDER — SIGHTPATH DOSE#1 NA CHONDROIT SULF-NA HYALURON 40-17 MG/ML IO SOLN
INTRAOCULAR | Status: DC | PRN
  Administered 2022-05-14: 1 mL via INTRAOCULAR

## 2022-05-14 MED ORDER — LACTATED RINGERS IV SOLN: INTRAVENOUS | Status: DC

## 2022-05-14 MED ORDER — BRIMONIDINE TARTRATE-TIMOLOL 0.2-0.5 % OP SOLN
OPHTHALMIC | Status: DC | PRN
  Administered 2022-05-14: 1 [drp] via OPHTHALMIC

## 2022-05-14 MED ORDER — ACETAMINOPHEN 325 MG PO TABS: 325.0000 mg | ORAL_TABLET | Freq: Once | ORAL | Status: DC

## 2022-05-14 MED ORDER — SIGHTPATH DOSE#1 BSS IO SOLN
INTRAOCULAR | Status: DC | PRN
  Administered 2022-05-14: 1 mL via INTRAMUSCULAR

## 2022-05-14 MED ORDER — TETRACAINE HCL 0.5 % OP SOLN
1.0000 [drp] | OPHTHALMIC | Status: DC | PRN
  Administered 2022-05-14 (×3): 1 [drp] via OPHTHALMIC

## 2022-05-14 MED ORDER — FENTANYL CITRATE (PF) 100 MCG/2ML IJ SOLN
INTRAMUSCULAR | Status: DC | PRN
  Administered 2022-05-14: 25 ug via INTRAVENOUS

## 2022-05-14 SURGICAL SUPPLY — 10 items
CATARACT SUITE SIGHTPATH (MISCELLANEOUS) ×2 IMPLANT
FEE CATARACT SUITE SIGHTPATH (MISCELLANEOUS) ×1 IMPLANT
GLOVE SURG ENC TEXT LTX SZ8 (GLOVE) ×2 IMPLANT
GLOVE SURG TRIUMPH 8.0 PF LTX (GLOVE) ×2 IMPLANT
LENS IOL TECNIS EYHANCE 20.0 (Intraocular Lens) ×1 IMPLANT
NDL FILTER BLUNT 18X1 1/2 (NEEDLE) ×1 IMPLANT
NEEDLE FILTER BLUNT 18X 1/2SAF (NEEDLE) ×1
NEEDLE FILTER BLUNT 18X1 1/2 (NEEDLE) ×1 IMPLANT
SYR 3ML LL SCALE MARK (SYRINGE) ×2 IMPLANT
WATER STERILE IRR 250ML POUR (IV SOLUTION) ×2 IMPLANT

## 2022-05-14 NOTE — Op Note (Signed)
PREOPERATIVE DIAGNOSIS:  Nuclear sclerotic cataract of the right eye.   POSTOPERATIVE DIAGNOSIS:  H25.11 Cataract   OPERATIVE PROCEDURE:ORPROCALL@   SURGEON:  Birder Robson, MD.   ANESTHESIA:  Anesthesiologist: Ronelle Nigh, MD CRNA: Dionne Bucy, CRNA  1.      Managed anesthesia care. 2.      0.76m of Shugarcaine was instilled in the eye following the paracentesis.   COMPLICATIONS:  None.   TECHNIQUE:   Stop and chop   DESCRIPTION OF PROCEDURE:  The patient was examined and consented in the preoperative holding area where the aforementioned topical anesthesia was applied to the right eye and then brought back to the Operating Room where the right eye was prepped and draped in the usual sterile ophthalmic fashion and a lid speculum was placed. A paracentesis was created with the side port blade and the anterior chamber was filled with viscoelastic. A near clear corneal incision was performed with the steel keratome. A continuous curvilinear capsulorrhexis was performed with a cystotome followed by the capsulorrhexis forceps. Hydrodissection and hydrodelineation were carried out with BSS on a blunt cannula. The lens was removed in a stop and chop  technique and the remaining cortical material was removed with the irrigation-aspiration handpiece. The capsular bag was inflated with viscoelastic and the Technis ZCB00  lens was placed in the capsular bag without complication. The remaining viscoelastic was removed from the eye with the irrigation-aspiration handpiece. The wounds were hydrated. The anterior chamber was flushed with BSS and the eye was inflated to physiologic pressure. 0.141mof Vigamox was placed in the anterior chamber. The wounds were found to be water tight. The eye was dressed with Combigan. The patient was given protective glasses to wear throughout the day and a shield with which to sleep tonight. The patient was also given drops with which to begin a drop regimen today  and will follow-up with me in one day. Implant Name Type Inv. Item Serial No. Manufacturer Lot No. LRB No. Used Action  LENS IOL TECNIS EYHANCE 20.0 - [id number] Lens LENS IOL TECNIS EYHANCE 20.0 [id number]  Right 1 Implanted   Procedure(s): CATARACT EXTRACTION PHACO AND INTRAOCULAR LENS PLACEMENT (IOC) RIGHT 12.30 00:59.3 (Right)  Electronically signed: WiBirder Robson/13/2023 1:11 PM

## 2022-05-14 NOTE — Anesthesia Procedure Notes (Signed)
Procedure Name: MAC Date/Time: 05/14/2022 12:54 PM  Performed by: Dionne Bucy, CRNAPre-anesthesia Checklist: Patient identified, Emergency Drugs available, Suction available, Patient being monitored and Timeout performed Patient Re-evaluated:Patient Re-evaluated prior to induction Oxygen Delivery Method: Nasal cannula Placement Confirmation: positive ETCO2

## 2022-05-14 NOTE — Anesthesia Preprocedure Evaluation (Signed)
Anesthesia Evaluation  Patient identified by MRN, date of birth, ID band Patient awake    Reviewed: Allergy & Precautions, H&P , NPO status , Patient's Chart, lab work & pertinent test results  Airway Mallampati: III  TM Distance: >3 FB Neck ROM: full    Dental no notable dental hx.    Pulmonary sleep apnea , former smoker,    Pulmonary exam normal breath sounds clear to auscultation       Cardiovascular hypertension, + CAD, + CABG (1998), + Peripheral Vascular Disease and +CHF  + dysrhythmias Atrial Fibrillation  Rhythm:irregular Rate:Abnormal     Neuro/Psych    GI/Hepatic GERD  ,  Endo/Other    Renal/GU      Musculoskeletal  (+) Arthritis ,   Abdominal   Peds  Hematology  (+) Blood dyscrasia, anemia ,   Anesthesia Other Findings   Reproductive/Obstetrics                             Anesthesia Physical  Anesthesia Plan  ASA: 3  Anesthesia Plan: MAC   Post-op Pain Management: Minimal or no pain anticipated   Induction: Intravenous  PONV Risk Score and Plan: 1 and TIVA, Midazolam and Treatment may vary due to age or medical condition  Airway Management Planned: Natural Airway and Nasal Cannula  Additional Equipment:   Intra-op Plan:   Post-operative Plan:   Informed Consent: I have reviewed the patients History and Physical, chart, labs and discussed the procedure including the risks, benefits and alternatives for the proposed anesthesia with the patient or authorized representative who has indicated his/her understanding and acceptance.     Dental Advisory Given  Plan Discussed with: CRNA  Anesthesia Plan Comments:         Anesthesia Quick Evaluation

## 2022-05-14 NOTE — Anesthesia Postprocedure Evaluation (Signed)
Anesthesia Post Note  Patient: Micheal Wright  Procedure(s) Performed: CATARACT EXTRACTION PHACO AND INTRAOCULAR LENS PLACEMENT (IOC) RIGHT 12.30 00:59.3 (Right: Eye)     Patient location during evaluation: PACU Anesthesia Type: MAC Level of consciousness: awake and alert and oriented Pain management: satisfactory to patient Vital Signs Assessment: post-procedure vital signs reviewed and stable Respiratory status: spontaneous breathing, nonlabored ventilation and respiratory function stable Cardiovascular status: blood pressure returned to baseline and stable Postop Assessment: Adequate PO intake and No signs of nausea or vomiting Anesthetic complications: no   No notable events documented.  Raliegh Ip

## 2022-05-14 NOTE — H&P (Signed)
[facility]   Primary Care Physician:  Jerrol Banana., MD Ophthalmologist: Dr. George Ina  Pre-Procedure History & Physical: HPI:  Micheal Wright is a 81 y.o. male here for cataract surgery.   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    Atrial complex, premature 05/04/2014   CAD (coronary artery disease)    s/p CABG 1998; Last cath 2002 in setting of marked ST elevation during stress testing. patent grafts; Echo 8/09 EF 55%. No significant valvular disease.   Cardiac murmur 09/03/2011   Overview:  Overview:  Last Assessment & Plan:  Likely mild aortic valve sclerosis. Will check echo.     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 Due for repeat u/s.  Overview:  Overview:  Qualifier: Diagnosis of  By: Haroldine Laws, MD, Eileen Stanford  Last Assessment & Plan:  Asymptomatic. Due for repeat u/s.     Carotid stenosis 08/22/2009   Qualifier: Diagnosis of  By: Haroldine Laws, MD, Eileen Stanford    Cerebrovascular disease 11/17/2008   Chest tightness 01/20/2015   CHF (congestive heart failure) (HCC)    Chronic atrial fibrillation ([place]) 03/02/2018   Coronary artery disease 05/11/2007   Qualifier: Diagnosis of  By: Lurlean Nanny LPN, Rollene Fare     Erectile dysfunction    Essential hypertension 05/11/2007   Overview:  Overview:  Overview:  Qualifier: Diagnosis of  By: Lurlean Nanny LPN, [place]   Last Assessment & Plan:  BP up slightly here but when he checks at [employer] usually 120/65. Will continue current regimen.  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   Sleep apnea    Wears dentures    Full upper, partial lower   Wears hearing aid in both ears     Past Surgical History:  Procedure Laterality Date   ANGIOPLASTY     x 2   APPENDECTOMY  1960's   CARDIAC CATHETERIZATION  2002   CARDIOVERSION N/A 11/14/2015   Procedure: CARDIOVERSION;  Surgeon: Jolaine Artist, MD;  Location: [place];  Service: Cardiovascular;  Laterality: N/A;   CATARACT EXTRACTION W/PHACO Left 04/30/2022   Procedure: CATARACT EXTRACTION PHACO AND INTRAOCULAR LENS PLACEMENT ([place]) LEFT;  Surgeon: Birder Robson, MD;  Location: [place];  Service: Ophthalmology;  Laterality: Left;  12.60 1:11.2   COLONOSCOPY W/ POLYPECTOMY     COLONOSCOPY WITH PROPOFOL N/A 11/13/2015   Procedure: COLONOSCOPY WITH PROPOFOL;  Surgeon: Manya Silvas, MD;  Location: [facility] ENDOSCOPY;  Service: Endoscopy;  Laterality: N/A;   COLONOSCOPY WITH PROPOFOL N/A 02/01/2019   Procedure: COLONOSCOPY WITH PROPOFOL;  Surgeon: Manya Silvas, MD;  Location: [facility] ENDOSCOPY;  Service: Endoscopy;  Laterality: N/A;   CORONARY  ARTERY BYPASS GRAFT  1998   ESOPHAGOGASTRODUODENOSCOPY  04/2004   Inflam. at Z line (biopsy neg)   ESOPHAGOGASTRODUODENOSCOPY N/A 02/01/2019   Procedure: ESOPHAGOGASTRODUODENOSCOPY (EGD);  Surgeon: Manya Silvas, MD;  Location: [facility] ENDOSCOPY;  Service: Endoscopy;  Laterality: N/A;   HERPES SIMPLEX VIRUS DFA N/A    KNEE ARTHROSCOPY W/ PARTIAL MEDIAL MENISCECTOMY  1990s   Left knee   MRI     sleep study     UVULOPALATOPHARYNGOPLASTY      Prior to Admission medications   Medication Sig Start Date End Date Taking? Authorizing Provider  amLODipine (NORVASC) 5 MG  tablet TAKE 1 TABLET BY MOUTH ONCE A DAY 06/28/21  Yes Bensimhon, Shaune Pascal, MD  atorvastatin (LIPITOR) 80 MG tablet TAKE 1 TABLET BY MOUTH DAILY 07/17/21  Yes Bensimhon, Shaune Pascal, MD  ELIQUIS 5 MG TABS tablet TAKE 1 TABLET BY MOUTH TWICE A DAY 07/17/21  Yes Bensimhon, Shaune Pascal, MD  hydrochlorothiazide (MICROZIDE) 12.5 MG capsule TAKE 1 CAPSULE BY MOUTH ONCE DAILY 08/15/21  Yes Bensimhon, Shaune Pascal, MD  losartan (COZAAR) 50 MG tablet TAKE 1 TABLET BY MOUTH ONCE A DAY 08/15/21  Yes Bensimhon, Shaune Pascal, MD  omeprazole (PRILOSEC) 40 MG capsule TAKE 1 CAPSULE BY MOUTH ONCE DAILY 04/15/22  Yes Jerrol Banana., MD  NITROSTAT 0.4 MG SL tablet DISSOLVE 1 TABLET UNDER THE TONGUE FOR CHEST PAIN. MAY REPEAT EVERY 5MINUTES UP TO 3 DOSES. IF NO RELIEF, CALL 911** 08/16/16   Bensimhon, Shaune Pascal, MD    Allergies as of 04/05/2022   (No Known Allergies)    Family History  Problem Relation Age of Onset   Stroke Mother    Heart attack Father    Alcohol abuse Father    Breast cancer Sister        68   Heart disease Sister    Rheumatologic disease Brother        Heart   Heart disease Brother    Breast cancer Maternal Aunt        dx 52s d. 16s   Colon cancer Paternal Uncle        dx 40s   Colon polyps Son        5-6   Hypertension Neg Hx    Diabetes Neg Hx    Prostate cancer Neg Hx     Social History   Socioeconomic History   Marital status: Married    Spouse name: Not on file   Number of children: 3   Years of education: Not on file   Highest education level: 12th grade  Occupational History   Occupation: Press photographer, Academic librarian parts - Retired  Tobacco Use   Smoking status: Former   Smokeless tobacco: Never   Tobacco comments:    quit around age 21-20, did not inhale  Vaping Use   Vaping Use: Never used  Substance and Sexual Activity   Alcohol use: No   Drug use: No   Sexual activity: Never  Other Topics Concern   Not on file  Social History Narrative   Married with 3 children   Social  Determinants of Health   Financial Resource Strain: Low Risk  (10/04/2020)   Overall Financial Resource Strain (CARDIA)    Difficulty of Paying Living Expenses: Not hard at all  Food Insecurity: No Food Insecurity (10/04/2020)   Hunger Vital Sign    Worried About Running Out of Food in the Last Year: Never true    [place] in  the Last Year: Never true  Transportation Needs: No Transportation Needs (10/04/2020)   PRAPARE - Hydrologist (Medical): No    Lack of Transportation (Non-Medical): No  Physical Activity: Inactive (10/04/2020)   Exercise Vital Sign    Days of Exercise per Week: 0 days    Minutes of Exercise per Session: 0 min  Stress: No Stress Concern Present (10/04/2020)   [place]    Feeling of Stress : Only a little  Social Connections: Moderately Integrated (10/04/2020)   Social Connection and Isolation Panel [NHANES]    Frequency of Communication with Friends and Family: More than three times a week    Frequency of Social Gatherings with Friends and Family: More than three times a week    Attends Religious Services: More than 4 times per year    Active Member of [employer] or Organizations: No    Attends Archivist Meetings: Never    Marital Status: Married  Human resources officer Violence: Not At Risk (10/04/2020)   Humiliation, Afraid, Rape, and Kick questionnaire    Fear of Current or Ex-Partner: No    Emotionally Abused: No    Physically Abused: No    Sexually Abused: No    Review of Systems: See HPI, otherwise negative ROS  Physical Exam: BP (!) 137/57   Pulse (!) 56   Temp 98 F (36.7 C) (Temporal)   Ht '5\' 10"'$  (1.778 m)   Wt 84.9 kg   SpO2 98%   BMI 26.85 kg/m  General:   Alert, cooperative in NAD Head:  Normocephalic and atraumatic. Respiratory:  Normal work of breathing. Cardiovascular:  RRR  Impression/Plan: Micheal Wright is here for cataract  surgery.  Risks, benefits, limitations, and alternatives regarding cataract surgery have been reviewed with the patient.  Questions have been answered.  All parties agreeable.   Birder Robson, MD  05/14/2022, 12:47 PM

## 2022-05-14 NOTE — Transfer of Care (Signed)
Immediate Anesthesia Transfer of Care Note  Patient: Micheal Wright  Procedure(s) Performed: CATARACT EXTRACTION PHACO AND INTRAOCULAR LENS PLACEMENT (I[facility]) RIGHT 12.30 00:59.3 (Right: Eye)  Patient Location: PACU  Anesthesia Type: MAC  Level of Consciousness: awake, alert  and patient cooperative  Airway and Oxygen Therapy: Patient Spontanous Breathing and Patient connected to supplemental oxygen  Post-op Assessment: Post-op Vital signs reviewed, Patient's Cardiovascular Status Stable, Respiratory Function Stable, Patent Airway and No signs of Nausea or vomiting  Post-op Vital Signs: Reviewed and stable  Complications: No notable events documented.

## 2022-05-15 ENCOUNTER — Telehealth (HOSPITAL_COMMUNITY): Payer: Self-pay | Admitting: *Deleted

## 2022-05-15 ENCOUNTER — Encounter: Payer: Self-pay | Admitting: Ophthalmology

## 2022-05-15 NOTE — Telephone Encounter (Addendum)
Pts wife called to report pts heart rate dropped to 38-41bpm during his cataract surgery after mild sedation. Pt was asymptomatic. Pts heart rate normally runs 60s-90s. She is aware that heart rate can decrease with sedation but still asked that I forward this message to Dr.Bensimhon to see if pt needed to decrease any medications. Pts bp remained normal during procedure per wife.

## 2022-05-16 NOTE — Telephone Encounter (Signed)
Pt's wife aware.

## 2022-05-29 DIAGNOSIS — C44622 Squamous cell carcinoma of skin of right upper limb, including shoulder: Secondary | ICD-10-CM | POA: Diagnosis not present

## 2022-05-29 DIAGNOSIS — D485 Neoplasm of uncertain behavior of skin: Secondary | ICD-10-CM | POA: Diagnosis not present

## 2022-06-10 ENCOUNTER — Telehealth: Payer: Self-pay

## 2022-06-10 NOTE — Telephone Encounter (Signed)
Copied from [place] [id number]. Topic: Appointment Scheduling - Scheduling Inquiry for Clinic >> Jun 10, 2022  4:02 PM Everette C wrote: Reason for CRM: Please contact the patient to successfully reschedule their AWV

## 2022-06-25 ENCOUNTER — Encounter (HOSPITAL_COMMUNITY): Payer: Self-pay | Admitting: Internal Medicine

## 2022-06-25 ENCOUNTER — Ambulatory Visit (HOSPITAL_COMMUNITY)
Admission: RE | Admit: 2022-06-25 | Discharge: 2022-06-25 | Disposition: A | Discharge status: Home / Self Care | Payer: Medicare Other | Source: Ambulatory Visit | Attending: Internal Medicine | Admitting: Internal Medicine

## 2022-06-25 VITALS — BP 130/64 | HR 86 | Wt 187.4 lb

## 2022-06-25 DIAGNOSIS — I482 Chronic atrial fibrillation, unspecified: Secondary | ICD-10-CM | POA: Insufficient documentation

## 2022-06-25 DIAGNOSIS — Z951 Presence of aortocoronary bypass graft: Secondary | ICD-10-CM | POA: Insufficient documentation

## 2022-06-25 DIAGNOSIS — I77810 Thoracic aortic ectasia: Secondary | ICD-10-CM

## 2022-06-25 DIAGNOSIS — I110 Hypertensive heart disease with heart failure: Secondary | ICD-10-CM | POA: Diagnosis not present

## 2022-06-25 DIAGNOSIS — R609 Edema, unspecified: Secondary | ICD-10-CM | POA: Insufficient documentation

## 2022-06-25 DIAGNOSIS — Z7901 Long term (current) use of anticoagulants: Secondary | ICD-10-CM | POA: Diagnosis not present

## 2022-06-25 DIAGNOSIS — I5032 Chronic diastolic (congestive) heart failure: Secondary | ICD-10-CM | POA: Insufficient documentation

## 2022-06-25 DIAGNOSIS — I2510 Atherosclerotic heart disease of native coronary artery without angina pectoris: Secondary | ICD-10-CM | POA: Insufficient documentation

## 2022-06-25 DIAGNOSIS — I10 Essential (primary) hypertension: Secondary | ICD-10-CM

## 2022-06-25 DIAGNOSIS — G4733 Obstructive sleep apnea (adult) (pediatric): Secondary | ICD-10-CM | POA: Diagnosis not present

## 2022-06-25 DIAGNOSIS — I6529 Occlusion and stenosis of unspecified carotid artery: Secondary | ICD-10-CM | POA: Diagnosis not present

## 2022-06-25 DIAGNOSIS — M25562 Pain in left knee: Secondary | ICD-10-CM | POA: Insufficient documentation

## 2022-06-25 MED ORDER — NITROGLYCERIN 0.4 MG SL SUBL: SUBLINGUAL_TABLET | SUBLINGUAL | 3 refills | Status: DC

## 2022-06-25 NOTE — Addendum Note (Signed)
Encounter addended by: Jerl Mina, RN on: 06/25/2022 11:26 AM  Actions taken: Visit diagnoses modified, Order list changed, Diagnosis association updated, Clinical Note Signed

## 2022-06-25 NOTE — Patient Instructions (Addendum)
There has been no change to your medications.  Your physician has requested that you have an echocardiogram. Echocardiography is a painless test that uses sound waves to create images of your heart. It provides your doctor with information about the size and shape of your heart and how well your heart's chambers and valves are working. This procedure takes approximately one hour. There are no restrictions for this procedure.  Your physician recommends that you schedule a follow-up appointment in: 1 year ( July 2023)  ** please call the office in April 2024 to arrange your follow up appointment **  If you have any questions or concerns before your next appointment please send [place] a message through [place] or call our office at [phone].    TO LEAVE A MESSAGE FOR THE NURSE SELECT OPTION 2, PLEASE LEAVE A MESSAGE INCLUDING: YOUR NAME DATE OF BIRTH CALL BACK NUMBER REASON FOR CALL**this is important as we prioritize the call backs  YOU WILL RECEIVE A CALL BACK THE SAME DAY AS LONG AS YOU CALL BEFORE 4:00 PM  At the [facility], you and your health needs are our priority. As part of our continuing mission to provide you with exceptional heart care, we have created designated Provider Care Teams. These Care Teams include your primary Cardiologist (physician) and Advanced Practice Providers (APPs- Physician Assistants and Nurse Practitioners) who all work together to provide you with the care you need, when you need it.   You may see any of the following providers on your designated Care Team at your next follow up: Dr [name] Dr Haynes Kerns, NP Lyda Jester, [place] [facility] [place], [place] Audry Riles, PharmD   Please be sure to bring in all your medications bottles to every appointment.

## 2022-06-25 NOTE — Progress Notes (Signed)
Advanced Heart Failure Clinic Note   PCP: Dr Rosanna Randy HPI:  Micheal Wright is a 81 y.o. male with a history of coronary artery disease status post coronary artery bypass grafting in 1998.  Last catheterization was in January 2002 due to an abnormal stress test with 4 mm of ST elevation during exercise.  Cardiac catheterization showed patent grafts.  He has a history of hypertension, PAF, hyperlipidemia, and sleep apnea. Myoview 2/09 EF 61% No ischemia.   Had episode PAF in 12/16 during colonoscopy. Underwent DC-CV. Started Eliquis.   Echo 8/22: EF 60-65%. RV low normal RVSP ok. Personally reviewed  Today he returns for HF follow up. Feels OK. Remains fairly active. Denies CP or SOB. Has been struggling with left knee pain.   Studies:  Sleep Study  04/03/2018 . CPAP Titration completed 04/14/2018   PYP 03/26/2018   H/CL 1.14 Visual grade 2. Equivocal  cMRI 5/19: EF 52% mid myocardial gadolinium uptake on delayed inversion recovery sequences in the septum, apex and inferior base. Reviewed by Drs. Meda Coffee and [place] and not felt c/w amyloid  Echo 03/02/2018  LVEF 55-60% Mild RV dilation. Mild to mod TR. RVSP not elevated on echo. Personally reviewed  Echo 01/2015 EF 60-65%, no AR, Mild MR/TR, PA peak pressure 33 mmHg, GLPSS -21% LA size 3.8cm Carotid US 01/2015 with minimal plaque bilaterally.  Carotid u/s 11/2012  R ok 40-59% (stable).                       3/16: 1-39% bilaterally ABI 12/2009  R 1.0  L 1.1    Review of systems complete and found to be negative unless listed in HPI.    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    Atrial complex, premature 05/04/2014   CAD (coronary artery disease)    s/p CABG 1998; Last cath 2002 in setting of marked ST elevation during stress testing. patent grafts; Echo 8/09 EF 55%. No  significant valvular disease.   Cardiac murmur 09/03/2011   Overview:  Overview:  Last Assessment & Plan:  Likely mild aortic valve sclerosis. Will check echo.     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 Due for repeat u/s.  Overview:  Overview:  Qualifier: Diagnosis of  By: Haroldine Laws, MD, Eileen Stanford  Last Assessment & Plan:  Asymptomatic. Due for repeat u/s.     Carotid stenosis 08/22/2009   Qualifier: Diagnosis of  By: Haroldine Laws, MD, Eileen Stanford    Cerebrovascular disease 11/17/2008   Chest tightness 01/20/2015   CHF (congestive heart failure) (HCC)    Chronic atrial fibrillation ([place]) 03/02/2018   Coronary artery disease 05/11/2007   Qualifier: Diagnosis of  By: Lurlean Nanny LPN, Rollene Fare     Erectile dysfunction    Essential hypertension 05/11/2007   Overview:  Overview:  Overview:  Qualifier: Diagnosis of  By: Lurlean Nanny LPN, [place]   Last Assessment & Plan:  BP up slightly here but when he checks [employer] usually 120/65. Will continue current regimen.  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   Sleep apnea    Wears dentures    Full upper, partial lower   Wears hearing aid in both ears     Current Outpatient Medications  Medication Sig Dispense Refill   amLODipine (NORVASC) 5 MG tablet TAKE 1 TABLET BY MOUTH ONCE A DAY 90 tablet 4   atorvastatin (LIPITOR) 80 MG tablet TAKE 1 TABLET BY MOUTH DAILY 90  tablet 3   ELIQUIS 5 MG TABS tablet TAKE 1 TABLET BY MOUTH TWICE A DAY 180 tablet 3   hydrochlorothiazide (MICROZIDE) 12.5 MG capsule TAKE 1 CAPSULE BY MOUTH ONCE DAILY 90 capsule 3   losartan (COZAAR) 50 MG tablet TAKE 1 TABLET BY MOUTH ONCE A DAY 90 tablet 3   NITROSTAT 0.4 MG SL tablet DISSOLVE 1 TABLET UNDER THE TONGUE FOR CHEST PAIN. MAY REPEAT EVERY 5MINUTES UP TO 3 DOSES. IF NO RELIEF, CALL 911** 25 tablet 3   omeprazole (PRILOSEC) 40 MG capsule TAKE 1 CAPSULE BY MOUTH ONCE DAILY 90 capsule 0   No current facility-administered medications for this encounter.   Vitals:   06/25/22 1059  BP: 130/64  Pulse: 86  SpO2: 98%  Weight: 85 kg (187 lb 6.4 oz)   Wt Readings from Last 3 Encounters:  06/25/22 85 kg (187 lb 6.4 oz)  05/14/22 84.9 kg (187 lb 1.6 oz)  04/30/22 84.4 kg (186 lb)    PHYSICAL EXAM: General:  Well appearing. No resp difficulty HEENT: normal Neck: supple. no JVD. Carotids 2+ bilat; no bruits. No lymphadenopathy or thryomegaly appreciated. Cor: PMI nondisplaced. Irregular rate & rhythm. No rubs, gallops or murmurs. Lungs: clear Abdomen: soft, nontender, nondistended. No hepatosplenomegaly. No bruits or masses. Good bowel sounds. Extremities: no cyanosis, clubbing, rash, edema Neuro: alert & orientedx3, cranial nerves grossly intact. moves all 4 extremities w/o difficulty. Affect pleasant  ASSESSMENT & PLAN:  1) LE edema - Resolved with addition of HCTZ - likely mixed picture between dependent edema (with LE varicosities) but also has some RV strain on echo and MRI  - unable to use spiro due to hyperkalemia.  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 Quanitive 1.14 - cMRI 5/19: LVEF 52% mid myocardial gadolinium uptake on delayed inversion recovery sequences in the septum, apex and inferior base. RV 28%  Reviewed by Drs. Meda Coffee and [place] and not felt c/w amyloid  - Overall doing very well. Echo 8/22 ok   2) Chronic a fib.  - Rate 55-110 on Fit Bit On  Eliquis. No bleeding  - CHADS VASC 4 (age =2. CAD, HTN)  3) CAD, s/p CABG 1998 - No s/s ischemia - Myoview 3/16 OK.   4) HTN - Blood pressure well controlled. Continue current regimen.  5) HL - Managed by Dr. Rosanna Randy.   6) Bradycardia - BB previously stopped. Stable. No need for PM at this point - We reviewed HRs on FitBit 55-110   7) Carotid stenosis, mild - US Carotid 05/28/2017 Bilateral 1-39%. No need for f/u testing at this point  8) Sleep Apnea - Completed sleep study 5/32019 Mild OSA AHI 6.1 -  Has stopped using CPAP. Snoring improved with 10 pound weight loss. 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

## 2022-07-04 ENCOUNTER — Encounter: Payer: Medicare Other | Admitting: Family Medicine

## 2022-07-04 DIAGNOSIS — C44622 Squamous cell carcinoma of skin of right upper limb, including shoulder: Secondary | ICD-10-CM | POA: Diagnosis not present

## 2022-07-08 ENCOUNTER — Encounter: Payer: Medicare Other | Admitting: Family Medicine

## 2022-07-11 ENCOUNTER — Telehealth (HOSPITAL_COMMUNITY): Payer: Self-pay | Admitting: *Deleted

## 2022-07-11 ENCOUNTER — Other Ambulatory Visit (HOSPITAL_COMMUNITY): Payer: Self-pay | Admitting: Internal Medicine

## 2022-07-11 ENCOUNTER — Other Ambulatory Visit: Payer: Self-pay | Admitting: Family Medicine

## 2022-07-11 DIAGNOSIS — I2510 Atherosclerotic heart disease of native coronary artery without angina pectoris: Secondary | ICD-10-CM

## 2022-07-11 NOTE — Telephone Encounter (Signed)
Pts wife left vm stating pt did not have an echo with his last appointment and asked if he needed one. Pts last echo was 07/02/21. Pt was ordered an echo for 7/23 but it was not scheduled with follow up.  Routed to [employer] to schedule

## 2022-07-23 ENCOUNTER — Other Ambulatory Visit (HOSPITAL_COMMUNITY): Payer: Self-pay | Admitting: Internal Medicine

## 2022-07-24 DIAGNOSIS — H0015 Chalazion left lower eyelid: Secondary | ICD-10-CM | POA: Diagnosis not present

## 2022-07-26 DIAGNOSIS — L821 Other seborrheic keratosis: Secondary | ICD-10-CM | POA: Diagnosis not present

## 2022-07-26 DIAGNOSIS — D225 Melanocytic nevi of trunk: Secondary | ICD-10-CM | POA: Diagnosis not present

## 2022-07-26 DIAGNOSIS — D2271 Melanocytic nevi of right lower limb, including hip: Secondary | ICD-10-CM | POA: Diagnosis not present

## 2022-07-26 DIAGNOSIS — L570 Actinic keratosis: Secondary | ICD-10-CM | POA: Diagnosis not present

## 2022-07-26 DIAGNOSIS — X32XXXA Exposure to sunlight, initial encounter: Secondary | ICD-10-CM | POA: Diagnosis not present

## 2022-07-26 DIAGNOSIS — D2262 Melanocytic nevi of left upper limb, including shoulder: Secondary | ICD-10-CM | POA: Diagnosis not present

## 2022-07-26 DIAGNOSIS — L718 Other rosacea: Secondary | ICD-10-CM | POA: Diagnosis not present

## 2022-07-26 DIAGNOSIS — D2272 Melanocytic nevi of left lower limb, including hip: Secondary | ICD-10-CM | POA: Diagnosis not present

## 2022-07-26 DIAGNOSIS — D2261 Melanocytic nevi of right upper limb, including shoulder: Secondary | ICD-10-CM | POA: Diagnosis not present

## 2022-08-02 NOTE — Progress Notes (Unsigned)
Annual Wellness Visit  I,Micheal Wright,acting as a scribe for Micheal Durie, MD.,have documented all relevant documentation on the behalf of Micheal Durie, MD,as directed by  Micheal Durie, MD while in the presence of Micheal Durie, MD.     Patient: Micheal Wright, Male    DOB: [birth year], 81 y.o.   MRN: [id number] Visit Date: 08/06/2022  Today's Provider: [name], MD   Chief Complaint  Patient presents with   Medicare Wellness   Subjective    Micheal Wright is a 81 y.o. male who presents today for his Annual Wellness Visit. He reports consuming a general and low sodium diet. The patient does not participate in regular exercise at present. He generally feels fairly well. He reports sleeping fairly well. He does not have additional problems to discuss today.  He feels well and continues to be very active.  He and his wife continue to be very independent. HPI    Medications: Outpatient Medications Prior to Visit  Medication Sig   amLODipine (NORVASC) 5 MG tablet TAKE 1 TABLET BY MOUTH ONCE A DAY   atorvastatin (LIPITOR) 80 MG tablet TAKE 1 TABLET BY MOUTH DAILY   ELIQUIS 5 MG TABS tablet TAKE 1 TABLET BY MOUTH TWICE A DAY   hydrochlorothiazide (MICROZIDE) 12.5 MG capsule TAKE 1 CAPSULE BY MOUTH ONCE DAILY   losartan (COZAAR) 50 MG tablet TAKE 1 TABLET BY MOUTH ONCE A DAY   nitroGLYCERIN (NITROSTAT) 0.4 MG SL tablet DISSOLVE 1 TABLET UNDER THE TONGUE FOR CHEST PAIN. MAY REPEAT EVERY 5MINUTES UP TO 3 DOSES. IF NO RELIEF, CALL 911**   omeprazole (PRILOSEC) 40 MG capsule TAKE 1 CAPSULE BY MOUTH ONCE DAILY   No facility-administered medications prior to visit.    No Known Allergies  Patient Care Team: Jerrol Banana., MD as PCP - General (Unknown Physician Specialty) Haroldine Laws, Shaune Pascal, MD as Consulting Physician (Cardiology) Carloyn Manner, MD as Referring Physician (Otolaryngology) Rodney Booze as Physician  Assistant Oneta Rack, MD (Dermatology)  Review of Systems  HENT:  Positive for hearing loss and tinnitus.   Eyes:  Positive for discharge.  All other systems reviewed and are negative.        Objective    Vitals: BP 133/64 (BP Location: Left Arm, Patient Position: Sitting, Cuff Size: Large)   Pulse 85   Resp 16   Ht '5\' 10"'$  (1.778 m)   Wt 189 lb (85.7 kg)   SpO2 99%   BMI 27.12 kg/m  BP Readings from Last 3 Encounters:  08/06/22 133/64  06/25/22 130/64  05/14/22 119/65   Wt Readings from Last 3 Encounters:  08/06/22 189 lb (85.7 kg)  06/25/22 187 lb 6.4 oz (85 kg)  05/14/22 187 lb 1.6 oz (84.9 kg)       Physical Exam Vitals reviewed.  Constitutional:      Appearance: Normal appearance.  HENT:     Head: Normocephalic and atraumatic.     Right Ear: External ear normal.     Left Ear: External ear normal.     Nose: Nose normal.     Mouth/Throat:     Pharynx: Oropharynx is clear.  Eyes:     General: No scleral icterus.    Conjunctiva/sclera: Conjunctivae normal.  Cardiovascular:     Rate and Rhythm: Normal rate and regular rhythm.     Pulses: Normal pulses.     Heart sounds: Normal heart sounds.  Pulmonary:  Effort: Pulmonary effort is normal.     Breath sounds: Normal breath sounds.  Abdominal:     Palpations: Abdomen is soft.  Musculoskeletal:     Right lower leg: No edema.     Left lower leg: No edema.  Skin:    General: Skin is warm and dry.  Neurological:     General: No focal deficit present.     Mental Status: He is alert and oriented to person, place, and time.  Psychiatric:        Mood and Affect: Mood normal.        Behavior: Behavior normal.        Thought Content: Thought content normal.        Judgment: Judgment normal.      Most recent functional status assessment:    08/06/2022    8:47 AM  In your present state of health, do you have any difficulty performing the following activities:  Hearing? 1  Vision? 1  Difficulty  concentrating or making decisions? 0  Walking or climbing stairs? 0  Dressing or bathing? 0  Doing errands, shopping? 0   Most recent fall risk assessment:    08/06/2022    8:46 AM  Fall Risk   Falls in the past year? 1  Number falls in past yr: 0  Injury with Fall? 1  Risk for fall due to : History of fall(s)  Follow up Falls evaluation completed    Most recent depression screenings:    08/06/2022    8:46 AM 02/05/2022    2:53 PM  PHQ 2/9 Scores  PHQ - 2 Score 0 0  PHQ- 9 Score 0 0   Most recent cognitive screening:    10/04/2020   10:30 AM  6CIT Screen  What Year? 0 points  What month? 0 points  What time? 0 points  Count back from 20 0 points  Months in reverse 0 points  Repeat phrase 0 points  Total Score 0 points   Most recent Audit-C alcohol use screening    08/06/2022    8:46 AM  Alcohol Use Disorder Test (AUDIT)  1. How often do you have a drink containing alcohol? 0  2. How many drinks containing alcohol do you have on a typical day when you are drinking? 0  3. How often do you have six or more drinks on one occasion? 0  AUDIT-C Score 0   A score of 3 or more in women, and 4 or more in men indicates increased risk for alcohol abuse, EXCEPT if all of the points are from question 1   No results found for any visits on 08/06/22.  Assessment & Plan     Annual wellness visit done today including the all of the following: Reviewed patient's Family Medical History Reviewed and updated list of patient's medical providers Assessment of cognitive impairment was done Assessed patient's functional ability Established a written schedule for health screening [place] Completed and Reviewed  Exercise Activities and Dietary recommendations  Goals      DIET - REDUCE SALT INTAKE TO 2 GRAMS PER DAY OR LESS     Recommend decreasing amount of salt intake to 2 grams or less a day.      Prevent falls     Recommend to remove any items from the home  that may cause slips or trips.        Immunization History  Administered Date(s) Administered   Fluad Quad(high Dose 65+)  08/16/2020, 09/05/2021   Influenza Split 08/23/2008, 09/14/2009, 09/24/2010, 08/19/2011, 08/17/2012   Influenza Whole 10/02/2005   Influenza, High Dose Seasonal PF 09/27/2014, 09/13/2015, 08/23/2016, 08/28/2017, 08/10/2018, 08/04/2019   Influenza,inj,Quad PF,6+ Mos 08/07/2013   PFIZER(Purple Top)SARS-COV-2 Vaccination 01/06/2020, 01/27/2020   Pneumococcal Conjugate-13 04/17/2015   Pneumococcal Polysaccharide-23 08/23/2008   Td 10/02/2002   Tdap 04/17/2015   Zoster Recombinat (Shingrix) 09/28/2018, 12/15/2018    Health Maintenance  Topic Date Due   COVID-19 Vaccine (3 - Pfizer risk series) 02/24/2020   INFLUENZA VACCINE  07/02/2022   TETANUS/TDAP  04/16/2025   Pneumonia Vaccine 96+ Years old  Completed   Zoster Vaccines- Shingrix  Completed   HPV VACCINES  Aged Out     Discussed health benefits of physical activity, and encouraged him to engage in regular exercise appropriate for his age and condition.    1. Encounter for Medicare annual wellness exam Need to discuss power of attorney and MOST forms on next visit  2. Essential hypertension Excellent blood pressure control - Lipid panel - TSH - CBC w/Diff/Platelet - Comprehensive Metabolic Panel (CMET)  3. Pure hypercholesterolemia On atorvastatin 80 with LDL goal less than 70, last was 51 - Lipid panel - TSH - CBC w/Diff/Platelet - Comprehensive Metabolic Panel (CMET)  4. Anemia, unspecified type  - Lipid panel - TSH - CBC w/Diff/Platelet - Comprehensive Metabolic Panel (CMET)  5. Coronary artery disease involving autologous artery coronary bypass graft without angina pectoris All risk factors treated Aortic root dilatation followed by cardiology - Lipid panel - TSH - CBC w/Diff/Platelet - Comprehensive Metabolic Panel (CMET)  6. Chronic atrial fibrillation (HCC)  - Lipid panel -  TSH - CBC w/Diff/Platelet - Comprehensive Metabolic Panel (CMET)  7. Gastro-esophageal reflux disease without esophagitis  - Lipid panel - TSH - CBC w/Diff/Platelet - Comprehensive Metabolic Panel (CMET)  8. Vitamin B12 deficiency  - Vitamin B12 - Folate   No follow-ups on file.     I, Micheal Durie, MD, have reviewed all documentation for this visit. The documentation on 08/07/22 for the exam, diagnosis, procedures, and orders are all accurate and complete.    Ysabela Keisler Cranford Mon, MD  [facility] [phone] (phone) [phone] (fax)  [place]

## 2022-08-06 ENCOUNTER — Encounter: Payer: Self-pay | Admitting: Family Medicine

## 2022-08-06 ENCOUNTER — Ambulatory Visit (INDEPENDENT_AMBULATORY_CARE_PROVIDER_SITE_OTHER): Payer: Medicare Other | Admitting: Family Medicine

## 2022-08-06 VITALS — BP 133/64 | HR 85 | Resp 16 | Ht 70.0 in | Wt 189.0 lb

## 2022-08-06 DIAGNOSIS — K219 Gastro-esophageal reflux disease without esophagitis: Secondary | ICD-10-CM | POA: Diagnosis not present

## 2022-08-06 DIAGNOSIS — I10 Essential (primary) hypertension: Secondary | ICD-10-CM | POA: Diagnosis not present

## 2022-08-06 DIAGNOSIS — Z0000 Encounter for general adult medical examination without abnormal findings: Secondary | ICD-10-CM

## 2022-08-06 DIAGNOSIS — E7800 Pure hypercholesterolemia, unspecified: Secondary | ICD-10-CM

## 2022-08-06 DIAGNOSIS — I25810 Atherosclerosis of coronary artery bypass graft(s) without angina pectoris: Secondary | ICD-10-CM | POA: Diagnosis not present

## 2022-08-06 DIAGNOSIS — I482 Chronic atrial fibrillation, unspecified: Secondary | ICD-10-CM | POA: Diagnosis not present

## 2022-08-06 DIAGNOSIS — I7143 Infrarenal abdominal aortic aneurysm, without rupture: Secondary | ICD-10-CM

## 2022-08-06 DIAGNOSIS — E538 Deficiency of other specified B group vitamins: Secondary | ICD-10-CM

## 2022-08-06 DIAGNOSIS — I2510 Atherosclerotic heart disease of native coronary artery without angina pectoris: Secondary | ICD-10-CM | POA: Diagnosis not present

## 2022-08-06 DIAGNOSIS — D649 Anemia, unspecified: Secondary | ICD-10-CM | POA: Diagnosis not present

## 2022-08-07 DIAGNOSIS — I25810 Atherosclerosis of coronary artery bypass graft(s) without angina pectoris: Secondary | ICD-10-CM | POA: Diagnosis not present

## 2022-08-07 DIAGNOSIS — E538 Deficiency of other specified B group vitamins: Secondary | ICD-10-CM | POA: Diagnosis not present

## 2022-08-07 DIAGNOSIS — K219 Gastro-esophageal reflux disease without esophagitis: Secondary | ICD-10-CM | POA: Diagnosis not present

## 2022-08-07 DIAGNOSIS — D649 Anemia, unspecified: Secondary | ICD-10-CM | POA: Diagnosis not present

## 2022-08-07 DIAGNOSIS — I482 Chronic atrial fibrillation, unspecified: Secondary | ICD-10-CM | POA: Diagnosis not present

## 2022-08-07 DIAGNOSIS — E7800 Pure hypercholesterolemia, unspecified: Secondary | ICD-10-CM | POA: Diagnosis not present

## 2022-08-07 DIAGNOSIS — I10 Essential (primary) hypertension: Secondary | ICD-10-CM | POA: Diagnosis not present

## 2022-08-08 LAB — CBC WITH DIFFERENTIAL/PLATELET
Basophils Absolute: 0 10*3/uL (ref 0.0–0.2)
Basos: 1 %
EOS (ABSOLUTE): 0.2 10*3/uL (ref 0.0–0.4)
Eos: 3 %
Hematocrit: 39.6 % (ref 37.5–51.0)
Hemoglobin: 12.7 g/dL — ABNORMAL LOW (ref 13.0–17.7)
Immature Grans (Abs): 0 10*3/uL (ref 0.0–0.1)
Immature Granulocytes: 0 %
Lymphocytes Absolute: 3 10*3/uL (ref 0.7–3.1)
Lymphs: 50 %
MCH: 30.8 pg (ref 26.6–33.0)
MCHC: 32.1 g/dL (ref 31.5–35.7)
MCV: 96 fL (ref 79–97)
Monocytes Absolute: 0.7 10*3/uL (ref 0.1–0.9)
Monocytes: 12 %
Neutrophils Absolute: 2 10*3/uL (ref 1.4–7.0)
Neutrophils: 34 %
Platelets: 213 10*3/uL (ref 150–450)
RBC: 4.13 x10E6/uL — ABNORMAL LOW (ref 4.14–5.80)
RDW: 12.7 % (ref 11.6–15.4)
WBC: 6 10*3/uL (ref 3.4–10.8)

## 2022-08-08 LAB — COMPREHENSIVE METABOLIC PANEL
ALT: 13 IU/L (ref 0–44)
AST: 20 IU/L (ref 0–40)
Albumin/Globulin Ratio: 1.7 (ref 1.2–2.2)
Albumin: 4.3 g/dL (ref 3.7–4.7)
Alkaline Phosphatase: 131 IU/L — ABNORMAL HIGH (ref 44–121)
BUN/Creatinine Ratio: 11 (ref 10–24)
BUN: 12 mg/dL (ref 8–27)
Bilirubin Total: 1.2 mg/dL (ref 0.0–1.2)
CO2: 25 mmol/L (ref 20–29)
Calcium: 9.3 mg/dL (ref 8.6–10.2)
Chloride: 104 mmol/L (ref 96–106)
Creatinine, Ser: 1.12 mg/dL (ref 0.76–1.27)
Globulin, Total: 2.5 g/dL (ref 1.5–4.5)
Glucose: 100 mg/dL — ABNORMAL HIGH (ref 70–99)
Potassium: 4.8 mmol/L (ref 3.5–5.2)
Sodium: 142 mmol/L (ref 134–144)
Total Protein: 6.8 g/dL (ref 6.0–8.5)
eGFR: 66 mL/min/{1.73_m2} (ref >59)

## 2022-08-08 LAB — LIPID PANEL
Chol/HDL Ratio: 2.3 ratio (ref 0.0–5.0)
Cholesterol, Total: 82 mg/dL — ABNORMAL LOW (ref 100–199)
HDL: 35 mg/dL — ABNORMAL LOW (ref >39)
LDL Chol Calc (NIH): 32 mg/dL (ref 0–99)
Triglycerides: 69 mg/dL (ref 0–149)
VLDL Cholesterol Cal: 15 mg/dL (ref 5–40)

## 2022-08-08 LAB — TSH: TSH: 1.24 u[IU]/mL (ref 0.450–4.500)

## 2022-08-08 LAB — VITAMIN B12: Vitamin B-12: 399 pg/mL (ref 232–1245)

## 2022-08-08 LAB — FOLATE: Folate: 8 ng/mL (ref >3.0)

## 2022-08-12 ENCOUNTER — Other Ambulatory Visit (HOSPITAL_COMMUNITY): Payer: Self-pay | Admitting: Internal Medicine

## 2022-08-14 DIAGNOSIS — H903 Sensorineural hearing loss, bilateral: Secondary | ICD-10-CM | POA: Diagnosis not present

## 2022-08-14 DIAGNOSIS — H6123 Impacted cerumen, bilateral: Secondary | ICD-10-CM | POA: Diagnosis not present

## 2022-08-14 DIAGNOSIS — H6063 Unspecified chronic otitis externa, bilateral: Secondary | ICD-10-CM | POA: Diagnosis not present

## 2022-08-28 ENCOUNTER — Ambulatory Visit (INDEPENDENT_AMBULATORY_CARE_PROVIDER_SITE_OTHER): Payer: Medicare Other

## 2022-08-28 DIAGNOSIS — Z23 Encounter for immunization: Secondary | ICD-10-CM | POA: Diagnosis not present

## 2022-09-02 DIAGNOSIS — K5909 Other constipation: Secondary | ICD-10-CM | POA: Diagnosis not present

## 2022-09-02 DIAGNOSIS — Z86010 Personal history of colonic polyps: Secondary | ICD-10-CM | POA: Diagnosis not present

## 2022-09-02 DIAGNOSIS — K219 Gastro-esophageal reflux disease without esophagitis: Secondary | ICD-10-CM | POA: Diagnosis not present

## 2022-09-05 DIAGNOSIS — H903 Sensorineural hearing loss, bilateral: Secondary | ICD-10-CM | POA: Diagnosis not present

## 2022-09-19 ENCOUNTER — Telehealth (HOSPITAL_COMMUNITY): Payer: Self-pay | Admitting: *Deleted

## 2022-09-19 NOTE — Telephone Encounter (Signed)
Received fax from [facility], pt sch for colonoscopy on 11/13/22 and needs clearance for procedure and to hold Eliquis prior  Per Dr Haroldine Laws: cleared for procedure, ok to hold Eliquis for 2 days

## 2022-09-26 DIAGNOSIS — H903 Sensorineural hearing loss, bilateral: Secondary | ICD-10-CM | POA: Diagnosis not present

## 2022-09-30 ENCOUNTER — Encounter (INDEPENDENT_AMBULATORY_CARE_PROVIDER_SITE_OTHER): Payer: Self-pay

## 2022-11-13 ENCOUNTER — Ambulatory Visit: Payer: Medicare Other | Admitting: Certified Registered"

## 2022-11-13 ENCOUNTER — Ambulatory Visit
Admission: RE | Admit: 2022-11-13 | Discharge: 2022-11-13 | Disposition: A | Discharge status: Home / Self Care | Payer: Medicare Other | Source: Ambulatory Visit | Attending: Internal Medicine | Admitting: Internal Medicine

## 2022-11-13 ENCOUNTER — Other Ambulatory Visit: Payer: Self-pay

## 2022-11-13 ENCOUNTER — Encounter: Payer: Self-pay | Admitting: Internal Medicine

## 2022-11-13 ENCOUNTER — Encounter
Admission: RE | Disposition: A | Discharge status: Home / Self Care | Payer: Self-pay | Source: Ambulatory Visit | Attending: Internal Medicine

## 2022-11-13 DIAGNOSIS — Z7901 Long term (current) use of anticoagulants: Secondary | ICD-10-CM | POA: Diagnosis not present

## 2022-11-13 DIAGNOSIS — Z951 Presence of aortocoronary bypass graft: Secondary | ICD-10-CM | POA: Insufficient documentation

## 2022-11-13 DIAGNOSIS — I509 Heart failure, unspecified: Secondary | ICD-10-CM | POA: Insufficient documentation

## 2022-11-13 DIAGNOSIS — K640 First degree hemorrhoids: Secondary | ICD-10-CM | POA: Insufficient documentation

## 2022-11-13 DIAGNOSIS — Z86010 Personal history of colonic polyps: Secondary | ICD-10-CM | POA: Diagnosis not present

## 2022-11-13 DIAGNOSIS — I110 Hypertensive heart disease with heart failure: Secondary | ICD-10-CM | POA: Diagnosis not present

## 2022-11-13 DIAGNOSIS — K648 Other hemorrhoids: Secondary | ICD-10-CM | POA: Diagnosis not present

## 2022-11-13 DIAGNOSIS — I2510 Atherosclerotic heart disease of native coronary artery without angina pectoris: Secondary | ICD-10-CM | POA: Insufficient documentation

## 2022-11-13 DIAGNOSIS — I482 Chronic atrial fibrillation, unspecified: Secondary | ICD-10-CM | POA: Diagnosis not present

## 2022-11-13 DIAGNOSIS — Z87891 Personal history of nicotine dependence: Secondary | ICD-10-CM | POA: Diagnosis not present

## 2022-11-13 DIAGNOSIS — D120 Benign neoplasm of cecum: Secondary | ICD-10-CM | POA: Diagnosis not present

## 2022-11-13 DIAGNOSIS — K635 Polyp of colon: Secondary | ICD-10-CM | POA: Diagnosis not present

## 2022-11-13 HISTORY — PX: COLONOSCOPY WITH PROPOFOL: SHX5780

## 2022-11-13 SURGERY — COLONOSCOPY WITH PROPOFOL
Anesthesia: General

## 2022-11-13 MED ORDER — PROPOFOL 10 MG/ML IV BOLUS
INTRAVENOUS | Status: DC | PRN
  Administered 2022-11-13: 50 mg via INTRAVENOUS

## 2022-11-13 MED ORDER — PROPOFOL 500 MG/50ML IV EMUL
INTRAVENOUS | Status: DC | PRN
  Administered 2022-11-13: 150 ug/kg/min via INTRAVENOUS

## 2022-11-13 MED ORDER — PHENYLEPHRINE 80 MCG/ML (10ML) SYRINGE FOR IV PUSH (FOR BLOOD PRESSURE SUPPORT)
PREFILLED_SYRINGE | INTRAVENOUS | Status: DC | PRN
  Administered 2022-11-13 (×4): 40 ug via INTRAVENOUS

## 2022-11-13 MED ORDER — GLYCOPYRROLATE 0.2 MG/ML IJ SOLN
INTRAMUSCULAR | Status: AC
  Filled 2022-11-13: qty 1

## 2022-11-13 MED ORDER — SODIUM CHLORIDE 0.9 % IV SOLN: INTRAVENOUS | Status: DC

## 2022-11-13 MED ORDER — LIDOCAINE HCL (CARDIAC) PF 100 MG/5ML IV SOSY
PREFILLED_SYRINGE | INTRAVENOUS | Status: DC | PRN
  Administered 2022-11-13 (×2): 40 mg via INTRAVENOUS

## 2022-11-13 MED ORDER — LIDOCAINE HCL (PF) 2 % IJ SOLN
INTRAMUSCULAR | Status: AC
  Filled 2022-11-13: qty 5

## 2022-11-13 MED ORDER — PROPOFOL 10 MG/ML IV BOLUS
INTRAVENOUS | Status: AC
  Filled 2022-11-13: qty 40

## 2022-11-13 MED ORDER — PHENYLEPHRINE 80 MCG/ML (10ML) SYRINGE FOR IV PUSH (FOR BLOOD PRESSURE SUPPORT)
PREFILLED_SYRINGE | INTRAVENOUS | Status: AC
  Filled 2022-11-13: qty 10

## 2022-11-13 NOTE — Interval H&P Note (Signed)
History and Physical Interval Note:  11/13/2022 10:36 AM  Micheal Wright  has presented today for surgery, with the diagnosis of H TA POlyps.  The various methods of treatment have been discussed with the patient and family. After consideration of risks, benefits and other options for treatment, the patient has consented to  Procedure(s): COLONOSCOPY WITH PROPOFOL (N/A) as a surgical intervention.  The patient's history has been reviewed, patient examined, no change in status, stable for surgery.  I have reviewed the patient's chart and labs.  Questions were answered to the patient's satisfaction.     [place], [place]

## 2022-11-13 NOTE — Transfer of Care (Signed)
Immediate Anesthesia Transfer of Care Note  Patient: Micheal Wright  Procedure(s) Performed: COLONOSCOPY WITH PROPOFOL  Patient Location: PACU  Anesthesia Type:General  Level of Consciousness: drowsy  Airway & Oxygen Therapy: Patient Spontanous Breathing  Post-op Assessment: Report given to RN and Post -op Vital signs reviewed and stable  Post vital signs: Reviewed and stable  Last Vitals:  Vitals Value Taken Time  BP    Temp    Pulse 78 11/13/22 1124  Resp 12 11/13/22 1124  SpO2 100 % 11/13/22 1124  Vitals shown include unvalidated device data.  Last Pain:  Vitals:   11/13/22 1036  TempSrc: Temporal  PainSc: 0-No pain         Complications: No notable events documented.

## 2022-11-13 NOTE — H&P (Signed)
Outpatient short stay form Pre-procedure 11/13/2022 10:32 AM Apolonio Cutting K. Alice Reichert, M.D.  Primary Physician: Viviana Simpler, M.D.  Reason for visit:  Personal history  of colon polyps (Tubular adenomas) 02/01/2019: CSY (Indic: PHx & FHx polyps) - Very tortuous colon, passed scope into proximal ascending colon and could see IC valve but could not advance all the way to the cecum. Small TA and ascending colon. Small TA at hepatic flexure. No repeat recommended.   History of present illness:  81 y/o male presents for polyp surveillance. He has a history of numerous colon polyps and is interested in repeating colonoscopy acknowledging his advanced age and that this may be his last procedure.    Current Facility-Administered Medications:    0.9 %  sodium chloride infusion, , Intravenous, Continuous, Gilma Bessette, Benay Pike, MD  Medications Prior to Admission  Medication Sig Dispense Refill Last Dose   amLODipine (NORVASC) 5 MG tablet TAKE 1 TABLET BY MOUTH ONCE A DAY 90 tablet 3 11/12/2022   atorvastatin (LIPITOR) 80 MG tablet TAKE 1 TABLET BY MOUTH DAILY 90 tablet 3 11/12/2022   ELIQUIS 5 MG TABS tablet TAKE 1 TABLET BY MOUTH TWICE A DAY 180 tablet 3 11/10/2022 at 1800   losartan (COZAAR) 50 MG tablet TAKE 1 TABLET BY MOUTH ONCE A DAY 90 tablet 3 11/12/2022   omeprazole (PRILOSEC) 40 MG capsule TAKE 1 CAPSULE BY MOUTH ONCE DAILY 90 capsule 0 11/12/2022   hydrochlorothiazide (MICROZIDE) 12.5 MG capsule TAKE 1 CAPSULE BY MOUTH ONCE DAILY 90 capsule 3    nitroGLYCERIN (NITROSTAT) 0.4 MG SL tablet DISSOLVE 1 TABLET UNDER THE TONGUE FOR CHEST PAIN. MAY REPEAT EVERY 5MINUTES UP TO 3 DOSES. 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    Atrial complex, premature 05/04/2014   CAD  (coronary artery disease)    s/p CABG 1998; Last cath 2002 in setting of marked ST elevation during stress testing. patent grafts; Echo 8/09 EF 55%. No significant valvular disease.   Cardiac murmur 09/03/2011   Overview:  Overview:  Last Assessment & Plan:  Likely mild aortic valve sclerosis. Will check echo.     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 Due for repeat u/s.  Overview:  Overview:  Qualifier: Diagnosis of  By: Haroldine Laws, MD, Eileen Stanford  Last Assessment & Plan:  Asymptomatic. Due for repeat u/s.     Carotid stenosis 08/22/2009   Qualifier: Diagnosis of  By: Haroldine Laws, MD, Eileen Stanford    Cerebrovascular disease 11/17/2008   Chest tightness 01/20/2015   CHF (congestive heart failure) (HCC)    Chronic atrial fibrillation ([place]) 03/02/2018   Coronary artery disease 05/11/2007   Qualifier: Diagnosis of  By: Lurlean Nanny LPN, Rollene Fare     Erectile dysfunction    Essential hypertension 05/11/2007   Overview:  Overview:  Overview:  Qualifier: Diagnosis of  By: Lurlean Nanny LPN, [place]   Last Assessment & Plan:  BP up slightly here but when he checks at [employer] usually 120/65. Will continue current regimen.  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    Physical Exam  Gen:  Alert, oriented. Appears stated age.  HEENT: [place]/AT. PERRLA. Lungs: CTA, no wheezes. CV: RR nl S1, S2. Abd: soft, benign, no masses. BS+ Ext: No edema. Pulses 2+    Planned procedures: Proceed with colonoscopy. The patient understands the nature of the planned procedure, indications, risks, alternatives and potential complications including but not limited to bleeding, infection, perforation, damage to internal organs and possible oversedation/side effects from anesthesia. The patient agrees and gives consent to proceed.  Please refer to procedure notes for findings, recommendations and patient disposition/instructions.     Cynthya Yam K. Alice Reichert, M.D. Gastroenterology 11/13/2022  10:32 AM

## 2022-11-13 NOTE — Anesthesia Preprocedure Evaluation (Signed)
Anesthesia Evaluation  Patient identified by MRN, date of birth, ID band Patient awake    Reviewed: Allergy & Precautions, H&P , NPO status , Patient's Chart, lab work & pertinent test results  History of Anesthesia Complications Negative for: history of anesthetic complications  Airway Mallampati: III  TM Distance: >3 FB Neck ROM: full    Dental  (+) Lower Dentures, Upper Dentures   Pulmonary sleep apnea , neg COPD, Patient abstained from smoking.Not current smoker, former smoker   Pulmonary exam normal breath sounds clear to auscultation       Cardiovascular Exercise Tolerance: Good METShypertension, + CAD, + CABG (1998), + Peripheral Vascular Disease and +CHF  (-) Past MI + dysrhythmias Atrial Fibrillation  Rhythm:irregular Rate:Abnormal - Systolic murmurs TTE 3614  1. No definite wall motion abnormalities though endocardium is difficult  to see, esp of anterior wall. . Left ventricular ejection fraction, by  estimation, is 60 to 65%. The left ventricle has normal function. Left  ventricular diastolic parameters are  indeterminate.   2. Right ventricular systolic function is low normal. The right  ventricular size is normal. There is normal pulmonary artery systolic  pressure.   3. Right atrial size was mildly dilated.   4. The mitral valve is normal in structure. Trivial mitral valve  regurgitation.   5. The aortic valve is tricuspid. Aortic valve regurgitation is trivial.  Mild to moderate aortic valve sclerosis/calcification is present, without  any evidence of aortic stenosis.   6. The inferior vena cava is dilated in size with <50% respiratory  variability, suggesting right atrial pressure of 15 mmHg.     Neuro/Psych  negative psych ROS   GI/Hepatic ,GERD  ,,(+)     (-) substance abuse    Endo/Other  neg diabetes    Renal/GU negative Renal ROS     Musculoskeletal  (+) Arthritis ,    Abdominal    Peds  Hematology  (+) Blood dyscrasia, anemia   Anesthesia Other Findings Past Medical History: 05/04/2014: APC (atrial premature contractions) 03/02/2018: Ascending aorta dilation ([place]) 11/08/2008: Atherosclerosis of coronary artery     Comment:  All risk factors treated.  05/04/2014: Atrial complex, premature No date: CAD (coronary artery disease)     Comment:  s/p CABG 1998; Last cath 2002 in setting of marked ST               elevation during stress testing. patent grafts; Echo 8/09              EF 55%. No significant valvular disease. 09/03/2011: Cardiac murmur     Comment:  Overview:  Overview:  Last Assessment & Plan:  Likely               mild aortic valve sclerosis. Will check echo.   No date: Carotid artery disease ([place])     Comment:  u/s 9/10: R 40-59% L 0-39% 08/22/2009: Carotid artery obstruction     Comment:  Overview:  Overview:  Qualifier: Diagnosis of  By:               Haroldine Laws, MD, Eileen Stanford  Last Assessment & Plan:                Asymptomatic. Due for repeat u/s.  Overview:  Overview:                Qualifier: Diagnosis of  By: Haroldine Laws, MD, Clois Comber  R  Last Assessment & Plan:  Asymptomatic. Due for repeat               u/s.   08/22/2009: Carotid stenosis     Comment:  Qualifier: Diagnosis of  By: Haroldine Laws, MD, Eileen Stanford  11/17/2008: Cerebrovascular disease 01/20/2015: Chest tightness No date: CHF (congestive heart failure) ([place]) 03/02/2018: Chronic atrial fibrillation ([place]) 05/11/2007: Coronary artery disease     Comment:  Qualifier: Diagnosis of  By: Lurlean Nanny LPN, [place]   No date: Erectile dysfunction 05/11/2007: Essential hypertension     Comment:  Overview:  Overview:  Overview:  Qualifier: Diagnosis of              By: Lurlean Nanny LPN, [place]   Last Assessment & Plan:  BP up               slightly here but when he checks at [employer] usually               120/65. Will continue current regimen.  Qualifier:                Diagnosis of  By: Lurlean Nanny LPN, [place]    No date: Family history of breast cancer No date: Family history of colon cancer No date: Family history of colonic polyps No date: GERD (gastroesophageal reflux disease) No date: History of colonic polyps 11/08/2008: Hypercholesteremia No date: Hyperlipidemia No date: Hypertension 09/03/2011: Murmur, cardiac No date: Neuropathy     Comment:  feet 05/04/2014: PAC (premature atrial contraction) 01/02/2010: Postsurgical aortocoronary bypass status No date: Sleep apnea No date: Wears dentures     Comment:  Full upper, partial lower No date: Wears hearing aid in both ears  Reproductive/Obstetrics                              Anesthesia Physical Anesthesia Plan  ASA: 3  Anesthesia Plan: General   Post-op Pain Management: Minimal or no pain anticipated   Induction: Intravenous  PONV Risk Score and Plan: 2 and TIVA, Treatment may vary due to age or medical condition and Ondansetron  Airway Management Planned: Natural Airway and Nasal Cannula  Additional Equipment: None  Intra-op Plan:   Post-operative Plan:   Informed Consent: I have reviewed the patients History and Physical, chart, labs and discussed the procedure including the risks, benefits and alternatives for the proposed anesthesia with the patient or authorized representative who has indicated his/her understanding and acceptance.     Dental Advisory Given  Plan Discussed with: CRNA  Anesthesia Plan Comments: (Discussed risks of anesthesia with patient, including possibility of difficulty with spontaneous ventilation under anesthesia necessitating airway intervention, PONV, and rare risks such as cardiac or respiratory or neurological events, and allergic reactions. Discussed the role of CRNA in patient's perioperative care. Patient understands.)         Anesthesia Quick Evaluation

## 2022-11-13 NOTE — Op Note (Signed)
[facility] Gastroenterology Patient Name: Micheal Wright Procedure Date: 11/13/2022 10:34 AM MRN: [id number] Account #: [id number] Date of Birth: 01-20-1941 Admit Type: Outpatient Age: 81 Room: [facility] ENDO ROOM 2 Gender: Male Note Status: Finalized Instrument Name: Jasper Riling [id number] Procedure:             Colonoscopy Providers:             Benay Pike. Jenette Rayson MD, MD Referring MD:          none Medicines:             Propofol per Anesthesia Complications:         No immediate complications. Procedure:             Pre-Anesthesia Assessment:                        - The risks and benefits of the procedure and the                         sedation options and risks were discussed with the                         patient. All questions were answered and informed                         consent was obtained.                        - Patient identification and proposed procedure were                         verified prior to the procedure by the nurse. The                         procedure was verified in the procedure room.                        - ASA Grade Assessment: III - A patient with severe                         systemic disease.                        - After reviewing the risks and benefits, the patient                         was deemed in satisfactory condition to undergo the                         procedure.                        After obtaining informed consent, the colonoscope was                         passed under direct vision. Throughout the procedure,                         the patient's blood pressure, pulse, and oxygen  saturations were monitored continuously. The                         Colonoscope was introduced through the anus and                         advanced to the the cecum, identified by appendiceal                         orifice and ileocecal valve. The colonoscopy was                         somewhat difficult  due to a redundant colon and                         significant looping. Successful completion of the                         procedure was aided by applying abdominal pressure.                         The patient tolerated the procedure well. The quality                         of the bowel preparation was adequate. The ileocecal                         valve, appendiceal orifice, and rectum were                         photographed. Findings:      The perianal and digital rectal examinations were normal. Pertinent       negatives include normal sphincter tone and no palpable rectal lesions.      Non-bleeding internal hemorrhoids were found during retroflexion. The       hemorrhoids were Grade I (internal hemorrhoids that do not prolapse).      A 14 mm polyp was found in the cecum. The polyp was multi-lobulated and       sessile. The polyp was removed with a piecemeal technique using a hot       snare. Resection and retrieval were complete. To prevent bleeding after       the polypectomy, one hemostatic clip was successfully placed (MR       conditional). Clip manufacturer: [employer]. There was no       bleeding during, or at the end, of the procedure.      The exam was otherwise without abnormality. Impression:            - Non-bleeding internal hemorrhoids.                        - One 14 mm polyp in the cecum, removed piecemeal                         using a hot snare. Resected and retrieved. Clip (MR                         conditional) was placed. Clip manufacturer: [employer].                        - The examination was otherwise normal. Recommendation:        - Patient has a contact number available for                         emergencies. The signs and symptoms of potential                         delayed complications were discussed with the patient.                         Return to normal activities tomorrow. Written                          discharge instructions were provided to the patient.                        - Resume previous diet.                        - Continue present medications.                        - If polyps are benign or adenomatous without                         dysplasia, I will advise NO further colonoscopy due to                         advanced age and/or severe comorbidity.                        - Return to GI office PRN.                        - Resume Eliquis (apixaban) at prior dose tomorrow.                         Refer to managing physician for further adjustment of                         therapy.                        - The findings and recommendations were discussed with                         the patient. Procedure Code(s):     --- Professional ---                        [phone], Colonoscopy, flexible; with removal of                         tumor(s), polyp(s), or other lesion(s) by snare                         technique Diagnosis Code(s):     --- Professional ---  K64.0, First degree hemorrhoids                        D12.0, Benign neoplasm of cecum CPT copyright 2022 American Medical Association. All rights reserved. The codes documented in this report are preliminary and upon coder review may  be revised to meet current compliance requirements. Efrain Sella MD, MD 11/13/2022 11:26:51 AM This report has been signed electronically. Number of Addenda: 0 Note Initiated On: 11/13/2022 10:34 AM Scope Withdrawal Time: 0 hours 10 minutes 10 seconds  Total Procedure Duration: 0 hours 21 minutes 56 seconds  Estimated Blood Loss:  Estimated blood loss: none.      [facility]

## 2022-11-13 NOTE — Anesthesia Postprocedure Evaluation (Signed)
Anesthesia Post Note  Patient: Micheal Wright  Procedure(s) Performed: COLONOSCOPY WITH PROPOFOL  Patient location during evaluation: Endoscopy Anesthesia Type: General Level of consciousness: awake and alert Pain management: pain level controlled Vital Signs Assessment: post-procedure vital signs reviewed and stable Respiratory status: spontaneous breathing, nonlabored ventilation, respiratory function stable and patient connected to nasal cannula oxygen Cardiovascular status: blood pressure returned to baseline and stable Postop Assessment: no apparent nausea or vomiting Anesthetic complications: no   No notable events documented.   Last Vitals:  Vitals:   11/13/22 1132 11/13/22 1144  BP: 118/76 106/78  Pulse:    Resp:    Temp:    SpO2:      Last Pain:  Vitals:   11/13/22 1144  TempSrc:   PainSc: 0-No pain                 Arita Miss

## 2022-11-14 ENCOUNTER — Encounter: Payer: Self-pay | Admitting: Internal Medicine

## 2022-11-14 LAB — SURGICAL PATHOLOGY

## 2022-12-16 DIAGNOSIS — M3501 Sicca syndrome with keratoconjunctivitis: Secondary | ICD-10-CM | POA: Diagnosis not present

## 2022-12-16 DIAGNOSIS — Z961 Presence of intraocular lens: Secondary | ICD-10-CM | POA: Diagnosis not present

## 2023-01-07 ENCOUNTER — Other Ambulatory Visit (INDEPENDENT_AMBULATORY_CARE_PROVIDER_SITE_OTHER): Payer: Self-pay | Admitting: Vascular Surgery

## 2023-01-07 DIAGNOSIS — I6523 Occlusion and stenosis of bilateral carotid arteries: Secondary | ICD-10-CM

## 2023-01-08 DIAGNOSIS — R051 Acute cough: Secondary | ICD-10-CM | POA: Diagnosis not present

## 2023-01-08 DIAGNOSIS — J101 Influenza due to other identified influenza virus with other respiratory manifestations: Secondary | ICD-10-CM | POA: Diagnosis not present

## 2023-01-08 DIAGNOSIS — R059 Cough, unspecified: Secondary | ICD-10-CM | POA: Diagnosis not present

## 2023-01-08 DIAGNOSIS — R509 Fever, unspecified: Secondary | ICD-10-CM | POA: Diagnosis not present

## 2023-01-08 DIAGNOSIS — J189 Pneumonia, unspecified organism: Secondary | ICD-10-CM | POA: Diagnosis not present

## 2023-01-10 ENCOUNTER — Encounter (INDEPENDENT_AMBULATORY_CARE_PROVIDER_SITE_OTHER): Payer: Medicare Other

## 2023-01-10 ENCOUNTER — Ambulatory Visit (INDEPENDENT_AMBULATORY_CARE_PROVIDER_SITE_OTHER): Payer: Medicare Other | Admitting: Vascular Surgery

## 2023-01-15 DIAGNOSIS — D485 Neoplasm of uncertain behavior of skin: Secondary | ICD-10-CM | POA: Diagnosis not present

## 2023-01-15 DIAGNOSIS — L821 Other seborrheic keratosis: Secondary | ICD-10-CM | POA: Diagnosis not present

## 2023-01-15 DIAGNOSIS — D2272 Melanocytic nevi of left lower limb, including hip: Secondary | ICD-10-CM | POA: Diagnosis not present

## 2023-01-15 DIAGNOSIS — L570 Actinic keratosis: Secondary | ICD-10-CM | POA: Diagnosis not present

## 2023-01-15 DIAGNOSIS — D2261 Melanocytic nevi of right upper limb, including shoulder: Secondary | ICD-10-CM | POA: Diagnosis not present

## 2023-01-15 DIAGNOSIS — D2271 Melanocytic nevi of right lower limb, including hip: Secondary | ICD-10-CM | POA: Diagnosis not present

## 2023-01-15 DIAGNOSIS — D225 Melanocytic nevi of trunk: Secondary | ICD-10-CM | POA: Diagnosis not present

## 2023-01-15 DIAGNOSIS — D0461 Carcinoma in situ of skin of right upper limb, including shoulder: Secondary | ICD-10-CM | POA: Diagnosis not present

## 2023-01-15 DIAGNOSIS — D2262 Melanocytic nevi of left upper limb, including shoulder: Secondary | ICD-10-CM | POA: Diagnosis not present

## 2023-02-11 DIAGNOSIS — M25572 Pain in left ankle and joints of left foot: Secondary | ICD-10-CM | POA: Diagnosis not present

## 2023-02-11 DIAGNOSIS — M25472 Effusion, left ankle: Secondary | ICD-10-CM | POA: Diagnosis not present

## 2023-02-11 DIAGNOSIS — S90512A Abrasion, left ankle, initial encounter: Secondary | ICD-10-CM | POA: Diagnosis not present

## 2023-02-11 DIAGNOSIS — M79672 Pain in left foot: Secondary | ICD-10-CM | POA: Diagnosis not present

## 2023-02-13 DIAGNOSIS — H6123 Impacted cerumen, bilateral: Secondary | ICD-10-CM | POA: Diagnosis not present

## 2023-02-13 DIAGNOSIS — H903 Sensorineural hearing loss, bilateral: Secondary | ICD-10-CM | POA: Diagnosis not present

## 2023-02-13 DIAGNOSIS — H6063 Unspecified chronic otitis externa, bilateral: Secondary | ICD-10-CM | POA: Diagnosis not present

## 2023-02-18 DIAGNOSIS — M25572 Pain in left ankle and joints of left foot: Secondary | ICD-10-CM | POA: Diagnosis not present

## 2023-02-18 DIAGNOSIS — S9002XD Contusion of left ankle, subsequent encounter: Secondary | ICD-10-CM | POA: Diagnosis not present

## 2023-02-19 DIAGNOSIS — K5909 Other constipation: Secondary | ICD-10-CM | POA: Diagnosis not present

## 2023-03-19 DIAGNOSIS — D0461 Carcinoma in situ of skin of right upper limb, including shoulder: Secondary | ICD-10-CM | POA: Diagnosis not present

## 2023-04-11 DIAGNOSIS — H43813 Vitreous degeneration, bilateral: Secondary | ICD-10-CM | POA: Diagnosis not present

## 2023-04-11 DIAGNOSIS — M3501 Sicca syndrome with keratoconjunctivitis: Secondary | ICD-10-CM | POA: Diagnosis not present

## 2023-05-20 ENCOUNTER — Telehealth: Payer: Self-pay | Admitting: Family Medicine

## 2023-05-20 NOTE — Telephone Encounter (Signed)
Tonya advised that mr. Oppy is no longer a patient here. And Dr. Sullivan Lone is at a new office.

## 2023-05-20 NOTE — Telephone Encounter (Signed)
Tonya from [employer] called in says faxed lab req today , calling to see if this has been received. Please call back

## 2023-05-28 DIAGNOSIS — H6123 Impacted cerumen, bilateral: Secondary | ICD-10-CM | POA: Diagnosis not present

## 2023-05-28 DIAGNOSIS — H6063 Unspecified chronic otitis externa, bilateral: Secondary | ICD-10-CM | POA: Diagnosis not present

## 2023-05-28 DIAGNOSIS — H903 Sensorineural hearing loss, bilateral: Secondary | ICD-10-CM | POA: Diagnosis not present

## 2023-06-04 DIAGNOSIS — I482 Chronic atrial fibrillation, unspecified: Secondary | ICD-10-CM | POA: Diagnosis not present

## 2023-06-04 DIAGNOSIS — G6189 Other inflammatory polyneuropathies: Secondary | ICD-10-CM | POA: Diagnosis not present

## 2023-06-27 ENCOUNTER — Ambulatory Visit (INDEPENDENT_AMBULATORY_CARE_PROVIDER_SITE_OTHER): Payer: Medicare Other

## 2023-06-27 ENCOUNTER — Encounter: Payer: Self-pay | Admitting: Podiatry

## 2023-06-27 ENCOUNTER — Ambulatory Visit (INDEPENDENT_AMBULATORY_CARE_PROVIDER_SITE_OTHER): Payer: Medicare Other | Admitting: Podiatry

## 2023-06-27 VITALS — BP 135/63 | HR 46

## 2023-06-27 DIAGNOSIS — M2041 Other hammer toe(s) (acquired), right foot: Secondary | ICD-10-CM

## 2023-06-27 NOTE — Progress Notes (Signed)
Chief Complaint  Patient presents with   Toe Pain    "It's my right foot, my little toe, on the outside and I have a place on the inside." N - painful little toe L - 5th digit right D - 50 years - innterdigital, lateral side - Father's Day O - gradually worse interdigital, suddenly had a pedicure - lateral C - sore, aches A - shoe on T - cotton in between toes, Vaseline    Subjective: 82 y.o. male presenting to the office today as a new patient for evaluation of pain and tenderness associated to the lateral aspect of the right fifth toe.  Patient states that he received a pedicure and his specialist was aggressive in shaving the callus to the right fifth toe.  He has had some pain and tenderness ever since.   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    Atrial complex, premature 05/04/2014   CAD (coronary artery disease)    s/p CABG 1998; Last cath 2002 in setting of marked ST elevation during stress testing. patent grafts; Echo 8/09 EF 55%. No significant valvular disease.   Cardiac murmur 09/03/2011   Overview:  Overview:  Last Assessment & Plan:  Likely mild aortic valve sclerosis. Will check echo.     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 Due for repeat u/s.  Overview:  Overview:  Qualifier: Diagnosis of  By: Gala Romney, MD, Trixie Dredge  Last Assessment & Plan:  Asymptomatic. Due for repeat u/s.     Carotid stenosis 08/22/2009   Qualifier: Diagnosis of  By: Gala Romney, MD, Trixie Dredge    Cerebrovascular disease 11/17/2008   Chest tightness 01/20/2015   CHF (congestive heart failure) (HCC)    Chronic atrial fibrillation (HCC)  03/02/2018   Coronary artery disease 05/11/2007   Qualifier: Diagnosis of  By: Pasty Arch LPN, Rene Kocher     Erectile dysfunction    Essential hypertension 05/11/2007   Overview:  Overview:  Overview:  Qualifier: Diagnosis of  By: Pasty Arch LPN, Regina   Last Assessment & Plan:  BP up slightly here but when he checks [employer] usually 120/65. Will continue current regimen.  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   Sleep apnea    Wears dentures    Full upper, partial lower   Wears hearing aid in both ears     Past Surgical History:  Procedure Laterality Date   ANGIOPLASTY     x 2   APPENDECTOMY  1960's   CARDIAC CATHETERIZATION  2002   CARDIOVERSION N/A 11/14/2015   Procedure: CARDIOVERSION;  Surgeon: Dolores Patty, MD;  Location: [facility] ENDOSCOPY;  Service: Cardiovascular;  Laterality:  N/A;   CATARACT EXTRACTION W/PHACO Left 04/30/2022   Procedure: CATARACT EXTRACTION PHACO AND INTRAOCULAR LENS PLACEMENT (IOC) LEFT;  Surgeon: Galen Manila, MD;  Location: [facility];  Service: Ophthalmology;  Laterality: Left;  12.60 1:11.2   CATARACT EXTRACTION W/PHACO Right 05/14/2022   Procedure: CATARACT EXTRACTION PHACO AND INTRAOCULAR LENS PLACEMENT (IOC) RIGHT 12.30 00:59.3;  Surgeon: Galen Manila, MD;  Location: [facility] SUR[facility]ERY [facility];  Service: Ophthalmology;  Laterality: Right;   COLONOSCOPY W/ POLYPECTOMY     COLONOSCOPY WITH PROPOFOL N/A 11/13/2015   Procedure: COLONOSCOPY WITH PROPOFOL;  Surgeon: Scot Jun, MD;  Location: [facility] ENDOSCOPY;  Service: Endoscopy;  Laterality:  N/A;   COLONOSCOPY WITH PROPOFOL N/A 02/01/2019   Procedure: COLONOSCOPY WITH PROPOFOL;  Surgeon: Scot Jun, MD;  Location: [facility] ENDOSCOPY;  Service: Endoscopy;  Laterality: N/A;   COLONOSCOPY WITH PROPOFOL N/A 11/13/2022   Procedure: COLONOSCOPY WITH PROPOFOL;  Surgeon: Toledo, Boykin Nearing, MD;  Location: [facility] ENDOSCOPY;  Service: Gastroenterology;  Laterality: N/A;   CORONARY ARTERY BYPASS GRAFT  1998   ESOPHAGOGASTRODUODENOSCOPY  04/2004   Inflam. at Z line (biopsy neg)   ESOPHAGOGASTRODUODENOSCOPY N/A 02/01/2019   Procedure: ESOPHAGOGASTRODUODENOSCOPY (EGD);  Surgeon: Scot Jun, MD;  Location: [facility] ENDOSCOPY;  Service: Endoscopy;  Laterality: N/A;   HERPES SIMPLEX VIRUS DFA N/A    KNEE ARTHROSCOPY W/ PARTIAL MEDIAL MENISCECTOMY  1990s   Left knee   MRI     sleep study     UVULOPALATOPHARYNGOPLASTY      No Known Allergies   Objective:  Physical Exam General: Alert and oriented x3 in no acute distress  Dermatology: Hyperkeratotic lesion(s) present on the lateral aspect of the right fifth digit. Pain on palpation with a central nucleated core noted. Skin is warm, dry and supple bilateral lower extremities. Negative for open lesions or macerations.  Vascular: Palpable pedal pulses bilaterally. No edema or erythema noted. Capillary refill within normal limits.  Neurological: Grossly intact via light touch  Musculoskeletal Exam: Pain on palpation at the keratotic lesion(s) noted. Range of motion within normal limits bilateral. Muscle strength 5/5 in all groups bilateral.  Assessment: 1.  Symptomatic benign skin lesion lateral aspect of the right fifth digit  Plan of Care:  -Patient evaluated -Excisional debridement of keratoic lesion(s) using a chisel blade was performed without incident.  - Triple antibiotic applied with a bandaid -Return to the clinic PRN.   Felecia Shelling, DPM [facility]  Dr. Felecia Shelling, DPM    [address] [place], [place] [address]                Office [phone]  Fax [phone]

## 2023-07-08 DIAGNOSIS — K219 Gastro-esophageal reflux disease without esophagitis: Secondary | ICD-10-CM | POA: Diagnosis not present

## 2023-07-08 DIAGNOSIS — K5909 Other constipation: Secondary | ICD-10-CM | POA: Diagnosis not present

## 2023-07-08 DIAGNOSIS — R194 Change in bowel habit: Secondary | ICD-10-CM | POA: Diagnosis not present

## 2023-07-09 DIAGNOSIS — Z91038 Other insect allergy status: Secondary | ICD-10-CM | POA: Diagnosis not present

## 2023-07-09 DIAGNOSIS — L309 Dermatitis, unspecified: Secondary | ICD-10-CM | POA: Diagnosis not present

## 2023-07-11 ENCOUNTER — Other Ambulatory Visit (HOSPITAL_COMMUNITY): Payer: Self-pay | Admitting: Cardiology

## 2023-07-11 ENCOUNTER — Other Ambulatory Visit (HOSPITAL_COMMUNITY): Payer: Self-pay

## 2023-07-11 DIAGNOSIS — I5032 Chronic diastolic (congestive) heart failure: Secondary | ICD-10-CM

## 2023-07-11 DIAGNOSIS — I2510 Atherosclerotic heart disease of native coronary artery without angina pectoris: Secondary | ICD-10-CM

## 2023-07-11 MED ORDER — APIXABAN 5 MG PO TABS: 5.0000 mg | ORAL_TABLET | Freq: Two times a day (BID) | ORAL | 0 refills | Status: DC

## 2023-07-11 MED ORDER — ATORVASTATIN CALCIUM 80 MG PO TABS: 80.0000 mg | ORAL_TABLET | Freq: Every day | ORAL | 0 refills | Status: DC

## 2023-08-01 DIAGNOSIS — L718 Other rosacea: Secondary | ICD-10-CM | POA: Diagnosis not present

## 2023-08-01 DIAGNOSIS — L821 Other seborrheic keratosis: Secondary | ICD-10-CM | POA: Diagnosis not present

## 2023-08-01 DIAGNOSIS — C44329 Squamous cell carcinoma of skin of other parts of face: Secondary | ICD-10-CM | POA: Diagnosis not present

## 2023-08-01 DIAGNOSIS — D485 Neoplasm of uncertain behavior of skin: Secondary | ICD-10-CM | POA: Diagnosis not present

## 2023-08-01 DIAGNOSIS — D225 Melanocytic nevi of trunk: Secondary | ICD-10-CM | POA: Diagnosis not present

## 2023-08-01 DIAGNOSIS — L570 Actinic keratosis: Secondary | ICD-10-CM | POA: Diagnosis not present

## 2023-08-07 DIAGNOSIS — J189 Pneumonia, unspecified organism: Secondary | ICD-10-CM | POA: Diagnosis not present

## 2023-08-07 DIAGNOSIS — J690 Pneumonitis due to inhalation of food and vomit: Secondary | ICD-10-CM | POA: Diagnosis not present

## 2023-08-07 DIAGNOSIS — K5909 Other constipation: Secondary | ICD-10-CM | POA: Diagnosis not present

## 2023-08-07 DIAGNOSIS — I482 Chronic atrial fibrillation, unspecified: Secondary | ICD-10-CM | POA: Diagnosis not present

## 2023-08-07 DIAGNOSIS — I10 Essential (primary) hypertension: Secondary | ICD-10-CM | POA: Diagnosis not present

## 2023-08-07 DIAGNOSIS — K219 Gastro-esophageal reflux disease without esophagitis: Secondary | ICD-10-CM | POA: Diagnosis not present

## 2023-08-07 DIAGNOSIS — E785 Hyperlipidemia, unspecified: Secondary | ICD-10-CM | POA: Diagnosis not present

## 2023-08-07 DIAGNOSIS — I77810 Thoracic aortic ectasia: Secondary | ICD-10-CM | POA: Diagnosis not present

## 2023-08-21 DIAGNOSIS — D2339 Other benign neoplasm of skin of other parts of face: Secondary | ICD-10-CM | POA: Diagnosis not present

## 2023-08-21 DIAGNOSIS — C44329 Squamous cell carcinoma of skin of other parts of face: Secondary | ICD-10-CM | POA: Diagnosis not present

## 2023-09-02 ENCOUNTER — Ambulatory Visit (HOSPITAL_BASED_OUTPATIENT_CLINIC_OR_DEPARTMENT_OTHER)
Admission: RE | Admit: 2023-09-02 | Discharge: 2023-09-02 | Disposition: A | Discharge status: Home / Self Care | Payer: Medicare Other | Source: Ambulatory Visit | Attending: Internal Medicine | Admitting: Internal Medicine

## 2023-09-02 ENCOUNTER — Encounter (HOSPITAL_COMMUNITY): Payer: Self-pay | Admitting: Internal Medicine

## 2023-09-02 ENCOUNTER — Ambulatory Visit (HOSPITAL_COMMUNITY)
Admission: RE | Admit: 2023-09-02 | Discharge: 2023-09-02 | Disposition: A | Discharge status: Home / Self Care | Payer: Medicare Other | Source: Ambulatory Visit | Attending: Internal Medicine | Admitting: Internal Medicine

## 2023-09-02 VITALS — BP 130/66 | HR 56

## 2023-09-02 DIAGNOSIS — I110 Hypertensive heart disease with heart failure: Secondary | ICD-10-CM | POA: Insufficient documentation

## 2023-09-02 DIAGNOSIS — I495 Sick sinus syndrome: Secondary | ICD-10-CM | POA: Insufficient documentation

## 2023-09-02 DIAGNOSIS — I5032 Chronic diastolic (congestive) heart failure: Secondary | ICD-10-CM

## 2023-09-02 DIAGNOSIS — I6523 Occlusion and stenosis of bilateral carotid arteries: Secondary | ICD-10-CM | POA: Diagnosis not present

## 2023-09-02 DIAGNOSIS — I3481 Nonrheumatic mitral (valve) annulus calcification: Secondary | ICD-10-CM | POA: Insufficient documentation

## 2023-09-02 DIAGNOSIS — I482 Chronic atrial fibrillation, unspecified: Secondary | ICD-10-CM | POA: Insufficient documentation

## 2023-09-02 DIAGNOSIS — I10 Essential (primary) hypertension: Secondary | ICD-10-CM

## 2023-09-02 DIAGNOSIS — Z7901 Long term (current) use of anticoagulants: Secondary | ICD-10-CM | POA: Diagnosis not present

## 2023-09-02 DIAGNOSIS — Z951 Presence of aortocoronary bypass graft: Secondary | ICD-10-CM | POA: Diagnosis not present

## 2023-09-02 DIAGNOSIS — I2510 Atherosclerotic heart disease of native coronary artery without angina pectoris: Secondary | ICD-10-CM | POA: Diagnosis not present

## 2023-09-02 DIAGNOSIS — I77810 Thoracic aortic ectasia: Secondary | ICD-10-CM | POA: Diagnosis not present

## 2023-09-02 DIAGNOSIS — G4733 Obstructive sleep apnea (adult) (pediatric): Secondary | ICD-10-CM | POA: Diagnosis not present

## 2023-09-02 DIAGNOSIS — E785 Hyperlipidemia, unspecified: Secondary | ICD-10-CM | POA: Insufficient documentation

## 2023-09-02 DIAGNOSIS — Z79899 Other long term (current) drug therapy: Secondary | ICD-10-CM | POA: Diagnosis not present

## 2023-09-02 LAB — ECHOCARDIOGRAM COMPLETE: S' Lateral: 3 cm

## 2023-09-02 MED ORDER — ATORVASTATIN CALCIUM 80 MG PO TABS: 80.0000 mg | ORAL_TABLET | Freq: Every day | ORAL | 3 refills | Status: DC

## 2023-09-02 MED ORDER — AMLODIPINE BESYLATE 5 MG PO TABS: 5.0000 mg | ORAL_TABLET | Freq: Every day | ORAL | 3 refills | Status: DC

## 2023-09-02 MED ORDER — APIXABAN 5 MG PO TABS: 5.0000 mg | ORAL_TABLET | Freq: Two times a day (BID) | ORAL | 3 refills | Status: DC

## 2023-09-02 MED ORDER — LOSARTAN POTASSIUM 50 MG PO TABS: 50.0000 mg | ORAL_TABLET | Freq: Every day | ORAL | 3 refills | Status: DC

## 2023-09-02 MED ORDER — HYDROCHLOROTHIAZIDE 12.5 MG PO CAPS: 12.5000 mg | ORAL_CAPSULE | Freq: Every day | ORAL | 3 refills | Status: DC

## 2023-09-02 NOTE — Addendum Note (Signed)
Encounter addended by: Noralee Space, RN on: 09/02/2023 11:58 AM  Actions taken: Visit diagnoses modified, Order list changed, Diagnosis association updated

## 2023-09-02 NOTE — Progress Notes (Signed)
Advanced Heart Failure Clinic Note   PCP: Dr Sullivan Lone HPI:  Micheal Wright is a 82 y.o. male with a history of coronary artery disease status post coronary artery bypass grafting in 1998.  Last catheterization was in January 2002 due to an abnormal stress test with 4 mm of ST elevation during exercise.  Cardiac catheterization showed patent grafts.  He has a history of hypertension, PAF, hyperlipidemia, and sleep apnea. Myoview 2/09 EF 61% No ischemia.   Had episode PAF in 12/16 during colonoscopy. Underwent DC-CV. Started Eliquis.   Echo 8/22: EF 60-65%. RV low normal RVSP ok. Personally reviewed  Today he returns for HF follow up. Doing ok. Still struggling with tinnitus. Remains active. No CP or SOB. HRs running in mid 40s. About 5 weeks was had an episode that made him feel faint   Echo today 09/02/23 EF 60% mild Aov sclerosis   Studies:  Sleep Study  04/03/2018 . CPAP Titration completed 04/14/2018   PYP 03/26/2018   H/CL 1.14 Visual grade 2. Equivocal  cMRI 5/19: EF 52% mid myocardial gadolinium uptake on delayed inversion recovery sequences in the septum, apex and inferior base. Reviewed by Drs. Delton See and Lander and not felt c/w amyloid  Echo 03/02/2018  LVEF 55-60% Mild RV dilation. Mild to mod TR. RVSP not elevated on echo. Personally reviewed  Echo 01/2015 EF 60-65%, no AR, Mild MR/TR, PA peak pressure 33 mmHg, GLPSS -21% LA size 3.8cm Carotid US 01/2015 with minimal plaque bilaterally.  Carotid u/s 11/2012  R ok 40-59% (stable).                       3/16: 1-39% bilaterally ABI 12/2009  R 1.0  L 1.1    Review of systems complete and found to be negative unless listed in HPI.    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    Atrial complex, premature 05/04/2014   CAD (coronary artery disease)    s/p CABG 1998;  Last cath 2002 in setting of marked ST elevation during stress testing. patent grafts; Echo 8/09 EF 55%. No significant valvular disease.   Cardiac murmur 09/03/2011   Overview:  Overview:  Last Assessment & Plan:  Likely mild aortic valve sclerosis. Will check echo.     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 Due for repeat u/s.  Overview:  Overview:  Qualifier: Diagnosis of  By: Gala Romney, MD, Trixie Dredge  Last Assessment & Plan:  Asymptomatic. Due for repeat u/s.     Carotid stenosis 08/22/2009   Qualifier: Diagnosis of  By: Gala Romney, MD, Trixie Dredge    Cerebrovascular disease 11/17/2008   Chest tightness 01/20/2015   CHF (congestive heart failure) (HCC)    Chronic atrial fibrillation (HCC) 03/02/2018   Coronary artery disease 05/11/2007   Qualifier: Diagnosis of  By: Pasty Arch LPN, Rene Kocher     Erectile dysfunction    Essential hypertension 05/11/2007   Overview:  Overview:  Overview:  Qualifier: Diagnosis of  By: Pasty Arch LPN, Regina   Last Assessment & Plan:  BP up slightly here but when he checks at [employer]  De[employer] usually 120/65. Will continue current regimen.  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     amLODipine (NORVASC) 5 MG tablet TAKE 1 TABLET BY MOUTH ONCE A DAY 90 tablet 3   apixaban (ELIQUIS) 5 MG TABS tablet Take 1 tablet (5 mg total) by mouth 2 (two) times daily. 30 tablet 0   atorvastatin (LIPITOR) 80 MG tablet Take 1 tablet (80 mg total) by mouth daily. 30 tablet 0   hydrochlorothiazide (MICROZIDE) 12.5 MG capsule TAKE 1 CAPSULE BY MOUTH ONCE DAILY 90 capsule 3   losartan (COZAAR) 50 MG tablet TAKE 1 TABLET BY MOUTH ONCE A DAY 90 tablet 3   nitroGLYCERIN (NITROSTAT) 0.4 MG SL tablet DISSOLVE 1 TABLET UNDER THE TONGUE FOR CHEST PAIN. MAY REPEAT EVERY UP TO 3 DOSES. IF NO RELIEF, CALL 911** 25 tablet 3   omeprazole (PRILOSEC) 40 MG capsule TAKE 1 CAPSULE BY MOUTH ONCE DAILY 90 capsule 0   No current facility-administered medications for this encounter.   Vitals:   09/02/23 1054  BP: 130/66  Pulse: (!) 56  SpO2: 97%    Wt Readings from Last 3 Encounters:  11/13/22 84.8 kg (187 lb)  08/06/22 85.7 kg (189 lb)  06/25/22 85 kg (187 lb 6.4 oz)    PHYSICAL EXAM: General:  Well appearing. No resp difficulty HEENT: normal Neck: supple. no JVD. Carotids 2+ bilat; no bruits. No lymphadenopathy or thryomegaly appreciated. Cor: PMI nondisplaced. Irregular rate & rhythm.2/6 SEM RSB Lungs: clear Abdomen: soft, nontender, nondistended. No hepatosplenomegaly. No bruits or masses. Good bowel sounds. Extremities: no cyanosis, clubbing, rash, edema Neuro: alert & orientedx3, cranial nerves grossly intact. moves all 4 extremities w/o difficulty. Affect pleasant  Hall walk HR 40 at rest 90 with walk Personally reviewed   ASSESSMENT & PLAN:  1) LE edema - Resolved with addition of HCTZ - likely mixed picture between dependent edema (with LE varicosities) but also has some RV strain on echo and MRI  - unable to use spiro due to hyperkalemia.  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 Quanitive 1.14 - cMRI 5/19: LVEF 52% mid myocardial gadolinium uptake on  delayed inversion recovery sequences in the septum, apex and inferior base. RV 28%  Reviewed by Drs. Delton See and Mclean and not felt c/w amyloid  - Resolved - Echo today EF 60%   2) Tachy brady Chronic a fib.  - Rates frequently in 40s on Fit Bit but when I walked him down the hall HR responded normally and got up to 90 with hall walk - Will likely need PM at some point but not yet  - Continues on Eliquis. No bleeding  - CHADS VASC 4 (age =2. CAD, HTN)  3) CAD, s/p  CABG 1998 - No s/s ischemia  - Myoview 3/16 OK.   4) HTN - Blood pressure well controlled. Continue current regimen.  5) HL - Managed by Dr. Sullivan Lone. Goal LDL < 70  6) Carotid stenosis, mild - US Carotid 05/28/2017 Bilateral 1-39%. No need for f/u testing at this point  7) Sleep Apnea - Completed sleep study 5/32019 Mild OSA AHI 6.1 - Has stopped using CPAP. Snoring improved with 10 pound weight loss. 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

## 2023-09-02 NOTE — Patient Instructions (Signed)
Great to see you today!!!  Refills have been sent to your pharmacy  Your physician recommends that you schedule a follow-up appointment in: 1 year (Oct 2025), **PLEASE CALL OUR OFFICE IN AUGUST TO SCHEDULE THIS APPOINTMENT  If you have any questions or concerns before your next appointment please send [place] a message through [place] or call our office at [phone].    TO LEAVE A MESSAGE FOR THE NURSE SELECT OPTION 2, PLEASE LEAVE A MESSAGE INCLUDING: YOUR NAME DATE OF BIRTH CALL BACK NUMBER REASON FOR CALL**this is important as we prioritize the call backs  YOU WILL RECEIVE A CALL BACK THE SAME DAY AS LONG AS YOU CALL BEFORE 4:00 PM

## 2023-09-02 NOTE — Addendum Note (Signed)
Encounter addended by: Noralee Space, RN on: 09/02/2023 11:56 AM  Actions taken: Order Reconciliation Section accessed, Clinical Note Signed

## 2023-10-17 DIAGNOSIS — M25562 Pain in left knee: Secondary | ICD-10-CM | POA: Diagnosis not present

## 2023-10-17 DIAGNOSIS — M25561 Pain in right knee: Secondary | ICD-10-CM | POA: Diagnosis not present

## 2023-11-05 DIAGNOSIS — R200 Anesthesia of skin: Secondary | ICD-10-CM | POA: Diagnosis not present

## 2023-11-05 DIAGNOSIS — R202 Paresthesia of skin: Secondary | ICD-10-CM | POA: Diagnosis not present

## 2023-11-06 DIAGNOSIS — I10 Essential (primary) hypertension: Secondary | ICD-10-CM | POA: Diagnosis not present

## 2023-11-06 DIAGNOSIS — Z0000 Encounter for general adult medical examination without abnormal findings: Secondary | ICD-10-CM | POA: Diagnosis not present

## 2023-11-06 DIAGNOSIS — I482 Chronic atrial fibrillation, unspecified: Secondary | ICD-10-CM | POA: Diagnosis not present

## 2023-11-06 DIAGNOSIS — G4733 Obstructive sleep apnea (adult) (pediatric): Secondary | ICD-10-CM | POA: Diagnosis not present

## 2023-11-06 DIAGNOSIS — E785 Hyperlipidemia, unspecified: Secondary | ICD-10-CM | POA: Diagnosis not present

## 2023-11-07 DIAGNOSIS — E785 Hyperlipidemia, unspecified: Secondary | ICD-10-CM | POA: Diagnosis not present

## 2023-11-07 DIAGNOSIS — G4733 Obstructive sleep apnea (adult) (pediatric): Secondary | ICD-10-CM | POA: Diagnosis not present

## 2023-11-07 DIAGNOSIS — I10 Essential (primary) hypertension: Secondary | ICD-10-CM | POA: Diagnosis not present

## 2023-11-10 DIAGNOSIS — G6189 Other inflammatory polyneuropathies: Secondary | ICD-10-CM | POA: Diagnosis not present

## 2023-11-10 DIAGNOSIS — R296 Repeated falls: Secondary | ICD-10-CM | POA: Diagnosis not present

## 2023-11-10 DIAGNOSIS — R2689 Other abnormalities of gait and mobility: Secondary | ICD-10-CM | POA: Diagnosis not present

## 2023-11-10 DIAGNOSIS — I4891 Unspecified atrial fibrillation: Secondary | ICD-10-CM | POA: Diagnosis not present

## 2023-11-20 DIAGNOSIS — H6123 Impacted cerumen, bilateral: Secondary | ICD-10-CM | POA: Diagnosis not present

## 2023-11-20 DIAGNOSIS — K219 Gastro-esophageal reflux disease without esophagitis: Secondary | ICD-10-CM | POA: Diagnosis not present

## 2023-11-20 DIAGNOSIS — H6063 Unspecified chronic otitis externa, bilateral: Secondary | ICD-10-CM | POA: Diagnosis not present

## 2023-11-20 DIAGNOSIS — H903 Sensorineural hearing loss, bilateral: Secondary | ICD-10-CM | POA: Diagnosis not present

## 2024-02-12 DIAGNOSIS — D2272 Melanocytic nevi of left lower limb, including hip: Secondary | ICD-10-CM | POA: Diagnosis not present

## 2024-02-12 DIAGNOSIS — D2262 Melanocytic nevi of left upper limb, including shoulder: Secondary | ICD-10-CM | POA: Diagnosis not present

## 2024-02-12 DIAGNOSIS — B353 Tinea pedis: Secondary | ICD-10-CM | POA: Diagnosis not present

## 2024-02-12 DIAGNOSIS — Z08 Encounter for follow-up examination after completed treatment for malignant neoplasm: Secondary | ICD-10-CM | POA: Diagnosis not present

## 2024-02-12 DIAGNOSIS — D2261 Melanocytic nevi of right upper limb, including shoulder: Secondary | ICD-10-CM | POA: Diagnosis not present

## 2024-02-12 DIAGNOSIS — D225 Melanocytic nevi of trunk: Secondary | ICD-10-CM | POA: Diagnosis not present

## 2024-02-12 DIAGNOSIS — Z85828 Personal history of other malignant neoplasm of skin: Secondary | ICD-10-CM | POA: Diagnosis not present

## 2024-02-12 DIAGNOSIS — D2271 Melanocytic nevi of right lower limb, including hip: Secondary | ICD-10-CM | POA: Diagnosis not present

## 2024-02-12 DIAGNOSIS — L718 Other rosacea: Secondary | ICD-10-CM | POA: Diagnosis not present

## 2024-02-12 DIAGNOSIS — L821 Other seborrheic keratosis: Secondary | ICD-10-CM | POA: Diagnosis not present

## 2024-05-03 DIAGNOSIS — H43813 Vitreous degeneration, bilateral: Secondary | ICD-10-CM | POA: Diagnosis not present

## 2024-05-03 DIAGNOSIS — M3501 Sicca syndrome with keratoconjunctivitis: Secondary | ICD-10-CM | POA: Diagnosis not present

## 2024-05-03 DIAGNOSIS — H02889 Meibomian gland dysfunction of unspecified eye, unspecified eyelid: Secondary | ICD-10-CM | POA: Diagnosis not present

## 2024-05-06 ENCOUNTER — Encounter: Payer: Self-pay | Admitting: Internal Medicine

## 2024-05-06 ENCOUNTER — Ambulatory Visit: Attending: Internal Medicine | Admitting: Internal Medicine

## 2024-05-06 VITALS — BP 119/71 | HR 48 | Wt 187.0 lb

## 2024-05-06 DIAGNOSIS — G4733 Obstructive sleep apnea (adult) (pediatric): Secondary | ICD-10-CM | POA: Diagnosis not present

## 2024-05-06 DIAGNOSIS — I480 Paroxysmal atrial fibrillation: Secondary | ICD-10-CM | POA: Insufficient documentation

## 2024-05-06 DIAGNOSIS — M25562 Pain in left knee: Secondary | ICD-10-CM | POA: Insufficient documentation

## 2024-05-06 DIAGNOSIS — I495 Sick sinus syndrome: Secondary | ICD-10-CM | POA: Insufficient documentation

## 2024-05-06 DIAGNOSIS — E875 Hyperkalemia: Secondary | ICD-10-CM | POA: Insufficient documentation

## 2024-05-06 DIAGNOSIS — Z79899 Other long term (current) drug therapy: Secondary | ICD-10-CM | POA: Insufficient documentation

## 2024-05-06 DIAGNOSIS — I10 Essential (primary) hypertension: Secondary | ICD-10-CM

## 2024-05-06 DIAGNOSIS — E7800 Pure hypercholesterolemia, unspecified: Secondary | ICD-10-CM | POA: Diagnosis not present

## 2024-05-06 DIAGNOSIS — Z7901 Long term (current) use of anticoagulants: Secondary | ICD-10-CM | POA: Diagnosis not present

## 2024-05-06 DIAGNOSIS — Z951 Presence of aortocoronary bypass graft: Secondary | ICD-10-CM | POA: Insufficient documentation

## 2024-05-06 DIAGNOSIS — I482 Chronic atrial fibrillation, unspecified: Secondary | ICD-10-CM | POA: Diagnosis not present

## 2024-05-06 DIAGNOSIS — E785 Hyperlipidemia, unspecified: Secondary | ICD-10-CM | POA: Diagnosis not present

## 2024-05-06 DIAGNOSIS — I5032 Chronic diastolic (congestive) heart failure: Secondary | ICD-10-CM | POA: Insufficient documentation

## 2024-05-06 DIAGNOSIS — I714 Abdominal aortic aneurysm, without rupture, unspecified: Secondary | ICD-10-CM | POA: Diagnosis not present

## 2024-05-06 DIAGNOSIS — I6523 Occlusion and stenosis of bilateral carotid arteries: Secondary | ICD-10-CM | POA: Insufficient documentation

## 2024-05-06 DIAGNOSIS — I110 Hypertensive heart disease with heart failure: Secondary | ICD-10-CM | POA: Insufficient documentation

## 2024-05-06 DIAGNOSIS — I77810 Thoracic aortic ectasia: Secondary | ICD-10-CM | POA: Diagnosis not present

## 2024-05-06 DIAGNOSIS — I2510 Atherosclerotic heart disease of native coronary artery without angina pectoris: Secondary | ICD-10-CM | POA: Insufficient documentation

## 2024-05-06 NOTE — Progress Notes (Signed)
 [facility] Note   PCP: Dr Oletta Berry HPI:  Micheal Wright is a 83 y.o. male with a history of coronary artery disease status post coronary artery bypass grafting in 1998.  Last catheterization was in January 2002 due to an abnormal stress test with 4 mm of ST elevation during exercise.  Cardiac catheterization showed patent grafts.  He has a history of hypertension, PAF, hyperlipidemia, and sleep apnea. Myoview 2/09 EF 61% No ischemia.   Had episode PAF in 12/16 during colonoscopy. Underwent DC-CV. Started Eliquis .   Echo 8/22: EF 60-65%. RV low normal RVSP ok. Personally reviewed  Echo 09/02/23 EF 60% mild Aov sclerosis   Here for routine f/u. Says he is slowing down a bit as he ages but doing ok. No CP or SOB. Struggling with left knee pain. Pending replacement with Dr. Leighton Punches  No edema, orthopnea or PND   Studies:  Sleep Study  04/03/2018 . CPAP Titration completed 04/14/2018   PYP 03/26/2018   H/CL 1.14 Visual grade 2. Equivocal  cMRI 5/19: EF 52% mid myocardial gadolinium uptake on delayed inversion recovery sequences in the septum, apex and inferior base. Reviewed by Drs. Nicholette Barley and [place] and not felt c/w amyloid  Echo 03/02/2018  LVEF 55-60% Mild RV dilation. Mild to mod TR. RVSP not elevated on echo. Personally reviewed  Echo 01/2015 EF 60-65%, no AR, Mild MR/TR, PA peak pressure 33 mmHg, GLPSS -21% LA size 3.8cm Carotid US  01/2015 with minimal plaque bilaterally.  Carotid u/s 11/2012  R ok 40-59% (stable).                       3/16: 1-39% bilaterally ABI 12/2009  R 1.0  L 1.1    Review of systems complete and found to be negative unless listed in HPI.    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    Atrial complex, premature 05/04/2014   CAD (coronary artery disease)    s/p CABG 1998; Last  cath 2002 in setting of marked ST elevation during stress testing. patent grafts; Echo 8/09 EF 55%. No significant valvular disease.   Cardiac murmur 09/03/2011   Overview:  Overview:  Last Assessment & Plan:  Likely mild aortic valve sclerosis. Will check echo.     Carotid artery disease (HCC)    u/s 9/10: R 40-59% L 0-39%   Carotid artery obstruction 08/22/2009   Overview:  Overview:  Qualifier: Diagnosis of  By: Julane Ny, MD, Dois Freeze  Last Assessment & Plan:  Asymptomatic. Due for repeat u/s.  Overview:  Overview:  Qualifier: Diagnosis of  By: Julane Ny, MD, Dois Freeze  Last Assessment & Plan:  Asymptomatic. Due for repeat u/s.     Carotid stenosis 08/22/2009   Qualifier: Diagnosis of  By: Julane Ny, MD, Dois Freeze    Cerebrovascular disease 11/17/2008   Chest tightness 01/20/2015   CHF (congestive heart failure) (HCC)    Chronic atrial fibrillation (HCC) 03/02/2018   Coronary artery disease 05/11/2007   Qualifier: Diagnosis of  By: Taunya Fass LPN, [place] Record     Erectile dysfunction    Essential hypertension 05/11/2007   Overview:  Overview:  Overview:  Qualifier: Diagnosis of  By: Taunya Fass LPN, [place]   Last Assessment & Plan:  BP up slightly here but when he checks at  [employer] usually 120/65. Will continue current regimen.  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     amLODipine  (NORVASC ) 5 MG tablet Take 1 tablet (5 mg total) by mouth daily. 90 tablet 3   apixaban  (ELIQUIS ) 5 MG TABS tablet Take 1 tablet (5 mg total) by mouth 2 (two) times daily. 180 tablet 3   atorvastatin  (LIPITOR) 80 MG tablet Take 1 tablet (80 mg total) by mouth daily. 90 tablet 3   hydrochlorothiazide  (MICROZIDE ) 12.5 MG capsule Take 1 capsule (12.5 mg total) by mouth daily. 90 capsule 3   losartan  (COZAAR ) 50 MG tablet Take 1 tablet (50 mg total) by mouth daily. 90 tablet 3   nitroGLYCERIN  (NITROSTAT ) 0.4 MG SL tablet DISSOLVE 1 TABLET UNDER THE TONGUE FOR CHEST PAIN. MAY REPEAT EVERY UP TO 3 DOSES. IF NO RELIEF, CALL 911** 25 tablet 3   omeprazole (PRILOSEC) 40 MG capsule TAKE 1 CAPSULE BY MOUTH ONCE DAILY (Patient taking differently: Take 20 mg by mouth daily.) 90 capsule 0   No current facility-administered medications for this visit.   Vitals:   05/06/24 1549  BP: 119/71  Pulse: (!) 48  SpO2: 100%  Weight: 187 lb (84.8 kg)     Wt Readings from Last 3 Encounters:  05/06/24 187 lb (84.8 kg)  11/13/22 187 lb (84.8 kg)  08/06/22 189 lb (85.7 kg)    PHYSICAL EXAM: General:  Well appearing. No resp difficulty HEENT: normal Neck: supple. no JVD. Carotids 2+ bilat; no bruits. No lymphadenopathy or thryomegaly appreciated. Cor: PMI nondisplaced. Irreg brady Lungs: clear Abdomen: soft, nontender, nondistended. No hepatosplenomegaly. No bruits or masses. Good bowel sounds. Extremities: no cyanosis, clubbing, rash, edema Neuro: alert & orientedx3, cranial nerves grossly intact. moves all 4 extremities w/o difficulty. Affect pleasant  ECG AF 46 No ST-T wave abnormalities.    Recent hall walk  HR 40 at rest 90 with walk    ASSESSMENT & PLAN:  1) LE edema - Resolved with addition of HCTZ - likely mixed picture between dependent edema (with LE varicosities) but also has some RV strain on echo and MRI  - unable to use spiro due to hyperkalemia.  - PYP  equivocal for TTR amyloidosis Grade 2. Quanitive 1.14 - cMRI 5/19: LVEF 52% mid myocardial gadolinium uptake on delayed inversion recovery sequences in the septum, apex and inferior base. RV 28%  Reviewed by Drs. Nicholette Barley and [place] and not felt c/w amyloid  - Resolved - Echo 09/02/23 EF 60% mild Aov sclerosis  - Remains concerned over possibility of TTR cardiac amyloid. Will repeat cMRI to reassess previously abnormal study  2) Tachy brady Chronic a fib.  - Rates frequently in 40s on Fit Bit but when I walked  him down the hall HR responded normally and got up to 90 with hall walk - Continues on Eliquis . No bleeding  - CHADS VASC 4 (age =2. CAD, HTN) - Stable. No indication for PM currently  3) CAD, s/p CABG 1998 - Myoview 3/16 OK - no s/s ischemia.  - Continue Eliquis /statin - LDL 39 in 12/24 (Followed by Dr. Oletta Berry)  4) HTN - Blood pressure well controlled. Continue current regimen.  5) HL - Managed by Dr. Oletta Berry. Goal LDL < 70 - LDL 39 in 12/24  6) Carotid stenosis, mild - US  Carotid 05/28/2017 Bilateral 1-39%. - u/s carotid 11/21 1-39%   7) Sleep Apnea - Completed sleep study 5/32019 Mild OSA AHI 6.1 - Has stopped using CPAP. Snoring improved with 10 pound weight loss. 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

## 2024-05-07 ENCOUNTER — Encounter: Admitting: Internal Medicine

## 2024-05-10 DIAGNOSIS — G608 Other hereditary and idiopathic neuropathies: Secondary | ICD-10-CM | POA: Diagnosis not present

## 2024-05-10 DIAGNOSIS — Z1331 Encounter for screening for depression: Secondary | ICD-10-CM | POA: Diagnosis not present

## 2024-05-10 DIAGNOSIS — R2689 Other abnormalities of gait and mobility: Secondary | ICD-10-CM | POA: Diagnosis not present

## 2024-05-10 NOTE — Addendum Note (Signed)
 Addended by: Glorietta Lark on: 05/10/2024 02:34 PM   Modules accepted: Orders

## 2024-05-13 DIAGNOSIS — R1314 Dysphagia, pharyngoesophageal phase: Secondary | ICD-10-CM | POA: Diagnosis not present

## 2024-05-13 DIAGNOSIS — D1030 Benign neoplasm of unspecified part of mouth: Secondary | ICD-10-CM | POA: Diagnosis not present

## 2024-05-13 DIAGNOSIS — H903 Sensorineural hearing loss, bilateral: Secondary | ICD-10-CM | POA: Diagnosis not present

## 2024-05-13 DIAGNOSIS — H6123 Impacted cerumen, bilateral: Secondary | ICD-10-CM | POA: Diagnosis not present

## 2024-06-26 ENCOUNTER — Other Ambulatory Visit: Payer: Self-pay | Admitting: Internal Medicine

## 2024-07-05 ENCOUNTER — Telehealth: Payer: Self-pay

## 2024-07-05 ENCOUNTER — Other Ambulatory Visit
Admission: RE | Admit: 2024-07-05 | Discharge: 2024-07-05 | Disposition: A | Discharge status: Home / Self Care | Source: Ambulatory Visit | Attending: Internal Medicine | Admitting: Internal Medicine

## 2024-07-05 DIAGNOSIS — I10 Essential (primary) hypertension: Secondary | ICD-10-CM | POA: Diagnosis not present

## 2024-07-05 DIAGNOSIS — I5032 Chronic diastolic (congestive) heart failure: Secondary | ICD-10-CM

## 2024-07-05 DIAGNOSIS — I77810 Thoracic aortic ectasia: Secondary | ICD-10-CM | POA: Diagnosis not present

## 2024-07-05 DIAGNOSIS — I482 Chronic atrial fibrillation, unspecified: Secondary | ICD-10-CM | POA: Diagnosis not present

## 2024-07-05 LAB — CBC
HCT: 34.4 % — ABNORMAL LOW (ref 39.0–52.0)
Hemoglobin: 11.8 g/dL — ABNORMAL LOW (ref 13.0–17.0)
MCH: 32.1 pg (ref 26.0–34.0)
MCHC: 34.3 g/dL (ref 30.0–36.0)
MCV: 93.5 fL (ref 80.0–100.0)
Platelets: 240 K/uL (ref 150–400)
RBC: 3.68 MIL/uL — ABNORMAL LOW (ref 4.22–5.81)
RDW: 13 % (ref 11.5–15.5)
WBC: 7.2 K/uL (ref 4.0–10.5)
nRBC: 0 % (ref 0.0–0.2)

## 2024-07-05 NOTE — Telephone Encounter (Signed)
 Pt requires updated CBC prior to CMRI on 8/6.  Pt notified and states he can come to [facility] for CBC today.  CBC order placed and MRI staff notified.

## 2024-07-07 ENCOUNTER — Ambulatory Visit (HOSPITAL_COMMUNITY)
Admission: RE | Admit: 2024-07-07 | Discharge: 2024-07-07 | Disposition: A | Discharge status: Home / Self Care | Source: Ambulatory Visit | Attending: Internal Medicine | Admitting: Internal Medicine

## 2024-07-07 ENCOUNTER — Other Ambulatory Visit: Payer: Self-pay | Admitting: Internal Medicine

## 2024-07-07 DIAGNOSIS — I5032 Chronic diastolic (congestive) heart failure: Secondary | ICD-10-CM

## 2024-07-07 MED ORDER — GADOBUTROL 1 MMOL/ML IV SOLN
10.0000 mL | Freq: Once | INTRAVENOUS | Status: AC | PRN
  Administered 2024-07-07: 10 mL via INTRAVENOUS

## 2024-07-22 ENCOUNTER — Encounter: Payer: Self-pay | Admitting: Internal Medicine

## 2024-07-23 ENCOUNTER — Encounter: Payer: Self-pay | Admitting: Internal Medicine

## 2024-07-29 ENCOUNTER — Telehealth: Payer: Self-pay

## 2024-07-29 NOTE — Telephone Encounter (Signed)
 Spoke to pt and wife about normal mri per Dr. Bensimhon. Pt and wife verbalized understanding. All questions answered.

## 2024-08-11 DIAGNOSIS — I4891 Unspecified atrial fibrillation: Secondary | ICD-10-CM | POA: Diagnosis not present

## 2024-08-11 DIAGNOSIS — R5383 Other fatigue: Secondary | ICD-10-CM | POA: Diagnosis not present

## 2024-08-11 DIAGNOSIS — G608 Other hereditary and idiopathic neuropathies: Secondary | ICD-10-CM | POA: Diagnosis not present

## 2024-08-19 ENCOUNTER — Other Ambulatory Visit: Payer: Self-pay | Admitting: Internal Medicine

## 2024-08-21 ENCOUNTER — Other Ambulatory Visit: Payer: Self-pay | Admitting: Internal Medicine

## 2024-08-22 ENCOUNTER — Telehealth: Payer: Self-pay

## 2024-08-22 NOTE — Telephone Encounter (Signed)
   Received a page for low HR while asleep [HR 31 and 35 ]. Would like to know what to do.  Patient of Dr. Cherrie last seen 05/06/24. Has a history of tachy-brady chronic AF.On eliquis .   Apple watch noted low HR in the low 30s while asleep or when sitting motionless. Only knows HR is low from apple watch alert. Denied lightheadedness/dizziness, syncope, chest pain, and shortness of breath.  I advised patient as long as he is asymptomatic and the HR is not remaining in the low 30s while he is awake, then he does not need to be seen urgently in the ED. I will route message to Dr. Delle for FYI.  Caller verbalized understanding and was grateful for the call back.  Leontine LOISE Salen, PA-C 08/22/2024, 3:20 PM

## 2024-08-23 ENCOUNTER — Telehealth: Payer: Self-pay | Admitting: Family

## 2024-08-23 NOTE — Progress Notes (Unsigned)
 Advanced [facility] Failure Clinic Note   PCP: Dr Bertrum HPI:  Micheal Wright is a 83 y.o. male with a history of coronary artery disease status post coronary artery bypass grafting in 1998.  Last catheterization was in January 2002 due to an abnormal stress test with 4 mm of ST elevation during exercise.  Cardiac catheterization showed patent grafts.  He has a history of hypertension, PAF, hyperlipidemia, and sleep apnea. Myoview 2/09 EF 61% No ischemia.   Had episode PAF in 12/16 during colonoscopy. Underwent DC-CV. Started Eliquis .   Echo 8/22: EF 60-65%. RV low normal RVSP ok. Personally reviewed  Echo 09/02/23 EF 60% mild Aov sclerosis   Here for routine f/u. Says he is slowing down a bit as he ages but doing ok. No CP or SOB. Struggling with left knee pain. Pending replacement with Dr. Duwayne  No edema, orthopnea or PND   Studies:  Sleep Study  04/03/2018 . CPAP Titration completed 04/14/2018   PYP 03/26/2018   H/CL 1.14 Visual grade 2. Equivocal  cMRI 5/19: EF 52% mid myocardial gadolinium uptake on delayed inversion recovery sequences in the septum, apex and inferior base. Reviewed by Drs. Maranda and [place] and not felt c/w amyloid  Echo 03/02/2018  LVEF 55-60% Mild RV dilation. Mild to mod TR. RVSP not elevated on echo. Personally reviewed  Echo 01/2015 EF 60-65%, no AR, Mild MR/TR, PA peak pressure 33 mmHg, GLPSS -21% LA size 3.8cm Carotid US  01/2015 with minimal plaque bilaterally.  Carotid u/s 11/2012  R ok 40-59% (stable).                       3/16: 1-39% bilaterally ABI 12/2009  R 1.0  L 1.1    Review of systems complete and found to be negative unless listed in HPI.    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    Atrial complex, premature 05/04/2014   CAD (coronary artery disease)    s/p CABG 1998; Last cath 2002  in setting of marked ST elevation during stress testing. patent grafts; Echo 8/09 EF 55%. No significant valvular disease.   Cardiac murmur 09/03/2011   Overview:  Overview:  Last Assessment & Plan:  Likely mild aortic valve sclerosis. Will check echo.     Carotid artery disease    u/s 9/10: R 40-59% L 0-39%   Carotid artery obstruction 08/22/2009   Overview:  Overview:  Qualifier: Diagnosis of  By: Cherrie, MD, CODY Toribio SAUNDERS  Last Assessment & Plan:  Asymptomatic. Due for repeat u/s.  Overview:  Overview:  Qualifier: Diagnosis of  By: Cherrie, MD, CODY Toribio SAUNDERS  Last Assessment & Plan:  Asymptomatic. Due for repeat u/s.     Carotid stenosis 08/22/2009   Qualifier: Diagnosis of  By: Cherrie, MD, CODY Toribio SAUNDERS    Cerebrovascular disease 11/17/2008   Chest tightness 01/20/2015   CHF (congestive heart failure) (HCC)    Chronic atrial fibrillation (HCC) 03/02/2018   Coronary artery disease 05/11/2007   Qualifier: Diagnosis of  By: Henrietta LPN, Angeline     Erectile dysfunction    Essential hypertension 05/11/2007   Overview:  Overview:  Overview:  Qualifier: Diagnosis of  By: Henrietta LPN, [place]   Last Assessment & Plan:  BP up slightly here but when he checks at [employer]  usually 120/65. Will continue current regimen.  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      amLODipine  (NORVASC ) 5 MG tablet TAKE 1 TABLET BY MOUTH EVERY DAY 90 tablet 2   atorvastatin  (LIPITOR) 80 MG tablet Take 1 tablet (80 mg total) by mouth daily. 90 tablet 3   ELIQUIS  5 MG TABS tablet TAKE 1 TABLET BY MOUTH TWICE A DAY 180 tablet 2   hydrochlorothiazide  (MICROZIDE ) 12.5 MG capsule TAKE 1 CAPSULE BY MOUTH EVERY DAY 90 capsule 2   losartan  (COZAAR ) 50 MG tablet TAKE 1 TABLET BY MOUTH EVERY DAY 90 tablet 2   nitroGLYCERIN  (NITROSTAT ) 0.4 MG SL tablet DISSOLVE 1 TABLET UNDER THE TONGUE FOR CHEST PAIN. MAY REPEAT EVERY UP TO 3 DOSES. IF NO RELIEF, CALL 911** 25 tablet 3   omeprazole (PRILOSEC) 40 MG capsule TAKE 1 CAPSULE BY MOUTH ONCE DAILY (Patient taking differently: Take 20 mg by mouth daily.) 90 capsule 0   No current facility-administered medications for this visit.   There were no vitals filed for this visit.    Wt Readings from Last 3 Encounters:  05/06/24 187 lb (84.8 kg)  11/13/22 187 lb (84.8 kg)  08/06/22 189 lb (85.7 kg)    PHYSICAL EXAM: General:  Well appearing. No resp difficulty HEENT: normal Neck: supple. no JVD. Carotids 2+ bilat; no bruits. No lymphadenopathy or thryomegaly appreciated. Cor: PMI nondisplaced. Irreg brady Lungs: clear Abdomen: soft, nontender, nondistended. No hepatosplenomegaly. No bruits or masses. Good bowel sounds. Extremities: no cyanosis, clubbing, rash, edema Neuro: alert & orientedx3, cranial nerves grossly intact. moves all 4 extremities w/o difficulty. Affect pleasant  ECG AF 46 No ST-T wave abnormalities.    Recent hall walk  HR 40 at rest 90 with walk    ASSESSMENT & PLAN:  1) LE edema - Resolved with addition of HCTZ - likely mixed picture between dependent edema (with LE varicosities) but also has some RV strain on echo and MRI  - unable to use spiro due to hyperkalemia.  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 Quanitive 1.14 - cMRI 5/19: LVEF 52% mid myocardial gadolinium uptake on delayed  inversion recovery sequences in the septum, apex and inferior base. RV 28%  Reviewed by Drs. Maranda and [place] and not felt c/w amyloid  - Resolved - Echo 09/02/23 EF 60% mild Aov sclerosis  - Remains concerned over possibility of TTR cardiac amyloid. Will repeat cMRI to reassess previously abnormal study  2) Tachy brady Chronic a fib.  - Rates frequently in 40s on Fit Bit but when I walked him down the hall HR responded normally and got up to 90 with hall walk - Continues on Eliquis . No bleeding  - CHADS VASC 4 (age =  2. CAD, HTN) - Stable. No indication for PM currently  3) CAD, s/p CABG 1998 - Myoview 3/16 OK - no s/s ischemia.  - Continue Eliquis /statin - LDL 39 in 12/24 (Followed by Dr. Bertrum)  4) HTN - Blood pressure well controlled. Continue current regimen.  5) HL - Managed by Dr. Bertrum. Goal LDL < 70 - LDL 39 in 12/24  6) Carotid stenosis, mild - US  Carotid 05/28/2017 Bilateral 1-39%. - u/s carotid 11/21 1-39%   7) Sleep Apnea - Completed sleep study 5/32019 Mild OSA AHI 6.1 - Has stopped using CPAP. Snoring improved with 10 pound weight loss. 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

## 2024-08-23 NOTE — Telephone Encounter (Signed)
 Pt's wife calling on behalf of pt. Pt is still experiencing low HR is pt's wife would like to know if there is anything they can do before appt tomorrow. Pt is feeling anxious due to low HR

## 2024-08-23 NOTE — Telephone Encounter (Signed)
 Called to confirm/remind patient of their appointment at the [facility] on 08/24/24.   Appointment:   [] Confirmed  [] Left mess   [] No answer/No voice mail  [x] VM Full/unable to leave message  [] Phone not in service  Patient reminded to bring all medications and/or complete list.  Confirmed patient has transportation. Gave directions, instructed to utilize valet parking.

## 2024-08-24 ENCOUNTER — Ambulatory Visit: Attending: Family | Admitting: Family

## 2024-08-24 ENCOUNTER — Encounter: Payer: Self-pay | Admitting: Family

## 2024-08-24 VITALS — BP 131/69 | HR 53 | Wt 185.2 lb

## 2024-08-24 DIAGNOSIS — I2510 Atherosclerotic heart disease of native coronary artery without angina pectoris: Secondary | ICD-10-CM | POA: Diagnosis not present

## 2024-08-24 DIAGNOSIS — I480 Paroxysmal atrial fibrillation: Secondary | ICD-10-CM | POA: Diagnosis present

## 2024-08-24 DIAGNOSIS — I509 Heart failure, unspecified: Secondary | ICD-10-CM | POA: Insufficient documentation

## 2024-08-24 DIAGNOSIS — I482 Chronic atrial fibrillation, unspecified: Secondary | ICD-10-CM

## 2024-08-24 DIAGNOSIS — E782 Mixed hyperlipidemia: Secondary | ICD-10-CM

## 2024-08-24 DIAGNOSIS — I77810 Thoracic aortic ectasia: Secondary | ICD-10-CM

## 2024-08-24 DIAGNOSIS — G4730 Sleep apnea, unspecified: Secondary | ICD-10-CM | POA: Insufficient documentation

## 2024-08-24 DIAGNOSIS — R600 Localized edema: Secondary | ICD-10-CM

## 2024-08-24 DIAGNOSIS — Z7901 Long term (current) use of anticoagulants: Secondary | ICD-10-CM | POA: Insufficient documentation

## 2024-08-24 DIAGNOSIS — I714 Abdominal aortic aneurysm, without rupture, unspecified: Secondary | ICD-10-CM | POA: Insufficient documentation

## 2024-08-24 DIAGNOSIS — I10 Essential (primary) hypertension: Secondary | ICD-10-CM | POA: Diagnosis not present

## 2024-08-24 DIAGNOSIS — G4733 Obstructive sleep apnea (adult) (pediatric): Secondary | ICD-10-CM

## 2024-08-24 DIAGNOSIS — I110 Hypertensive heart disease with heart failure: Secondary | ICD-10-CM | POA: Insufficient documentation

## 2024-08-24 DIAGNOSIS — Z951 Presence of aortocoronary bypass graft: Secondary | ICD-10-CM | POA: Diagnosis not present

## 2024-08-24 DIAGNOSIS — E785 Hyperlipidemia, unspecified: Secondary | ICD-10-CM | POA: Diagnosis not present

## 2024-08-24 DIAGNOSIS — R5383 Other fatigue: Secondary | ICD-10-CM | POA: Diagnosis present

## 2024-08-24 DIAGNOSIS — I6523 Occlusion and stenosis of bilateral carotid arteries: Secondary | ICD-10-CM | POA: Diagnosis not present

## 2024-08-24 DIAGNOSIS — I6529 Occlusion and stenosis of unspecified carotid artery: Secondary | ICD-10-CM

## 2024-08-24 NOTE — Addendum Note (Signed)
 Addended by: CHERRIE TORIBIO SAUNDERS on: 08/24/2024 10:02 PM   Modules accepted: Level of Service

## 2024-08-24 NOTE — Progress Notes (Signed)
 Patient Name:         DOB:       Height:     Weight:  Office Name:         Referring Provider:  Today's Date:  Date:   STOP BANG RISK ASSESSMENT S (snore) Have you been told that you snore?     NO   T (tired) Are you often tired, fatigued, or sleepy during the day?   YES  O (obstruction) Do you stop breathing, choke, or gasp during sleep? YES   P (pressure) Do you have or are you being treated for high blood pressure? NO   B (BMI) Is your body index greater than 35 kg/m? NO   A (age) Are you 18 years old or older? YES   N (neck) Do you have a neck circumference greater than 16 inches?   NO   G (gender) Are you a male? YES   TOTAL STOP/BANG "YES" ANSWERS 4                                                                       For Office Use Only              Procedure Order Form    YES to 3+ Stop Bang questions OR two clinical symptoms - patient qualifies for WatchPAT (CPT 95800)             Clinical Notes: Will consult Sleep Specialist and refer for management of therapy due to patient increased risk of Sleep Apnea. Ordering a sleep study due to the following two clinical symptoms: Excessive daytime sleepiness G47.10  / History of high blood pressure R03.0    I understand that I am proceeding with a home sleep apnea test as ordered by my treating physician. I understand that untreated sleep apnea is a serious cardiovascular risk factor and it is my responsibility to perform the test and seek management for sleep apnea. I will be contacted with the results and be managed for sleep apnea by a local sleep physician. I will be receiving equipment and further instructions from [facility]. I shall promptly ship back the equipment via the included mailing label. I understand my insurance will be billed for the test and as the patient I am responsible for any insurance related out-of-pocket costs incurred. I have been provided with written instructions and can call for additional  video or telephonic instruction, with 24-hour availability of qualified personnel to answer any questions: Patient Help Desk [phone].  Patient Signature ______________________________________________________   Date______________________ Patient Telemedicine Verbal Consent

## 2024-08-24 NOTE — Patient Instructions (Signed)
 Medication Changes:  STOP Amlodipine   Lab Work:   Go downstairs to [place] on LOWER LEVEL to have your blood work completed.  We will only call you if the results are abnormal or if the provider would like to make medication changes.  No news is good news.     Testing/Procedures:  Your provider has recommended that you have a home sleep study (Itamar Test).  We have provided you with the equipment in our office today. Please go ahead and download the app. DO NOT OPEN OR TAMPER WITH THE BOX UNTIL WE ADVISE YOU TO DO SO. Once insurance has approved the test our office will call you with PIN number and approval to proceed with testing. Once you have completed the test you just dispose of the equipment, the information is automatically uploaded to us  via blue-tooth technology. If your test is positive for sleep apnea and you need a home CPAP machine you will be contacted by Dr Dorine office [facility]) to set this up.   Your provider has recommended that  you wear a Zio Patch for 7 days.  This monitor will record your heart rhythm for our review.  IF you have any symptoms while wearing the monitor please press the button.  If you have any issues with the patch or you notice a red or orange light on it please call the company at [phone].  Once you remove the patch please mail it back to the company as soon as possible so we can get the results.    Follow-Up in: Please follow up with the Advanced [facility] Clinic in 1 month with Dr. Cherrie.    Thank you for choosing [facility].    At the [facility] Clinic, you and your health needs are our priority. We have a designated team specialized in the treatment of Heart Failure. This Care Team includes your primary Heart Failure Specialized Cardiologist (physician), Advanced Practice Providers (APPs- Physician Assistants and Nurse Practitioners), and Pharmacist who all work  together to provide you with the care you need, when you need it.   You may see any of the following providers on your designated Care Team at your next follow up:  Dr. Toribio Cherrie Dr. Ezra Shuck Dr. Ria Commander Dr. Morene Brownie Ellouise Class, FNP Jaun Bash, RPH-CPP  Please be sure to bring in all your medications bottles to every appointment.   Need to Contact Us :  If you have any questions or concerns before your next appointment please send us  a message through [place] or call our office at [phone].    TO LEAVE A MESSAGE FOR THE NURSE SELECT OPTION 2, PLEASE LEAVE A MESSAGE INCLUDING: YOUR NAME DATE OF BIRTH CALL BACK NUMBER REASON FOR CALL**this is important as we prioritize the call backs  YOU WILL RECEIVE A CALL BACK THE SAME DAY AS LONG AS YOU CALL BEFORE 4:00 PM

## 2024-08-24 NOTE — Progress Notes (Signed)
 ITAMAR home sleep study given to patient, all instructions explained, waiver signed, and CLOUDPAT registration complete.  Call wife when precerted  Zio patch placed onto patient.  All instructions and information reviewed with patient, they verbalize understanding with no questions.

## 2024-08-25 ENCOUNTER — Other Ambulatory Visit (HOSPITAL_COMMUNITY): Payer: Self-pay | Admitting: Internal Medicine

## 2024-08-25 ENCOUNTER — Telehealth: Payer: Self-pay

## 2024-08-25 ENCOUNTER — Inpatient Hospital Stay (HOSPITAL_COMMUNITY)
Admission: RE | Admit: 2024-08-25 | Discharge: 2024-08-25 | Disposition: A | Discharge status: Home / Self Care | Source: Ambulatory Visit | Attending: Internal Medicine | Admitting: Internal Medicine

## 2024-08-25 ENCOUNTER — Ambulatory Visit: Payer: Self-pay | Admitting: Family

## 2024-08-25 DIAGNOSIS — R001 Bradycardia, unspecified: Secondary | ICD-10-CM

## 2024-08-25 LAB — BASIC METABOLIC PANEL WITH GFR
BUN/Creatinine Ratio: 16 (ref 10–24)
BUN: 16 mg/dL (ref 8–27)
CO2: 22 mmol/L (ref 20–29)
Calcium: 9.5 mg/dL (ref 8.6–10.2)
Chloride: 104 mmol/L (ref 96–106)
Creatinine, Ser: 1.03 mg/dL (ref 0.76–1.27)
Glucose: 83 mg/dL (ref 70–99)
Potassium: 4.7 mmol/L (ref 3.5–5.2)
Sodium: 143 mmol/L (ref 134–144)
eGFR: 72 mL/min/1.73 (ref >59)

## 2024-08-25 NOTE — Telephone Encounter (Signed)
 Spoke to pt's wife per pt request. Made aware that he can complete the itamar sleep study at his convenience. No further questions at this time.

## 2024-08-25 NOTE — Telephone Encounter (Signed)
-----   Message from CMA Philicia B sent at 08/25/2024 11:37 AM EDT ----- Regarding: RE: itamar precert No pre cert required ----- Message ----- From: Sharl Laymon LABOR, RN Sent: 08/25/2024  11:19 AM EDT To: Fausto JONELLE Ross, CMA Subject: itamar precert                                  Test: itamar  Insurance: [employer]  CPT:  if known  Location: home  Dx: excessive daytime sleepiness  Provider: db  Scheduled Date:if known

## 2024-08-27 ENCOUNTER — Encounter: Payer: Self-pay | Admitting: Cardiology

## 2024-08-27 DIAGNOSIS — G4733 Obstructive sleep apnea (adult) (pediatric): Secondary | ICD-10-CM

## 2024-08-30 ENCOUNTER — Ambulatory Visit: Attending: Family

## 2024-08-30 DIAGNOSIS — G4733 Obstructive sleep apnea (adult) (pediatric): Secondary | ICD-10-CM

## 2024-08-30 NOTE — Procedures (Signed)
   SLEEP STUDY REPORT Patient Information Study Date: 08/27/2024 Patient Name: Micheal Wright Patient ID: [id number] Birth Date: Sep 07, 1941 Age: 83 Gender: BMI: 32.8 (W=185 lb, H=5' 3'') Stopbang: 4 Referring Physician: Ellouise Class, NP  TEST DESCRIPTION: Home sleep apnea testing was completed using the WatchPat, a Type 1 device, utilizing peripheral arterial tonometry (PAT), chest movement, actigraphy, pulse oximetry, pulse rate, body position and snore. AHI was calculated with apnea and hypopnea using valid sleep time as the denominator. RDI includes apneas, hypopneas, and RERAs. The data acquired and the scoring of sleep and all associated events were performed in accordance with the recommended standards and specifications as outlined in the AASM Manual for the Scoring of Sleep and Associated Events 2.2.0 (2015).  FINDINGS: 1. No evidence of Obstructive Sleep Apnea with AHI 1/hr. 2. No Central Sleep Apnea. 3. Oxygen desaturations as low as 90%. 4. Mild to moderate snoring was present. O2 sats were < 88% for 0 minutes. 5. Total sleep time was 4 hrs and 48 min. 6. 12.5% of total sleep time was spent in REM sleep. 7. Normal leep onset latency at 10 min. 8. Shortened REM sleep onset latency at 70 min. 9. Total awakenings were 11. 10. Arrhythmia Detection: Possible atrial fibrillation lasting 32 min and 18 seconds. This is not diagnostic and recommend further evaluation if clinically indicated.  DIAGNOSIS: Normal study with no significant sleep disordered breathing. Possible Atrial Fibrillation  RECOMMENDATIONS: 1. Normal study with no significant sleep disordered breathing. 2. Healthy sleep recommendations include: adequate nightly sleep (normal 7-9 hrs/night), avoidance of caffeine after noon and alcohol  near bedtime, and maintaining a sleep environment that is cool, dark and quiet. 3. Weight loss for overweight patients is recommended. 4. Snoring recommendations include: weight  loss where appropriate, side sleeping, and avoidance of alcohol  before bed. 5. Operation of motor vehicle or dangerous equipment must be avoided when feeling drowsy, excessively sleepy, or mentally fatigued. 6. An ENT consultation which may be useful for specific causes of and possible treatment of bothersome snoring . 7. Weight loss may be of benefit in reducing the severity of snoring.   Signature: Wilbert Bihari, MD; [facility]; Diplomat, American Board of Sleep Medicine Electronically Signed: 08/30/2024 12:18:40 PM

## 2024-09-01 ENCOUNTER — Telehealth: Payer: Self-pay | Admitting: *Deleted

## 2024-09-01 NOTE — Telephone Encounter (Signed)
 The patient has been notified of the result and verbalized understanding.  All questions (if any) were answered. Joshua Dalton Seip, CMA 09/01/2024 3:55 PM    Pt is agreeable to normal results.

## 2024-09-01 NOTE — Telephone Encounter (Signed)
-----   Message from Micheal Wright sent at 08/30/2024 12:19 PM EDT ----- Please let patient know that sleep study showed no significant sleep apnea.

## 2024-09-03 DIAGNOSIS — R001 Bradycardia, unspecified: Secondary | ICD-10-CM | POA: Diagnosis not present

## 2024-09-04 ENCOUNTER — Telehealth: Payer: Self-pay | Admitting: Physician Assistant

## 2024-09-04 NOTE — Telephone Encounter (Signed)
 Patient has a history of PAF and is on Eliquis .  [employer] called because he had some A-fib on his monitor.  Shona Shad, PA-C 09/04/2024 5:50 PM

## 2024-09-09 DIAGNOSIS — L570 Actinic keratosis: Secondary | ICD-10-CM | POA: Diagnosis not present

## 2024-09-09 DIAGNOSIS — L821 Other seborrheic keratosis: Secondary | ICD-10-CM | POA: Diagnosis not present

## 2024-09-09 DIAGNOSIS — D235 Other benign neoplasm of skin of trunk: Secondary | ICD-10-CM | POA: Diagnosis not present

## 2024-09-09 DIAGNOSIS — L728 Other follicular cysts of the skin and subcutaneous tissue: Secondary | ICD-10-CM | POA: Diagnosis not present

## 2024-09-09 DIAGNOSIS — D0439 Carcinoma in situ of skin of other parts of face: Secondary | ICD-10-CM | POA: Diagnosis not present

## 2024-09-09 DIAGNOSIS — D0461 Carcinoma in situ of skin of right upper limb, including shoulder: Secondary | ICD-10-CM | POA: Diagnosis not present

## 2024-09-09 DIAGNOSIS — L718 Other rosacea: Secondary | ICD-10-CM | POA: Diagnosis not present

## 2024-09-09 DIAGNOSIS — D485 Neoplasm of uncertain behavior of skin: Secondary | ICD-10-CM | POA: Diagnosis not present

## 2024-09-09 DIAGNOSIS — D045 Carcinoma in situ of skin of trunk: Secondary | ICD-10-CM | POA: Diagnosis not present

## 2024-09-09 DIAGNOSIS — Z85828 Personal history of other malignant neoplasm of skin: Secondary | ICD-10-CM | POA: Diagnosis not present

## 2024-09-09 DIAGNOSIS — D1801 Hemangioma of skin and subcutaneous tissue: Secondary | ICD-10-CM | POA: Diagnosis not present

## 2024-09-20 ENCOUNTER — Telehealth (HOSPITAL_COMMUNITY): Payer: Self-pay | Admitting: Cardiology

## 2024-09-20 MED ORDER — AMLODIPINE BESYLATE 5 MG PO TABS: 5.0000 mg | ORAL_TABLET | Freq: Every day | ORAL | 3 refills | Status: DC

## 2024-09-20 NOTE — Telephone Encounter (Signed)
 Patients wife called to report pt needed to restart amlodipine  over the past 2-3 days  Reports amlodipine  was d/c'd due to  low HR (30-40) awhile back  Pt was experiencing HA so he checked BP 173/84. Pts wife advised to restart amlodipine   10/19 173/84  10/20 162/91 Recheck b/p 10/20 152/81  No change to HR Sedentary HR 30-40, active HR 50-70   Pts wife only notifying office as FYI in the event additional changes are needed prior to OV 10/22

## 2024-09-22 ENCOUNTER — Telehealth: Payer: Self-pay | Admitting: Adult Health

## 2024-09-22 ENCOUNTER — Other Ambulatory Visit: Payer: Self-pay | Admitting: Internal Medicine

## 2024-09-22 DIAGNOSIS — I2510 Atherosclerotic heart disease of native coronary artery without angina pectoris: Secondary | ICD-10-CM

## 2024-09-22 NOTE — Telephone Encounter (Signed)
 Called to confirm/remind patient of their appointment at the [facility] on 09/23/24.   Appointment:   [] Confirmed  [] Left mess   [] No answer/No voice mail  [x] VM Full/unable to leave message  [] Phone not in service  Patient reminded to bring all medications and/or complete list.  Confirmed patient has transportation. Gave directions, instructed to utilize valet parking.

## 2024-09-23 ENCOUNTER — Encounter: Payer: Self-pay | Admitting: Adult Health

## 2024-09-23 ENCOUNTER — Ambulatory Visit: Attending: Internal Medicine | Admitting: Adult Health

## 2024-09-23 ENCOUNTER — Other Ambulatory Visit: Payer: Self-pay

## 2024-09-23 VITALS — BP 143/59 | HR 45 | Ht 70.0 in | Wt 188.5 lb

## 2024-09-23 DIAGNOSIS — R9439 Abnormal result of other cardiovascular function study: Secondary | ICD-10-CM | POA: Insufficient documentation

## 2024-09-23 DIAGNOSIS — I480 Paroxysmal atrial fibrillation: Secondary | ICD-10-CM | POA: Diagnosis not present

## 2024-09-23 DIAGNOSIS — I110 Hypertensive heart disease with heart failure: Secondary | ICD-10-CM | POA: Insufficient documentation

## 2024-09-23 DIAGNOSIS — I4891 Unspecified atrial fibrillation: Secondary | ICD-10-CM | POA: Diagnosis not present

## 2024-09-23 DIAGNOSIS — I2510 Atherosclerotic heart disease of native coronary artery without angina pectoris: Secondary | ICD-10-CM | POA: Insufficient documentation

## 2024-09-23 DIAGNOSIS — Z79899 Other long term (current) drug therapy: Secondary | ICD-10-CM | POA: Diagnosis not present

## 2024-09-23 DIAGNOSIS — I5032 Chronic diastolic (congestive) heart failure: Secondary | ICD-10-CM

## 2024-09-23 DIAGNOSIS — I714 Abdominal aortic aneurysm, without rupture, unspecified: Secondary | ICD-10-CM | POA: Diagnosis not present

## 2024-09-23 DIAGNOSIS — E875 Hyperkalemia: Secondary | ICD-10-CM | POA: Insufficient documentation

## 2024-09-23 DIAGNOSIS — Z974 Presence of external hearing-aid: Secondary | ICD-10-CM | POA: Insufficient documentation

## 2024-09-23 DIAGNOSIS — R000 Tachycardia, unspecified: Secondary | ICD-10-CM | POA: Insufficient documentation

## 2024-09-23 DIAGNOSIS — G4733 Obstructive sleep apnea (adult) (pediatric): Secondary | ICD-10-CM | POA: Diagnosis not present

## 2024-09-23 DIAGNOSIS — I495 Sick sinus syndrome: Secondary | ICD-10-CM | POA: Insufficient documentation

## 2024-09-23 DIAGNOSIS — R001 Bradycardia, unspecified: Secondary | ICD-10-CM | POA: Diagnosis not present

## 2024-09-23 DIAGNOSIS — Z7901 Long term (current) use of anticoagulants: Secondary | ICD-10-CM | POA: Diagnosis not present

## 2024-09-23 DIAGNOSIS — I482 Chronic atrial fibrillation, unspecified: Secondary | ICD-10-CM | POA: Insufficient documentation

## 2024-09-23 DIAGNOSIS — Z951 Presence of aortocoronary bypass graft: Secondary | ICD-10-CM | POA: Insufficient documentation

## 2024-09-23 DIAGNOSIS — E859 Amyloidosis, unspecified: Secondary | ICD-10-CM | POA: Insufficient documentation

## 2024-09-23 DIAGNOSIS — I4811 Longstanding persistent atrial fibrillation: Secondary | ICD-10-CM | POA: Diagnosis not present

## 2024-09-23 DIAGNOSIS — I6529 Occlusion and stenosis of unspecified carotid artery: Secondary | ICD-10-CM | POA: Diagnosis not present

## 2024-09-23 MED ORDER — LOSARTAN POTASSIUM 100 MG PO TABS: 100.0000 mg | ORAL_TABLET | Freq: Every day | ORAL | 3 refills | Status: AC

## 2024-09-23 MED ORDER — NITROGLYCERIN 0.4 MG SL SUBL: SUBLINGUAL_TABLET | SUBLINGUAL | 3 refills | Status: AC

## 2024-09-23 NOTE — Patient Instructions (Signed)
 Medication Changes:  STOP Amlodipine   INCREASE Losartan  100mg  (1 tab) daily  REFILLED Nitro  Lab Work:  Go over to the [facility]. Go pass the gift shop and have your blood work completed IN ONE WEEK.  We will only call you if the results are abnormal or if the provider would like to make medication changes.  No news is good news.   Follow-Up in: Please follow up with the [facility] in 6 weeks with Dr. Cherrie in [place].    Thank you for choosing [facility].    At the [facility], you and your health needs are our priority. We have a designated team specialized in the treatment of Heart Failure. This Care Team includes your primary Heart Failure Specialized Cardiologist (physician), Advanced Practice Providers (APPs- Physician Assistants and Nurse Practitioners), and Pharmacist who all work together to provide you with the care you need, when you need it.   You may see any of the following providers on your designated Care Team at your next follow up:  Dr. Toribio Cherrie Dr. Ezra Shuck Dr. Ria Commander Dr. Morene Brownie Ellouise Class, FNP Jaun Bash, RPH-CPP  Please be sure to bring in all your medications bottles to every appointment.   Need to Contact Us :  If you have any questions or concerns before your next appointment please send us  a message through [place] or call our office at [phone].    TO LEAVE A MESSAGE FOR THE NURSE SELECT OPTION 2, PLEASE LEAVE A MESSAGE INCLUDING: YOUR NAME DATE OF BIRTH CALL BACK NUMBER REASON FOR CALL**this is important as we prioritize the call backs  YOU WILL RECEIVE A CALL BACK THE SAME DAY AS LONG AS YOU CALL BEFORE 4:00 PM

## 2024-09-23 NOTE — Progress Notes (Signed)
 Placed order for EP referral per Bensimhon for pauses on zio monitor.

## 2024-09-23 NOTE — Progress Notes (Signed)
 [facility] Note   PCP: Dr Bertrum HF Provider: Cherrie Share, MD  Chief Complaint: fatigue   HPI: Micheal Wright is a 83 y.o. male with a history of coronary artery disease status post coronary artery bypass grafting in 1998.  Last catheterization was in January 2002 due to an abnormal stress test with 4 mm of ST elevation during exercise.  Cardiac catheterization showed patent grafts.  He has a history of hypertension, PAF, hyperlipidemia, and sleep apnea. Myoview 2/09 EF 61% No ischemia.   Had episode PAF in 12/16 during colonoscopy. Underwent DC-CV. Started Eliquis .   Echo 8/22: EF 60-65%. RV low normal RVSP ok. Personally reviewed  Echo 09/02/23 EF 60% mild Aov sclerosis   cMRI 07/07/24: LVEF 62%, small area of basal anterolateral subepicardial late gadolinium enhancement, RV 57%. Not suggestive of cardiac amyloidosis.   Zio 08/2024  6 Ventricular Tachycardia runs occurred, the run with the fastest interval lasting 7 beats with a max rate of 174 bpm (avg 132 bpm); the run with the fastest interval was also the longest. Atrial Fibrillation occurred continuously (100% burden), ranging  from 26-164 bpm (avg of 54 bpm). 194 Pauses occurred, the longest lasting 4.2 secs (14 bpm). Isolated VEs were rare (<1.0%, 1409), VE Couplets were rare (<1.0%, 69), and VE Triplets were rare (<1.0%, 4). Referred to EP  08/2024 Sleep Study - no sleep apnea  Today he returns for HF follow up with his wife. Overall feeling fine but remains anxious about his heart rate dropping at night. Denies SOB/PND/Orthopnea. No chest pain. No syncope. Gets a little lightheaded when he leans back. Appetite ok. No fever or chills. Weight at home stables. Taking all medications. BP at home 140-160. His wife restarted amlodipine  5 mg daily after his BP was running high at home Heart rate at home on his apple watch 30-80s with an average rate 44. Heart drops at night or at rest.    Studies: Sleep Study   04/03/2018 . CPAP Titration completed 04/14/2018   PYP 03/26/2018   H/CL 1.14 Visual grade 2. Equivocal  cMRI 5/19: EF 52% mid myocardial gadolinium uptake on delayed inversion recovery sequences in the septum, apex and inferior base. Reviewed by Drs. Maranda and [place] and not felt c/w amyloid  Echo 03/02/2018  LVEF 55-60% Mild RV dilation. Mild to mod TR. RVSP not elevated on echo. Personally reviewed  Echo 01/2015 EF 60-65%, no AR, Mild MR/TR, PA peak pressure 33 mmHg, GLPSS -21% LA size 3.8cm Carotid US  01/2015 with minimal plaque bilaterally.  Carotid u/s 11/2012  R ok 40-59% (stable).                       3/16: 1-39% bilaterally ABI 12/2009  R 1.0  L 1.1    Review of systems complete and found to be negative unless listed in HPI.    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    Atrial complex, premature 05/04/2014   CAD (coronary artery disease)    s/p CABG 1998; Last cath 2002 in setting of marked ST elevation during stress testing. patent grafts; Echo 8/09 EF 55%. No significant valvular disease.   Cardiac murmur 09/03/2011   Overview:  Overview:  Last Assessment & Plan:  Likely mild aortic valve sclerosis. Will check echo.  Carotid artery disease    u/s 9/10: R 40-59% L 0-39%   Carotid artery obstruction 08/22/2009   Overview:  Overview:  Qualifier: Diagnosis of  By: Cherrie, MD, CODY Toribio SAUNDERS  Last Assessment & Plan:  Asymptomatic. Due for repeat u/s.  Overview:  Overview:  Qualifier: Diagnosis of  By: Cherrie, MD, CODY Toribio SAUNDERS  Last Assessment & Plan:  Asymptomatic. Due for repeat u/s.     Carotid stenosis 08/22/2009   Qualifier: Diagnosis of  By: Cherrie, MD, CODY Toribio SAUNDERS    Cerebrovascular disease 11/17/2008   Chest tightness 01/20/2015   CHF (congestive heart failure) (HCC)    Chronic atrial fibrillation  (HCC) 03/02/2018   Coronary artery disease 05/11/2007   Qualifier: Diagnosis of  By: Henrietta LPN, Angeline     Erectile dysfunction    Essential hypertension 05/11/2007   Overview:  Overview:  Overview:  Qualifier: Diagnosis of  By: Henrietta LPN, Regina   Last Assessment & Plan:  BP up slightly here but when he checks at [employer] usually 120/65. Will continue current regimen.  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 90 tablet 3   ELIQUIS  5 MG TABS tablet TAKE 1 TABLET BY MOUTH TWICE A DAY 180 tablet 2   gabapentin (NEURONTIN) 300 MG capsule Take 300 mg by mouth daily.     hydrochlorothiazide  (MICROZIDE ) 12.5 MG capsule TAKE 1 CAPSULE BY MOUTH EVERY DAY 90 capsule 2   omeprazole (PRILOSEC) 40 MG capsule TAKE 1 CAPSULE BY MOUTH ONCE DAILY (Patient taking differently: Take 20 mg by mouth daily.) 90 capsule 0   Alpha Lipoic Acid 200 MG CAPS Take 600 mg by mouth daily. (Patient not taking: Reported on 09/23/2024)     losartan  (COZAAR ) 100 MG tablet Take 1 tablet (100 mg total) by mouth daily. 90 tablet 3   nitroGLYCERIN  (NITROSTAT ) 0.4 MG SL tablet DISSOLVE 1 TABLET UNDER THE TONGUE FOR CHEST PAIN. MAY REPEAT EVERY UP TO 3 DOSES. IF NO RELIEF, CALL 911** 25 tablet 3   No current facility-administered medications for this visit.     PHYSICAL  EXAM: General:   No resp difficulty Neck: no JVD.  Cor: Irregular rate & rhythm.  Lungs: clear Abdomen: soft, nontender, nondistended.  Extremities: no  edema Neuro: alert & oriented x3   ECG : A fib 54 bpm   Vitals:   09/23/24 1340  BP: (!) 143/59  Pulse: (!) 45  SpO2: 99%  Weight: 188 lb 8 oz (85.5 kg)  Height: 5' 10 (1.778 m)    Wt Readings from Last 3 Encounters:  09/23/24 188 lb 8 oz (85.5 kg)  08/24/24 185 lb 3.2 oz (84 kg)  05/06/24 187 lb (84.8 kg)   Lab  Results  Component Value Date   CREATININE 1.03 08/24/2024   CREATININE 1.12 08/07/2022   CREATININE 1.24 06/07/2021    ASSESSMENT & PLAN:  1) Lower Extremtiy edema  - Resolved with addition of HCTZ - likely mixed picture between dependent edema (with LE varicosities) but also has some RV strain on echo and MRI  - unable to use spiro due to hyperkalemia.  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 Quanitive 1.14 - cMRI 5/19: LVEF 52% mid myocardial gadolinium uptake on delayed inversion recovery sequences in the septum, apex and inferior base. RV 28%  Reviewed by Drs. Maranda and [place] and not felt c/w amyloid  - Resolved - Echo 09/02/23 EF 60% mild Aov sclerosis  - cMRI 07/07/24: LVEF 62%, small area of basal anterolateral subepicardial late gadolinium enhancement, RV 57%. Not suggestive of cardiac amyloidosis.   2) Tachy brady Chronic a fib.  -  Continues on Eliquis . No bleeding  - CHADS VASC 4 (age =2. CAD, HTN) - zio 08/2025  with 194 Pauses occurred, the longest lasting 4.2 secs (14 bpm).  - Refer to EP today.   3) CAD, s/p CABG 1998 - Myoview 3/16 OK - No chest pain. Refill nitroglycerin .  - Continue Eliquis /statin - LDL 39 in 12/24 (Followed by Dr. Bertrum)  4) HTN - BP Elevated.  - Stop amlodipine . Increase losartan  100 mg daily. Chec BMET in 7 days.    5) HL - Managed by Dr. Bertrum. Goal LDL < 70 - LDL 39 in 12/24  6) Carotid stenosis, mild - US  Carotid 05/28/2017 Bilateral 1-39%. -  u/s carotid 11/21 1-39%   7) Sleep Apnea - Completed sleep study 04/2018 Mild OSA AHI 6.1 - 08/2024 [place] Sleep study- no sleep apnea.   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 Instructed to call 911 if he has syncope.  Follow up with Dr Cherrie at [facility].    Greig Mosses, NP  09/23/24

## 2024-09-29 ENCOUNTER — Ambulatory Visit (HOSPITAL_COMMUNITY): Payer: Self-pay | Admitting: Adult Health

## 2024-09-29 ENCOUNTER — Other Ambulatory Visit
Admission: RE | Admit: 2024-09-29 | Discharge: 2024-09-29 | Disposition: A | Discharge status: Home / Self Care | Source: Ambulatory Visit | Attending: Adult Health | Admitting: Adult Health

## 2024-09-29 DIAGNOSIS — I5032 Chronic diastolic (congestive) heart failure: Secondary | ICD-10-CM | POA: Insufficient documentation

## 2024-09-29 LAB — BASIC METABOLIC PANEL WITH GFR
Anion gap: 8 (ref 5–15)
BUN: 21 mg/dL (ref 8–23)
CO2: 28 mmol/L (ref 22–32)
Calcium: 9.5 mg/dL (ref 8.9–10.3)
Chloride: 106 mmol/L (ref 98–111)
Creatinine, Ser: 1.19 mg/dL (ref 0.61–1.24)
GFR, Estimated: >60 mL/min (ref >60)
Glucose, Bld: 98 mg/dL (ref 70–99)
Potassium: 4.9 mmol/L (ref 3.5–5.1)
Sodium: 142 mmol/L (ref 135–145)

## 2024-10-14 DIAGNOSIS — D0439 Carcinoma in situ of skin of other parts of face: Secondary | ICD-10-CM | POA: Diagnosis not present

## 2024-10-14 DIAGNOSIS — L570 Actinic keratosis: Secondary | ICD-10-CM | POA: Diagnosis not present

## 2024-10-14 DIAGNOSIS — D0461 Carcinoma in situ of skin of right upper limb, including shoulder: Secondary | ICD-10-CM | POA: Diagnosis not present

## 2024-10-14 DIAGNOSIS — D044 Carcinoma in situ of skin of scalp and neck: Secondary | ICD-10-CM | POA: Diagnosis not present

## 2024-10-18 ENCOUNTER — Encounter (HOSPITAL_COMMUNITY): Payer: Self-pay | Admitting: Internal Medicine

## 2024-10-19 MED ORDER — DOXAZOSIN MESYLATE 2 MG PO TABS: 2.0000 mg | ORAL_TABLET | Freq: Every day | ORAL | 3 refills | Status: DC

## 2024-10-20 ENCOUNTER — Telehealth (HOSPITAL_COMMUNITY): Payer: Self-pay | Admitting: Cardiology

## 2024-10-20 NOTE — Telephone Encounter (Signed)
 Patient called to express concerns about stating new medication cardura. Reports in the medication inset it is noted to lower HR. Reports his HR is already on the lower side (30's-40's)  Would like to confirm if its ok to start? Requests a call from Dr Cherrie to discuss

## 2024-10-26 ENCOUNTER — Encounter (HOSPITAL_COMMUNITY): Payer: Self-pay | Admitting: Internal Medicine

## 2024-10-27 NOTE — Telephone Encounter (Signed)
 Multiple attempts to contact patient, all were unsuccessful Will close encounter

## 2024-10-29 ENCOUNTER — Encounter (HOSPITAL_COMMUNITY): Payer: Self-pay | Admitting: Internal Medicine

## 2024-10-29 DIAGNOSIS — I10 Essential (primary) hypertension: Secondary | ICD-10-CM

## 2024-11-01 DIAGNOSIS — J40 Bronchitis, not specified as acute or chronic: Secondary | ICD-10-CM | POA: Diagnosis not present

## 2024-11-02 MED ORDER — DOXAZOSIN MESYLATE 2 MG PO TABS: 4.0000 mg | ORAL_TABLET | Freq: Every day | ORAL | 3 refills | Status: AC

## 2024-11-04 ENCOUNTER — Encounter (HOSPITAL_COMMUNITY): Payer: Self-pay | Admitting: Internal Medicine

## 2024-11-04 ENCOUNTER — Ambulatory Visit (HOSPITAL_COMMUNITY)
Admission: RE | Admit: 2024-11-04 | Discharge: 2024-11-04 | Disposition: A | Source: Ambulatory Visit | Attending: Internal Medicine | Admitting: Internal Medicine

## 2024-11-04 VITALS — BP 134/72 | HR 80 | Ht 70.0 in | Wt 192.6 lb

## 2024-11-04 DIAGNOSIS — I472 Ventricular tachycardia, unspecified: Secondary | ICD-10-CM | POA: Diagnosis not present

## 2024-11-04 DIAGNOSIS — I10 Essential (primary) hypertension: Secondary | ICD-10-CM | POA: Diagnosis not present

## 2024-11-04 DIAGNOSIS — F419 Anxiety disorder, unspecified: Secondary | ICD-10-CM | POA: Diagnosis not present

## 2024-11-04 DIAGNOSIS — I495 Sick sinus syndrome: Secondary | ICD-10-CM | POA: Diagnosis not present

## 2024-11-04 DIAGNOSIS — I480 Paroxysmal atrial fibrillation: Secondary | ICD-10-CM | POA: Diagnosis not present

## 2024-11-04 DIAGNOSIS — E875 Hyperkalemia: Secondary | ICD-10-CM | POA: Diagnosis not present

## 2024-11-04 DIAGNOSIS — G4733 Obstructive sleep apnea (adult) (pediatric): Secondary | ICD-10-CM | POA: Diagnosis not present

## 2024-11-04 DIAGNOSIS — I2510 Atherosclerotic heart disease of native coronary artery without angina pectoris: Secondary | ICD-10-CM

## 2024-11-04 DIAGNOSIS — I509 Heart failure, unspecified: Secondary | ICD-10-CM | POA: Diagnosis not present

## 2024-11-04 DIAGNOSIS — I6529 Occlusion and stenosis of unspecified carotid artery: Secondary | ICD-10-CM | POA: Diagnosis not present

## 2024-11-04 DIAGNOSIS — I4811 Longstanding persistent atrial fibrillation: Secondary | ICD-10-CM | POA: Diagnosis not present

## 2024-11-04 DIAGNOSIS — I110 Hypertensive heart disease with heart failure: Secondary | ICD-10-CM | POA: Diagnosis not present

## 2024-11-04 DIAGNOSIS — I714 Abdominal aortic aneurysm, without rupture, unspecified: Secondary | ICD-10-CM | POA: Diagnosis not present

## 2024-11-04 DIAGNOSIS — Z7901 Long term (current) use of anticoagulants: Secondary | ICD-10-CM | POA: Diagnosis not present

## 2024-11-04 DIAGNOSIS — Z951 Presence of aortocoronary bypass graft: Secondary | ICD-10-CM | POA: Diagnosis not present

## 2024-11-04 NOTE — Patient Instructions (Addendum)
 Medication Changes:  None, continue current medications   Special Instructions // Education:  Do the following things EVERYDAY: Weigh yourself in the morning before breakfast. Write it down and keep it in a log. Take your medicines as prescribed Eat low salt foods--Limit salt (sodium) to 2000 mg per day.  Stay as active as you can everyday Limit all fluids for the day to less than 2 liters  **Get a new BP cuff for home use   Follow-Up in: 6 months (June 2026), **PLEASE CALL OUR OFFICE IN [place] TO SCHEDULE THIS APPOINTMENT    At the [facility], you and your health needs are our priority. We have a designated team specialized in the treatment of Heart Failure. This Care Team includes your primary Heart Failure Specialized Cardiologist (physician), Advanced Practice Providers (APPs- Physician Assistants and Nurse Practitioners), and Pharmacist who all work together to provide you with the care you need, when you need it.   You may see any of the following providers on your designated Care Team at your next follow up:  Dr. Toribio Fuel Dr. Ezra Shuck Dr. Odis Brownie Greig Mosses, NP Caffie Shed, [place] [facility] [place], [place] Beckey Coe, NP Jordan Lee, NP Tinnie Redman, PharmD   Please be sure to bring in all your medications bottles to every appointment.   Need to Contact Us :  If you have any questions or concerns before your next appointment please send us  a message through [place] or call our office at [phone].    TO LEAVE A MESSAGE FOR THE NURSE SELECT OPTION 2, PLEASE LEAVE A MESSAGE INCLUDING: YOUR NAME DATE OF BIRTH CALL BACK NUMBER REASON FOR CALL**this is important as we prioritize the call backs  YOU WILL RECEIVE A CALL BACK THE SAME DAY AS LONG AS YOU CALL BEFORE 4:00 PM

## 2024-11-04 NOTE — Progress Notes (Signed)
 Advanced Heart Failure Clinic Note   PCP: Dr Bertrum HF Provider: Cherrie Share, MD  Chief Complaint: HF   HPI: Micheal Wright is a 83 y.o. male with a history of coronary artery disease status post coronary artery bypass grafting in 1998.  Last catheterization was in January 2002 due to an abnormal stress test with 4 mm of ST elevation during exercise.  Cardiac catheterization showed patent grafts.  He has a history of hypertension, PAF, hyperlipidemia, and sleep apnea. Myoview 2/09 EF 61% No ischemia.   Had episode PAF in 12/16 during colonoscopy. Underwent DC-CV. Started Eliquis .   Echo 8/22: EF 60-65%. RV low normal RVSP ok. Personally reviewed  Echo 09/02/23 EF 60% mild Aov sclerosis   cMRI 07/07/24: LVEF 62%, small area of basal anterolateral subepicardial late gadolinium enhancement, RV 57%. Not suggestive of cardiac amyloidosis.   Zio 08/2024  6 Ventricular Tachycardia runs occurred, the run with the fastest interval lasting 7 beats with a max rate of 174 bpm (avg 132 bpm); the run with the fastest interval was also the longest. Atrial Fibrillation occurred continuously (100% burden), ranging  from 26-164 bpm (avg of 54 bpm). 194 Pauses occurred, the longest lasting 4.2 secs (14 bpm). Isolated VEs were rare (<1.0%, 1409), VE Couplets were rare (<1.0%, 69), and VE Triplets were rare (<1.0%, 4). Referred to EP  08/2024 Sleep Study - no sleep apnea  Today he returns for HF follow up with his wife. Recently been struggling with HTN. Started on doxazosin . Now up to 4mg /day. SBP still elevated SBP ranges 150-170. 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 BP with [employer] cuff was 128/70. At Dr. Chaya office SBP was in 120s.  Pending referral to HTN clinic. Continues to be concerned about his low HR. Says HR goes into 30s at night. During day HR 40s during day. HR goes to 135-140 when he is working. No syncope or presyncope. No edema, CP orthopnea or PND. Works hard  in the yard.    Studies: Sleep Study  04/03/2018 . CPAP Titration completed 04/14/2018   PYP 03/26/2018   H/CL 1.14 Visual grade 2. Equivocal  cMRI 5/19: EF 52% mid myocardial gadolinium uptake on delayed inversion recovery sequences in the septum, apex and inferior base. Reviewed by Drs. Maranda and [place] and not felt c/w amyloid  Echo 03/02/2018  LVEF 55-60% Mild RV dilation. Mild to mod TR. RVSP not elevated on echo. Personally reviewed  Echo 01/2015 EF 60-65%, no AR, Mild MR/TR, PA peak pressure 33 mmHg, GLPSS -21% LA size 3.8cm Carotid US  01/2015 with minimal plaque bilaterally.  Carotid u/s 11/2012  R ok 40-59% (stable).                       3/16: 1-39% bilaterally ABI 12/2009  R 1.0  L 1.1    Review of systems complete and found to be negative unless listed in HPI.    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    Atrial complex, premature 05/04/2014   CAD (coronary artery disease)    s/p CABG 1998; Last cath 2002 in setting of marked ST elevation during stress testing. patent grafts; Echo 8/09 EF 55%. No significant valvular disease.   Cardiac murmur 09/03/2011   Overview:  Overview:  Last Assessment &  Plan:  Likely mild aortic valve sclerosis. Will check echo.     Carotid artery disease    u/s 9/10: R 40-59% L 0-39%   Carotid artery obstruction 08/22/2009   Overview:  Overview:  Qualifier: Diagnosis of  By: Cherrie, MD, CODY Toribio SAUNDERS  Last Assessment & Plan:  Asymptomatic. Due for repeat u/s.  Overview:  Overview:  Qualifier: Diagnosis of  By: Cherrie, MD, CODY Toribio SAUNDERS  Last Assessment & Plan:  Asymptomatic. Due for repeat u/s.     Carotid stenosis 08/22/2009   Qualifier: Diagnosis of  By: Cherrie, MD, CODY Toribio SAUNDERS    Cerebrovascular disease 11/17/2008   Chest tightness 01/20/2015   CHF (congestive heart  failure) (HCC)    Chronic atrial fibrillation (HCC) 03/02/2018   Coronary artery disease 05/11/2007   Qualifier: Diagnosis of  By: Henrietta LPN, Angeline     Erectile dysfunction    Essential hypertension 05/11/2007   Overview:  Overview:  Overview:  Qualifier: Diagnosis of  By: Henrietta LPN, Regina   Last Assessment & Plan:  BP up slightly here but when he checks at [employer] usually 120/65. Will continue current regimen.  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 60 tablet 3   ELIQUIS  5 MG TABS tablet TAKE 1 TABLET BY MOUTH TWICE A DAY 180 tablet 2   gabapentin (NEURONTIN) 300 MG capsule Take 300 mg by mouth daily.     hydrochlorothiazide  (MICROZIDE ) 12.5 MG capsule TAKE 1 CAPSULE BY MOUTH EVERY DAY 90 capsule 2   losartan  (COZAAR ) 100 MG tablet Take 1 tablet (100 mg total) by mouth daily. 90 tablet 3   nitroGLYCERIN  (NITROSTAT ) 0.4 MG SL tablet DISSOLVE 1 TABLET UNDER THE TONGUE FOR CHEST PAIN. MAY REPEAT EVERY UP TO 3 DOSES. IF NO RELIEF, CALL 911** 25 tablet 3   Alpha Lipoic Acid 200 MG CAPS Take 600 mg by mouth daily. (Patient not taking: Reported on 09/23/2024)     omeprazole (PRILOSEC) 40 MG capsule TAKE 1 CAPSULE BY MOUTH ONCE DAILY (Patient taking differently:  Take 20 mg by mouth daily.) 90 capsule 0   No current facility-administered medications for this encounter.   Vitals:   11/04/24 1548  BP: 128/72  Pulse: 80  SpO2: 96%  Weight: 87.4 kg (192 lb 9.6 oz)  Height: 5' 10 (1.778 m)      PHYSICAL EXAM: General:  Sitting up. No resp difficulty HEENT: normal Neck: supple. no JVD.  Cor: Regular rate & rhythm. No rubs, gallops or murmurs. Lungs: clear Abdomen: soft, nontender, nondistended.Good bowel sounds. Extremities: no cyanosis, clubbing, rash, edema Neuro: alert & orientedx3, cranial nerves  grossly intact. moves all 4 extremities w/o difficulty. Affect pleasant    ECG : A fib 61 bpm   Vitals:   11/04/24 1548  BP: 128/72  Pulse: 80  SpO2: 96%  Weight: 87.4 kg (192 lb 9.6 oz)  Height: 5' 10 (1.778 m)    Wt Readings from Last 3 Encounters:  11/04/24 87.4 kg (192 lb 9.6 oz)  09/23/24 85.5 kg (188 lb 8 oz)  08/24/24 84 kg (185 lb 3.2 oz)   Lab Results  Component Value Date   CREATININE 1.19 09/29/2024   CREATININE 1.03 08/24/2024   CREATININE 1.12 08/07/2022    ASSESSMENT & PLAN:   1) HTN - Suspect home BP is inaccurate. BP at ll MD visits and [facility] department was ok with systolics in 120 - Asked him to get new cuff - Can consider 24hr ABPM  2) Tachy brady Chronic a fib.  -  Continues on Eliquis . No bleeding  - CHADS VASC 4 (age =2. CAD, HTN) - zio 08/2025  with 194 Pauses occurred, the longest lasting 4.2 secs (14 bpm).  - Has nightime bradycardia with good HR response to exercise. No syncope/presyncope. Continue watchful waiting - He wants second opinion. Has referral EP - He has significant anxiety over this. I did my best to reassure him   3) CAD, s/p CABG 1998 - Myoview 3/16 OK - No s/s angina - Continue Eliquis /statin - LDL 39 in 12/24 (Followed by Dr. Bertrum)  TOMY) Lower Extremtiy edema  - Resolved with addition of HCTZ - unable to use spiro due to hyperkalemia.  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 Quanitive 1.14 - cMRI 5/19: LVEF 52% mid myocardial gadolinium uptake on delayed inversion recovery sequences in the septum, apex and inferior base. RV 28%  Reviewed by Drs. Maranda and [place] and not felt c/w amyloid  - Resolved - Echo 09/02/23 EF 60% mild Aov sclerosis  - cMRI 07/07/24: LVEF 62%, small area of basal anterolateral subepicardial late gadolinium enhancement, RV 57%. Not suggestive of cardiac amyloidosis.   5) HL - Managed by Dr. Bertrum. Goal LDL < 70 - LDL 39 in 12/24  6) Carotid stenosis, mild - US  Carotid 05/28/2017 Bilateral 1-39%. - u/s carotid 11/21 1-39%   7) Sleep Apnea - Completed sleep study 04/2018 Mild OSA AHI 6.1 - 08/2024 [place] Sleep study- no sleep apnea.   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

## 2024-12-09 ENCOUNTER — Encounter (HOSPITAL_BASED_OUTPATIENT_CLINIC_OR_DEPARTMENT_OTHER): Payer: Self-pay | Admitting: Family

## 2024-12-09 ENCOUNTER — Ambulatory Visit (HOSPITAL_BASED_OUTPATIENT_CLINIC_OR_DEPARTMENT_OTHER): Admitting: Family

## 2024-12-09 VITALS — BP 118/68 | HR 83 | Ht 70.0 in | Wt 190.0 lb

## 2024-12-09 DIAGNOSIS — E785 Hyperlipidemia, unspecified: Secondary | ICD-10-CM | POA: Diagnosis not present

## 2024-12-09 DIAGNOSIS — D6859 Other primary thrombophilia: Secondary | ICD-10-CM

## 2024-12-09 DIAGNOSIS — I10 Essential (primary) hypertension: Secondary | ICD-10-CM | POA: Diagnosis not present

## 2024-12-09 DIAGNOSIS — I25118 Atherosclerotic heart disease of native coronary artery with other forms of angina pectoris: Secondary | ICD-10-CM | POA: Diagnosis not present

## 2024-12-09 DIAGNOSIS — I482 Chronic atrial fibrillation, unspecified: Secondary | ICD-10-CM | POA: Diagnosis not present

## 2024-12-09 NOTE — Patient Instructions (Signed)
 Medication Instructions:  Continue your current medications    Follow-Up: Please follow up as needed ADV HTN CLINIC with Dr. Raford, Reche Finder, NP or Allean Mink PharmD    Special Instructions:    Your home blood pressure cuff reads 10 points high for the systolic blood pressure (top number). The diastolic blood pressure (bottom number) is accurate.  If your blood pressure for one week is consistently more than 145/80 by your home cuff, let us  know. It is okay if it goes up to 145/80 for one day then improves.  It is normal for your heart rate to go into the 30s when you are sleeping. When we worry about this is if (1) you are having episodes of passing out or (2) your heart rate is always in the 30s. Yours is reassuring as it is low with sleep and then increases to 70-90s with activity. This is normal.

## 2024-12-09 NOTE — Progress Notes (Signed)
 "  [facility]pertension Clinic Initial Assessment:    Date:  12/09/2024   ID:  Micheal Wright, DOB 09-16-1941, MRN [id number]  PCP:  Bertrum Charlie CROME, MD  Cardiologist:  None  Nephrologist:  Referring MD: Cherrie Toribio SAUNDERS, MD   CC: Hypertension  History of Present Illness:    Micheal Wright is a 84 y.o. male with a hx of CAD s/p CABG 1998 (LHC 2022 patent graft), persistent atrial fibrillation, HTN, HLD, OSA.  here to establish care in the [facility] Clinic.   cMRI 07/07/24 with LVEF 62%, small area basal anterolateral subepicardial LGE, RV 57%, not consistent with cardiac amyloidosis.  ZIO 08/2024 with 100% afib burden (average HR 54 with range 26-164 bpm), 6 runs of VT, and 194 pauses longest 4.2 seconds. He was referred to EP, appointment upcoming.   08/2024 sleep study with no OSA.   Micheal Wright was diagnosed with hypertension >10 years ago. Notes it was well controlled on Losartan  100mg  daily until recently when it began to escalate. Dr. Bensimhon initiated Doxazosin  2mg , later increased to 4mg  daily and BP has been better controlled since that time. Reports no shortness of breath nor dyspnea on exertion. Reports no chest pain, pressure, or tightness. No edema, orthopnea, PND. Reports no palpitations. 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 Home upper arm BP cuff found to read 10 points higher systolic for manual reading and was accurate for diastolic reading. Based on this correction, BP at home most often <130/80.   He monitors HR at home with Apple Watch which he does wear with sleep. Heart rate was previously dropping in 30s with sleep. More recently in the 40s with sleep and 70-90s during daytime. Reassured him lower heart rates with sleep were normal. He is not on AV nodal blocking agent. He reports two episodes of near syncope while driving with most recent episode >1 year ago and the initial episode >1 year prior to that event. He reports no  daytime fatigue.   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    Atrial complex, premature 05/04/2014   CAD (coronary artery disease)    s/p CABG 1998; Last cath 2002 in setting of marked ST elevation during stress testing. patent grafts; Echo 8/09 EF 55%. No significant valvular disease.   Cardiac murmur 09/03/2011   Overview:  Overview:  Last Assessment & Plan:  Likely mild aortic valve sclerosis. Will check echo.     Carotid artery disease    u/s 9/10: R 40-59% L 0-39%   Carotid artery obstruction 08/22/2009   Overview:  Overview:  Qualifier: Diagnosis of  By: Cherrie, MD, CODY Toribio SAUNDERS  Last Assessment & Plan:  Asymptomatic. Due for repeat u/s.  Overview:  Overview:  Qualifier: Diagnosis of  By: Cherrie, MD, CODY Toribio SAUNDERS  Last Assessment & Plan:  Asymptomatic. Due for repeat u/s.     Carotid stenosis 08/22/2009   Qualifier: Diagnosis of  By: Cherrie, MD, CODY Toribio SAUNDERS    Cerebrovascular disease 11/17/2008   Chest tightness 01/20/2015   CHF (congestive heart failure) (HCC)    Chronic atrial fibrillation (HCC) 03/02/2018   Coronary artery disease 05/11/2007   Qualifier: Diagnosis of  By: Henrietta LPN, Regina     Erectile dysfunction    Essential hypertension 05/11/2007  Overview:  Overview:  Overview:  Qualifier: Diagnosis of  By: Henrietta LPN, Regina   Last Assessment & Plan:  BP up slightly here but when he checks at [employer] usually 120/65. Will continue current regimen.  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   Sleep apnea    Wears dentures    Full upper, partial lower   Wears hearing aid in both ears     Past Surgical History:  Procedure Laterality Date   ANGIOPLASTY     x 2   APPENDECTOMY  1960's   CARDIAC CATHETERIZATION  2002   CARDIOVERSION N/A 11/14/2015   Procedure: CARDIOVERSION;  Surgeon: Toribio JONELLE Fuel, MD;  Location: [facility] ENDOSCOPY;  Service: Cardiovascular;  Laterality: N/A;   CATARACT EXTRACTION W/PHACO Left 04/30/2022   Procedure: CATARACT EXTRACTION PHACO AND INTRAOCULAR LENS PLACEMENT (IOC) LEFT;  Surgeon: Jaye Fallow, MD;  Location: [facility] SURGERY CNTR;  Service: Ophthalmology;  Laterality: Left;  12.60 1:11.2   CATARACT EXTRACTION W/PHACO Right 05/14/2022   Procedure: CATARACT EXTRACTION PHACO AND INTRAOCULAR LENS PLACEMENT (IOC) RIGHT 12.30 00:59.3;  Surgeon: Jaye Fallow, MD;  Location: [facility] SURGERY [facility]TR;  Service: Ophthalmology;  Laterality: Right;   COLONOSCOPY W/ POLYPECTOMY     COLONOSCOPY WITH PROPOFOL  N/A 11/13/2015   Procedure: COLONOSCOPY WITH PROPOFOL ;  Surgeon: Lamar ONEIDA Holmes, MD;  Location: [facility] ENDOSCOPY;  Service: Endoscopy;  Laterality: N/A;   COLONOSCOPY WITH PROPOFOL  N/A 02/01/2019   Procedure: COLONOSCOPY WITH PROPOFOL ;  Surgeon: Holmes Lamar ONEIDA, MD;  Location: [facility] ENDOSCOPY;  Service: Endoscopy;  Laterality: N/A;   COLONOSCOPY WITH PROPOFOL  N/A 11/13/2022   Procedure: COLONOSCOPY WITH PROPOFOL ;  Surgeon: Toledo, Ladell POUR, MD;  Location: [facility] ENDOSCOPY;  Service: Gastroenterology;  Laterality: N/A;   CORONARY ARTERY BYPASS GRAFT  1998   ESOPHAGOGASTRODUODENOSCOPY  04/2004   Inflam. at Z line (biopsy neg)   ESOPHAGOGASTRODUODENOSCOPY N/A 02/01/2019   Procedure: ESOPHAGOGASTRODUODENOSCOPY (EGD);  Surgeon: Holmes Lamar ONEIDA, MD;  Location: [facility] ENDOSCOPY;  Service: Endoscopy;  Laterality: N/A;   HERPES  SIMPLEX VIRUS DFA N/A    KNEE ARTHROSCOPY W/ PARTIAL MEDIAL MENISCECTOMY  1990s   Left knee   MRI     sleep study     UVULOPALATOPHARYNGOPLASTY      Current Medications: Active Medications[1]   Allergies:   Patient has no known allergies.   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  Tobacco Use   Smoking status: Former    Passive exposure: Past   Smokeless tobacco: Never   Tobacco comments:    quit around age [age], did not inhale  Vaping Use   Vaping status: Never Used  Substance and Sexual Activity   Alcohol  use: No    Comment: I haven't drunk anything in 60 years   Drug use: No  Sexual activity: Never  Other Topics Concern   Not on file  Social History Narrative   Married with 3 children   Social Drivers of Health   Tobacco Use: Medium Risk (12/09/2024)   Patient History    Smoking Tobacco Use: Former    Smokeless Tobacco Use: Never    Passive Exposure: Past  Physicist, Medical Strain: Low Risk (12/09/2024)   Overall Financial Resource Strain (CARDIA)    Difficulty of Paying Living Expenses: Not hard at all  Food Insecurity: No Food Insecurity (12/09/2024)   Epic    Worried About Programme Researcher, Broadcasting/film/video in the Last Year: Never true    Ran Out of Food in the Last Year: Never true  Transportation Needs: No Transportation Needs (12/09/2024)   Epic    Lack of Transportation (Medical): No    Lack of Transportation (Non-Medical): No  Physical Activity: Insufficiently Active (12/09/2024)   Exercise Vital Sign    Days of Exercise per Week: 3 days    Minutes of Exercise per Session: 10 min  Stress: No Stress Concern Present (12/09/2024)   [employer] of Occupational Health - Occupational Stress Questionnaire    Feeling of Stress: Not at all  Social  Connections: Moderately Integrated (12/09/2024)   Social Connection and Isolation Panel    Frequency of Communication with Friends and Family: More than three times a week    Frequency of Social Gatherings with Friends and Family: Once a week    Attends Religious Services: More than 4 times per year    Active Member of Clubs or Organizations: No    Attends Banker Meetings: Never    Marital Status: Married  Depression (PHQ2-9): Low Risk (08/06/2022)   Depression (PHQ2-9)    PHQ-2 Score: 0  Alcohol  Screen: Low Risk (12/09/2024)   Alcohol  Screen    Last Alcohol  Screening Score (AUDIT): 0  Housing: Low Risk  (11/09/2024)   Received from [facility]   Epic    In the last 12 months, was there a time when you were not able to pay the mortgage or rent on time?: No    In the past 12 months, how many times have you moved where you were living?: 0    At any time in the past 12 months, were you homeless or living in a shelter (including now)?: No  Utilities: Not At Risk (12/09/2024)   Epic    Threatened with loss of utilities: No  Health Literacy: Adequate Health Literacy (12/09/2024)   B1300 Health Literacy    Frequency of need for help with medical instructions: Never     Family History: The patient' family history includes Alcohol  abuse in his father; Breast cancer in his maternal aunt and sister; Colon cancer in his paternal uncle; Colon polyps in his son; Heart attack in his father; Heart disease in his brother and sister; Rheumatologic disease in his brother; Stroke in his mother. There is no history of Hypertension, Diabetes, or Prostate cancer.  ROS:   Please see the history of present illness.     All other systems reviewed and are negative.  EKGs/Labs/Other Studies Reviewed:         Recent Labs: 07/05/2024: Hemoglobin 11.8; Platelets 240 09/29/2024: BUN 21; Creatinine, Ser 1.19; Potassium 4.9; Sodium 142   Recent Lipid Panel    Component Value Date/Time    CHOL 82 (L) 08/07/2022 0939   TRIG 69 08/07/2022 0939   HDL 35 (L) 08/07/2022 9060  CHOLHDL 2.3 08/07/2022 0939   CHOLHDL 2.6 Ratio 10/16/2010 2245   VLDL 15 10/16/2010 2245   LDLCALC 32 08/07/2022 0939    Physical Exam:   VS:  BP 118/68 (BP Location: Left Arm, Patient Position: Sitting, Cuff Size: Normal)   Pulse 83   Ht 5' 10 (1.778 m)   Wt 190 lb (86.2 kg)   BMI 27.26 kg/m  , BMI Body mass index is 27.26 kg/m. GENERAL:  Well appearing HEENT: Pupils equal round and reactive, fundi not visualized, oral mucosa unremarkable NECK:  No jugular venous distention, waveform within normal limits, carotid upstroke brisk and symmetric, no bruits, no thyromegaly LYMPHATICS:  No cervical adenopathy LUNGS:  Clear to auscultation bilaterally HEART:  RRR.  PMI not displaced or sustained,S1 and S2 within normal limits, no S3, no S4, no clicks, no rubs, no murmurs ABD:  Flat, positive bowel sounds normal in frequency in pitch, no bruits, no rebound, no guarding, no midline pulsatile mass, no hepatomegaly, no splenomegaly EXT:  2 plus pulses throughout, no edema, no cyanosis no clubbing SKIN:  No rashes no nodules NEURO:  Cranial nerves II through XII grossly intact, motor grossly intact throughout PSYCH:  Cognitively intact, oriented to person place and time   ASSESSMENT/PLAN:    HTN - BP at goal <130/80 on Losartan  100mg  daily, Doxazosin  4mg  daily. Continue present regimen. Home BP cuff found to read 10 points high for systolic BP, accurate diastolic readings. He was reassured. Discussed if BP by home cuff >145/80 for a week to contact us .  Recommend aiming for 150 minutes of moderate intensity activity per week and following a heart healthy diet.  He may follow up with [facility] Clinic PRN, if BP persistently elevated in future consider transition from Losartan  to Valsartan.    CAD / HLD, LDL goal <70 - Stable with no anginal symptoms. No indication for ischemic evaluation.   GDMT atorvasatin 80mg  daily. No ASA due to OAC. No BB due to bradycardia. Recommend aiming for 150 minutes of moderate intensity activity per week and following a heart healthy diet.    Permanent atrial fibrillation / hypercoagulable state / bradycardia - rate controlled today. Unaware of atrial fib. Prior sleep study with no OSA.  No AV nodal blocking agent due to bradycardia by monitor 08/2024.  ZIO 08/2024 with 100% afib burden (average HR 54 with range 26-164 bpm), 6 runs of VT, and 194 pauses longest 4.2 seconds.  Demonstrates expected lower HR with sleep. Does escalate appropriately during the daytime. Prior near syncope >1 year ago that has not recurred, no true syncope. No daytime somnolence nor lightheadedness. 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 He hopes to avoid PPM. HR by Apple Watch (also worn during sleep) HR 40s at night and up to 70-90s during day. Reassurance provided.  CHA2DS2-VASc Score = 4 [CHF History: 0, HTN History: 1, Diabetes History: 0, Stroke History: 0, Vascular Disease History: 1, Age Score: 2, Gender Score: 0].  Therefore, the patient's annual risk of stroke is 4.8 %.    Denies bleeding complications. Continue elliquis 5mg  BID. Does not meet dose reduction criteria.   Screening for Secondary Hypertension:     Relevant Labs/Studies:    Latest Ref Rng & Units 09/29/2024    1:23 PM 08/24/2024    3:20 PM 08/07/2022    9:39 AM  Basic Labs  Sodium 135 - 145 mmol/L 142  143  142   Potassium 3.5 - 5.1 mmol/L 4.9  4.7  4.8  Creatinine 0.61 - 1.24 mg/dL 8.80  8.96  8.87        Latest Ref Rng & Units 08/07/2022    9:39 AM 06/07/2021    9:22 AM  Thyroid    TSH 0.450 - 4.500 uIU/mL 1.240  1.510                    Disposition:    FU with [facility] PRN   Medication Adjustments/Labs and Tests Ordered: Current medicines are reviewed at length with the patient today.  Concerns regarding medicines are outlined above.  No orders of the defined types were  placed in this encounter.  No orders of the defined types were placed in this encounter.    Signed, Reche GORMAN Finder, NP  12/09/2024 8:16 PM    [facility]     [1]  Current Meds  Medication Sig   atorvastatin  (LIPITOR) 80 MG tablet TAKE 1 TABLET BY MOUTH EVERY DAY   Cyanocobalamin  (VITAMIN B 12 PO) Take 1 tablet by mouth daily.   doxazosin  (CARDURA ) 2 MG tablet Take 2 tablets (4 mg total) by mouth daily.   ELIQUIS  5 MG TABS tablet TAKE 1 TABLET BY MOUTH TWICE A DAY   gabapentin (NEURONTIN) 300 MG capsule Take 300 mg by mouth daily.   hydrochlorothiazide  (MICROZIDE ) 12.5 MG capsule TAKE 1 CAPSULE BY MOUTH EVERY DAY   losartan  (COZAAR ) 100 MG tablet Take 1 tablet (100 mg total) by mouth daily.   nitroGLYCERIN  (NITROSTAT ) 0.4 MG SL tablet DISSOLVE 1 TABLET UNDER THE TONGUE FOR CHEST PAIN. MAY REPEAT EVERY UP TO 3 DOSES. IF NO RELIEF, CALL 911**   omeprazole (PRILOSEC) 20 MG capsule Take 20 mg by mouth daily.   [DISCONTINUED] omeprazole (PRILOSEC) 40 MG capsule TAKE 1 CAPSULE BY MOUTH ONCE DAILY   "

## 2024-12-14 ENCOUNTER — Encounter: Payer: Self-pay | Admitting: Cardiology

## 2024-12-14 ENCOUNTER — Ambulatory Visit: Attending: Cardiology | Admitting: Cardiology

## 2024-12-14 VITALS — BP 130/58 | HR 68 | Ht 70.0 in | Wt 190.6 lb

## 2024-12-14 DIAGNOSIS — Z951 Presence of aortocoronary bypass graft: Secondary | ICD-10-CM | POA: Diagnosis not present

## 2024-12-14 DIAGNOSIS — I4821 Permanent atrial fibrillation: Secondary | ICD-10-CM | POA: Diagnosis not present

## 2024-12-14 DIAGNOSIS — D6869 Other thrombophilia: Secondary | ICD-10-CM

## 2024-12-14 NOTE — Patient Instructions (Signed)
 Medication Instructions:  Your physician recommends that you continue on your current medications as directed. Please refer to the Current Medication list given to you today.  *If you need a refill on your cardiac medications before your next appointment, please call your pharmacy*  Follow-Up: At [facility], you and your health needs are our priority.  As part of our continuing mission to provide you with exceptional heart care, our providers are all part of one team.  This team includes your primary Cardiologist (physician) and Advanced Practice Providers or APPs (Physician Assistants and Nurse Practitioners) who all work together to provide you with the care you need, when you need it.  Your next appointment:   4 months  Provider:   Ardeen Kohler, MD

## 2024-12-14 NOTE — Progress Notes (Signed)
 " Electrophysiology Office Note:   Date:  12/14/2024  ID:  Micheal Wright, DOB 11-23-1941, MRN [id number]  Primary Cardiologist: None Electrophysiologist: Fonda Kitty, MD      History of Present Illness:   Micheal Wright is a 84 y.o. male with h/o  CAD s/p CABG 1998 (LHC 2022 patent graft), persistent atrial fibrillation, HTN, HLD, OSA who is being seen today for evaluation of bradycardia.   Discussed the use of AI scribe software for clinical note transcription with the patient, who gave verbal consent to proceed.  History of Present Illness Micheal Wright is an 84 year old male with atrial fibrillation who presents with heart rate concerns based on heart rate data from a monitor and his Apple Watch.  He has a history of atrial fibrillation and is currently in permanent AFib. His heart rate concerns were initially identified using his Apple Watch, which recorded rates as low as 30-32 beats per minute during sleep. This led to discontinuation of a medication which lowered his heart rates. His heart rate has since improved to above 40 beats per minute, except for one instance when it dropped to 39.  During the day, his heart rate increases appropriately with activity, reaching up to 170 beats per minute. He does not experience dizziness, lightheadedness, or syncope during the day and can perform daily activities without limitation. His heart rate typically drops to the 40s when at rest, but he does not feel differently when this occurs.  He has been on Eliquis  to prevent stroke due to his atrial fibrillation. He continuously monitors his heart rate with his Apple Watch, which he wears all the time.  He has a history of heart surgery, during which an artery was harvested. He is active, engaging in activities such as working around the house and plans to remain active. He has been seeing Dr. Bensimhon for 15-18 years for his cardiac care.   Review of systems complete and found to be negative unless  listed in HPI.   EP Information / Studies Reviewed:    EKG is not ordered today. EKG from 11/04/24 reviewed which showed AF       Zio 09/07/24:    Cardiac MRI 07/07/24: IMPRESSION: 1. Normal LV size and wall thickness, normal wall motion with LV EF 62%.   2.  Mildly dilated RV with RV EF 57%.   3.  Biatrial enlargement.   4. Small area of basal anterolateral subepicardial LGE. This is a non-coronary pattern, perhaps prior myocarditis. This is not suggestive of cardiac amyloidosis.   5. Minimally elevated extracellular volume percentage. This is not suggestive of cardiac amyloidosis.  Risk Assessment/Calculations:    CHA2DS2-VASc Score = 4   This indicates a 4.8% annual risk of stroke. The patient's score is based upon: CHF History: 0 HTN History: 1 Diabetes History: 0 Stroke History: 0 Vascular Disease History: 1 Age Score: 2 Gender Score: 0          Physical Exam:   VS:  BP (!) 130/58 (BP Location: Right Arm, Patient Position: Sitting, Cuff Size: Normal)   Pulse 68   Ht 5' 10 (1.778 m)   Wt 190 lb 9.6 oz (86.5 kg)   SpO2 98%   BMI 27.35 kg/m    Wt Readings from Last 3 Encounters:  12/14/24 190 lb 9.6 oz (86.5 kg)  12/09/24 190 lb (86.2 kg)  11/04/24 192 lb 9.6 oz (87.4 kg)     General: Well developed, in no acute distress.  Neck: No JVD.  Cardiac: Normal rate, irregular rhythm.  Resp: Normal work of breathing.  Ext: No edema.  Neuro: No gross focal deficits.  Psych: Normal affect.    ASSESSMENT AND PLAN:    #Permanent atrial fibrillation:  #Bradycardia:  - Patient has permanent atrial fibrillation.  He is very active and denies any limitations with his functional status.  He can perform all daily activities without difficulty.  His bradycardia was initially appreciated by his Apple watch during sleep.  He does have some daytime bradycardia with rates in the 50s when at rest, but is completely asymptomatic with this.  He states that he is able to  increase his heart rate adequately with activity.  His Zio monitor results would reflect this.  He even has episodes of rapid ventricular rates, but these are rare and he is asymptomatic with this as well.  He has not had any evidence of AV block.  I informed him that while he is at high risk of needing a pacemaker eventually, but there are no hard indications at the moment if he is asymptomatic without evidence of AV block.  Another indication for pacing would be if he develops symptoms associated with rapid A-fib or had poorly controlled rates for longer periods, then pacemaker implantation for tachycardia-bradycardia syndrome would become appropriate.  For now, he will continue to closely monitor with his Apple watch and be attuned to symptoms.  Precautions were provided and he will reach out if there is any progression of his bradycardia.  #Hypercoagulable state due to AF: -Given permanent nature of his atrial fibrillation, stressed importance of oral anticoagulation for stroke prophylaxis.  Patient voiced understanding.  He will continue Eliquis .  No bleeding issues.  #CAD s/p remote CABG: Denies chest pain. -Not on aspirin due to Eliquis .  Continue atorvastatin .  Follow up with EP Team in 3 months  Signed, Fonda Kitty, MD  "

## 2025-01-24 ENCOUNTER — Institutional Professional Consult (permissible substitution) (HOSPITAL_BASED_OUTPATIENT_CLINIC_OR_DEPARTMENT_OTHER): Admitting: Family

## 2025-04-19 ENCOUNTER — Ambulatory Visit: Admitting: Cardiology
